# Patient Record
Sex: Male | Born: 1950 | Race: White | Hispanic: No | Marital: Single | Smoking: Former smoker
Health system: Southern US, Community
[De-identification: ages and names within clinical notes are randomized; demographics above are authoritative.]

## PROBLEM LIST (undated history)

## (undated) DIAGNOSIS — E119 Type 2 diabetes mellitus without complications: Secondary | ICD-10-CM

## (undated) DIAGNOSIS — F1010 Alcohol abuse, uncomplicated: Secondary | ICD-10-CM

## (undated) DIAGNOSIS — L409 Psoriasis, unspecified: Secondary | ICD-10-CM

## (undated) DIAGNOSIS — I10 Essential (primary) hypertension: Secondary | ICD-10-CM

## (undated) DIAGNOSIS — F1994 Other psychoactive substance use, unspecified with psychoactive substance-induced mood disorder: Secondary | ICD-10-CM

## (undated) DIAGNOSIS — M5430 Sciatica, unspecified side: Secondary | ICD-10-CM

## (undated) DIAGNOSIS — K047 Periapical abscess without sinus: Secondary | ICD-10-CM

## (undated) HISTORY — DX: Psoriasis, unspecified: L40.9

## (undated) HISTORY — DX: Other psychoactive substance use, unspecified with psychoactive substance-induced mood disorder: F19.94

## (undated) HISTORY — DX: Sciatica, unspecified side: M54.30

## (undated) HISTORY — DX: Type 2 diabetes mellitus without complications: E11.9

## (undated) HISTORY — DX: Alcohol abuse, uncomplicated: F10.10

## (undated) HISTORY — DX: Periapical abscess without sinus: K04.7

---
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
## 1978-04-28 HISTORY — PX: VASECTOMY: SHX75

## 2005-01-16 ENCOUNTER — Emergency Department: Payer: Self-pay | Admitting: Emergency Medicine

## 2005-01-16 ENCOUNTER — Other Ambulatory Visit: Payer: Self-pay

## 2006-12-04 ENCOUNTER — Emergency Department: Payer: Self-pay | Admitting: Emergency Medicine

## 2007-08-19 ENCOUNTER — Emergency Department: Payer: Self-pay | Admitting: Emergency Medicine

## 2007-08-24 ENCOUNTER — Emergency Department: Payer: Self-pay | Admitting: Emergency Medicine

## 2011-08-04 ENCOUNTER — Emergency Department: Payer: Self-pay

## 2011-08-04 LAB — CBC
HCT: 47.4 % (ref 40.0–52.0)
MCH: 29.8 pg (ref 26.0–34.0)
MCHC: 34 g/dL (ref 32.0–36.0)
Platelet: 249 10*3/uL (ref 150–440)
WBC: 9.9 10*3/uL (ref 3.8–10.6)

## 2011-08-04 LAB — URINALYSIS, COMPLETE
Bacteria: NONE SEEN
Bilirubin,UR: NEGATIVE
Blood: NEGATIVE
Glucose,UR: NEGATIVE mg/dL
Ketone: NEGATIVE
Leukocyte Esterase: NEGATIVE
Nitrite: NEGATIVE
Ph: 5
Protein: 100
RBC,UR: 1 /HPF
Specific Gravity: 1.021
Squamous Epithelial: NONE SEEN
WBC UR: 3 /HPF

## 2011-08-04 LAB — COMPREHENSIVE METABOLIC PANEL
Alkaline Phosphatase: 104 U/L (ref 50–136)
BUN: 15 mg/dL (ref 7–18)
Bilirubin,Total: 0.7 mg/dL (ref 0.2–1.0)
Calcium, Total: 9 mg/dL (ref 8.5–10.1)
Co2: 24 mmol/L (ref 21–32)
EGFR (African American): >60
EGFR (Non-African Amer.): >60
Osmolality: 274 (ref 275–301)
Potassium: 3.8 mmol/L (ref 3.5–5.1)
SGOT(AST): 61 U/L — ABNORMAL HIGH (ref 15–37)
SGPT (ALT): 91 U/L — ABNORMAL HIGH

## 2011-08-04 LAB — DRUG SCREEN, URINE
Amphetamines, Ur Screen: NEGATIVE (ref ?–1000)
Barbiturates, Ur Screen: NEGATIVE (ref ?–200)
Benzodiazepine, Ur Scrn: NEGATIVE (ref ?–200)
Cocaine Metabolite,Ur ~~LOC~~: NEGATIVE (ref ?–300)
MDMA (Ecstasy)Ur Screen: NEGATIVE (ref ?–500)
Methadone, Ur Screen: NEGATIVE (ref ?–300)
Opiate, Ur Screen: NEGATIVE (ref ?–300)
Phencyclidine (PCP) Ur S: NEGATIVE (ref ?–25)

## 2011-08-04 LAB — TSH: Thyroid Stimulating Horm: 3.16 u[IU]/mL

## 2011-08-04 LAB — ACETAMINOPHEN LEVEL: Acetaminophen: <2 ug/mL

## 2011-08-04 LAB — ETHANOL: Ethanol %: <0.003 % (ref 0.000–0.080)

## 2014-01-17 ENCOUNTER — Ambulatory Visit: Payer: Self-pay | Admitting: Urology

## 2014-01-26 ENCOUNTER — Ambulatory Visit: Payer: Self-pay | Admitting: Urology

## 2014-02-26 ENCOUNTER — Ambulatory Visit: Payer: Self-pay | Admitting: Urology

## 2014-05-04 DIAGNOSIS — L409 Psoriasis, unspecified: Secondary | ICD-10-CM | POA: Insufficient documentation

## 2014-06-08 LAB — HM DIABETES EYE EXAM

## 2014-06-27 LAB — LIPID PANEL
Cholesterol: 215 mg/dL — AB (ref 0–200)
HDL: 54 mg/dL (ref 35–70)
LDL Cholesterol: 142 mg/dL
Triglycerides: 97 mg/dL (ref 40–160)

## 2014-06-27 LAB — HEPATIC FUNCTION PANEL
ALK PHOS: 66 U/L (ref 25–125)
ALT: 13 U/L (ref 10–40)
AST: 16 U/L (ref 14–40)
Bilirubin, Direct: 0.21 mg/dL
Bilirubin, Total: 0.7 mg/dL

## 2014-06-27 LAB — HEMOGLOBIN A1C: Hemoglobin A1C: 6.2

## 2014-06-27 LAB — CBC AND DIFFERENTIAL
HEMATOCRIT: 43 % (ref 41–53)
HEMOGLOBIN: 14.6 g/dL (ref 13.5–17.5)
NEUTROS ABS: 5 /uL
Platelets: 275 10*3/uL (ref 150–399)
WBC: 9.2 10^3/mL

## 2014-06-27 LAB — TSH: TSH: 1.44 u[IU]/mL (ref <=5.90)

## 2014-08-29 ENCOUNTER — Ambulatory Visit: Payer: Self-pay

## 2014-08-29 DIAGNOSIS — K047 Periapical abscess without sinus: Secondary | ICD-10-CM | POA: Insufficient documentation

## 2014-08-29 HISTORY — DX: Periapical abscess without sinus: K04.7

## 2014-09-11 ENCOUNTER — Emergency Department
Admission: EM | Admit: 2014-09-11 | Discharge: 2014-09-11 | Disposition: A | Discharge status: Home / Self Care | Payer: No Typology Code available for payment source | Attending: Emergency Medicine | Admitting: Emergency Medicine

## 2014-09-11 ENCOUNTER — Encounter: Payer: Self-pay | Admitting: Emergency Medicine

## 2014-09-11 ENCOUNTER — Emergency Department: Payer: No Typology Code available for payment source

## 2014-09-11 DIAGNOSIS — S0990XA Unspecified injury of head, initial encounter: Secondary | ICD-10-CM | POA: Diagnosis present

## 2014-09-11 DIAGNOSIS — S8992XA Unspecified injury of left lower leg, initial encounter: Secondary | ICD-10-CM | POA: Diagnosis not present

## 2014-09-11 DIAGNOSIS — Z79899 Other long term (current) drug therapy: Secondary | ICD-10-CM | POA: Insufficient documentation

## 2014-09-11 DIAGNOSIS — Y92410 Unspecified street and highway as the place of occurrence of the external cause: Secondary | ICD-10-CM | POA: Diagnosis not present

## 2014-09-11 DIAGNOSIS — Y998 Other external cause status: Secondary | ICD-10-CM | POA: Diagnosis not present

## 2014-09-11 DIAGNOSIS — G8929 Other chronic pain: Secondary | ICD-10-CM | POA: Insufficient documentation

## 2014-09-11 DIAGNOSIS — Y9389 Activity, other specified: Secondary | ICD-10-CM | POA: Diagnosis not present

## 2014-09-11 DIAGNOSIS — S0003XA Contusion of scalp, initial encounter: Secondary | ICD-10-CM

## 2014-09-11 DIAGNOSIS — S3992XA Unspecified injury of lower back, initial encounter: Secondary | ICD-10-CM | POA: Insufficient documentation

## 2014-09-11 DIAGNOSIS — Z791 Long term (current) use of non-steroidal anti-inflammatories (NSAID): Secondary | ICD-10-CM | POA: Diagnosis not present

## 2014-09-11 DIAGNOSIS — Z87891 Personal history of nicotine dependence: Secondary | ICD-10-CM | POA: Diagnosis not present

## 2014-09-11 DIAGNOSIS — I10 Essential (primary) hypertension: Secondary | ICD-10-CM | POA: Diagnosis not present

## 2014-09-11 HISTORY — DX: Essential (primary) hypertension: I10

## 2014-09-11 MED ORDER — NAPROXEN 500 MG PO TABS: 500.0000 mg | ORAL_TABLET | Freq: Two times a day (BID) | ORAL | Status: AC

## 2014-09-11 MED ORDER — CYCLOBENZAPRINE HCL 10 MG PO TABS: 10.0000 mg | ORAL_TABLET | Freq: Three times a day (TID) | ORAL | Status: DC | PRN
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
## 2014-10-10 ENCOUNTER — Other Ambulatory Visit: Payer: Self-pay

## 2014-10-17 ENCOUNTER — Other Ambulatory Visit: Payer: Self-pay

## 2014-10-19 ENCOUNTER — Ambulatory Visit: Payer: Self-pay

## 2015-03-01 ENCOUNTER — Ambulatory Visit: Payer: Self-pay

## 2015-03-06 ENCOUNTER — Ambulatory Visit: Payer: Self-pay

## 2015-03-06 DIAGNOSIS — I129 Hypertensive chronic kidney disease with stage 1 through stage 4 chronic kidney disease, or unspecified chronic kidney disease: Secondary | ICD-10-CM | POA: Insufficient documentation

## 2015-04-03 ENCOUNTER — Other Ambulatory Visit: Payer: Self-pay

## 2015-04-03 LAB — BASIC METABOLIC PANEL
Creatinine: 1.4 mg/dL — AB (ref 0.6–1.3)
Glucose: 141 mg/dL
Potassium: 4.1 mmol/L (ref 3.4–5.3)
Sodium: 137 mmol/L (ref 137–147)

## 2015-04-11 ENCOUNTER — Ambulatory Visit: Payer: Self-pay | Admitting: Internal Medicine

## 2015-06-08 DIAGNOSIS — L409 Psoriasis, unspecified: Secondary | ICD-10-CM

## 2015-06-08 DIAGNOSIS — K047 Periapical abscess without sinus: Secondary | ICD-10-CM

## 2015-06-08 DIAGNOSIS — I10 Essential (primary) hypertension: Secondary | ICD-10-CM

## 2015-06-27 ENCOUNTER — Telehealth: Payer: Self-pay

## 2015-06-27 NOTE — Telephone Encounter (Signed)
Dr. Candelaria Stagers will not be here on 07/11/15 and we need to reschedule to another day.

## 2015-06-28 NOTE — Telephone Encounter (Signed)
Rescheduled 3/15 apt for 3/29. Reminded pt of 3/8 labs

## 2015-07-04 ENCOUNTER — Other Ambulatory Visit: Payer: Self-pay

## 2015-07-11 ENCOUNTER — Ambulatory Visit: Payer: Self-pay | Admitting: Internal Medicine

## 2015-07-25 ENCOUNTER — Ambulatory Visit: Payer: Self-pay | Admitting: Internal Medicine

## 2015-08-02 ENCOUNTER — Telehealth: Payer: Self-pay

## 2015-08-02 NOTE — Telephone Encounter (Signed)
Pt lm to resched missed lab appt and also wants to sched office visit  [phone]

## 2015-08-02 NOTE — Telephone Encounter (Signed)
Lm to schedule appointments [phone]

## 2015-08-03 NOTE — Telephone Encounter (Signed)
Lm to schedule appointments [phone] 

## 2015-11-21 ENCOUNTER — Ambulatory Visit: Payer: Self-pay | Admitting: Internal Medicine

## 2015-11-21 VITALS — BP 144/97 | HR 88 | Temp 97.0°F | Wt 216.0 lb

## 2015-11-21 DIAGNOSIS — E119 Type 2 diabetes mellitus without complications: Secondary | ICD-10-CM

## 2015-11-21 NOTE — Progress Notes (Signed)
   Subjective:    Patient ID: David Blackburn, male    DOB: 05-Jan-1951, 65 y.o.   MRN: [id number]  HPI  Patient Active Problem List   Diagnosis Date Noted  . HTN (hypertension) 03/06/2015  . Dental abscess 08/29/2014  . Psoriasis 05/04/2014   Patient states he is eating better and no longer using alcohol.  Patient is no longer taking his insulin.  He is taking gabapentin for leg cramps.  Review of Systems Patient's blood glucose levels are high.  Blood pressure is elevated. Rechecked with reading of 120/80 Patients feet were checked.  Good pulses.    Objective:   Physical Exam  Constitutional: He is oriented to person, place, and time.  Cardiovascular: Normal rate, regular rhythm and normal heart sounds.   Pulmonary/Chest: Effort normal and breath sounds normal.  Neurological: He is alert and oriented to person, place, and time.      Medication List       Accurate as of 11/21/15 11:03 AM. Always use your most recent med list.          aspirin 81 MG tablet Take 81 mg by mouth as needed for pain.   Cinnamon 500 MG capsule Take 500 mg by mouth daily.   cyclobenzaprine 10 MG tablet Commonly known as:  FLEXERIL Take 1 tablet (10 mg total) by mouth 3 (three) times daily as needed for muscle spasms.   FLAX SEEDS PO Take by mouth.   FOLIC ACID PO Take by mouth.   gabapentin 300 MG capsule Commonly known as:  NEURONTIN Take 300 mg by mouth 3 (three) times daily.   geriatric multivitamins-minerals Elix Take 15 mLs by mouth daily.   MULTIVITAMIN ADULT PO Take by mouth.   lisinopril 10 MG tablet Commonly known as:  PRINIVIL,ZESTRIL Take 10 mg by mouth daily.   NIACIN (ANTIHYPERLIPIDEMIC) PO Take by mouth.   VITAMIN D (CHOLECALCIFEROL) PO Take by mouth.      BP (!) 144/97   Pulse 88   Temp 97 F (36.1 C)   Wt 216 lb (98 kg)   BMI 31.90 kg/m        Assessment & Plan:  Patient needs to eat healthy to see if glucose levels are lower.  He is to return  in 3 weeks with labs prior to determine course medications for diabetes. MetC, CBC, A1C, Lipid, TSH, UA, Hep C

## 2015-11-21 NOTE — Patient Instructions (Signed)
MetC, CBC, A1C, Lipid, TSH, UA, Hep C

## 2015-12-12 ENCOUNTER — Other Ambulatory Visit: Payer: Self-pay

## 2015-12-12 DIAGNOSIS — E119 Type 2 diabetes mellitus without complications: Secondary | ICD-10-CM

## 2015-12-14 LAB — CBC WITH DIFFERENTIAL/PLATELET
BASOS ABS: 0.1 10*3/uL (ref 0.0–0.2)
Basos: 1 %
EOS (ABSOLUTE): 0.2 10*3/uL (ref 0.0–0.4)
Eos: 3 %
Hematocrit: 48.9 % (ref 37.5–51.0)
Hemoglobin: 16.6 g/dL (ref 12.6–17.7)
Immature Grans (Abs): 0 10*3/uL (ref 0.0–0.1)
Immature Granulocytes: 0 %
LYMPHS ABS: 2.4 10*3/uL (ref 0.7–3.1)
Lymphs: 32 %
MCH: 29.9 pg (ref 26.6–33.0)
MCHC: 33.9 g/dL (ref 31.5–35.7)
MCV: 88 fL (ref 79–97)
Monocytes Absolute: 0.5 10*3/uL (ref 0.1–0.9)
Monocytes: 7 %
Neutrophils Absolute: 4.3 10*3/uL (ref 1.4–7.0)
Neutrophils: 57 %
Platelets: 250 10*3/uL (ref 150–379)
RBC: 5.55 x10E6/uL (ref 4.14–5.80)
RDW: 14.1 % (ref 12.3–15.4)
WBC: 7.5 10*3/uL (ref 3.4–10.8)

## 2015-12-14 LAB — URINALYSIS
Bilirubin, UA: NEGATIVE
KETONES UA: NEGATIVE
LEUKOCYTES UA: NEGATIVE
Nitrite, UA: NEGATIVE
RBC UA: NEGATIVE
UUROB: 0.2 mg/dL (ref 0.2–1.0)
pH, UA: 5.5 (ref 5.0–7.5)

## 2015-12-14 LAB — COMPREHENSIVE METABOLIC PANEL
ALK PHOS: 140 IU/L — AB (ref 39–117)
ALT: 283 IU/L — ABNORMAL HIGH (ref 0–44)
AST: 242 IU/L — ABNORMAL HIGH (ref 0–40)
Albumin/Globulin Ratio: 1.7 (ref 1.2–2.2)
Albumin: 4.8 g/dL (ref 3.6–4.8)
BILIRUBIN TOTAL: 0.9 mg/dL (ref 0.0–1.2)
BUN / CREAT RATIO: 13 (ref 10–24)
BUN: 15 mg/dL (ref 8–27)
CHLORIDE: 94 mmol/L — AB (ref 96–106)
CO2: 21 mmol/L (ref 18–29)
Calcium: 9.8 mg/dL (ref 8.6–10.2)
Creatinine, Ser: 1.13 mg/dL (ref 0.76–1.27)
GFR calc Af Amer: 80 mL/min/{1.73_m2} (ref >59)
GFR calc non Af Amer: 69 mL/min/{1.73_m2} (ref >59)
GLOBULIN, TOTAL: 2.9 g/dL (ref 1.5–4.5)
GLUCOSE: 325 mg/dL — AB (ref 65–99)
Potassium: 4.6 mmol/L (ref 3.5–5.2)
SODIUM: 134 mmol/L (ref 134–144)
Total Protein: 7.7 g/dL (ref 6.0–8.5)

## 2015-12-14 LAB — HEMOGLOBIN A1C
Est. average glucose Bld gHb Est-mCnc: 349 mg/dL
Hgb A1c MFr Bld: 13.8 % — ABNORMAL HIGH (ref 4.8–5.6)

## 2015-12-14 LAB — LIPID PANEL
Chol/HDL Ratio: 10.2 ratio units — ABNORMAL HIGH (ref 0.0–5.0)
Cholesterol, Total: 266 mg/dL — ABNORMAL HIGH (ref 100–199)
HDL: 26 mg/dL — ABNORMAL LOW (ref >39)
LDL Calculated: 184 mg/dL — ABNORMAL HIGH (ref 0–99)
Triglycerides: 281 mg/dL — ABNORMAL HIGH (ref 0–149)
VLDL CHOLESTEROL CAL: 56 mg/dL — AB (ref 5–40)

## 2015-12-14 LAB — TSH: TSH: 1.81 u[IU]/mL (ref 0.450–4.500)

## 2015-12-14 LAB — HEPATITIS C ANTIBODY: Hep C Virus Ab: <0.1 s/co ratio (ref 0.0–0.9)

## 2015-12-19 ENCOUNTER — Encounter: Payer: Self-pay | Admitting: Internal Medicine

## 2015-12-19 ENCOUNTER — Ambulatory Visit: Payer: Self-pay | Admitting: Internal Medicine

## 2015-12-19 VITALS — BP 112/78 | HR 87 | Temp 97.9°F | Wt 212.0 lb

## 2015-12-19 DIAGNOSIS — E119 Type 2 diabetes mellitus without complications: Secondary | ICD-10-CM

## 2015-12-19 LAB — GLUCOSE, POCT (MANUAL RESULT ENTRY): POC Glucose: 361 mg/dl — AB (ref 70–99)

## 2015-12-19 MED ORDER — LISINOPRIL 10 MG PO TABS: 20.0000 mg | ORAL_TABLET | Freq: Every day | ORAL | 2 refills | Status: DC

## 2015-12-19 MED ORDER — INSULIN DETEMIR 100 UNIT/ML FLEXPEN: 8.0000 [IU] | PEN_INJECTOR | Freq: Every day | SUBCUTANEOUS | 2 refills | Status: DC

## 2015-12-19 MED ORDER — GABAPENTIN 300 MG PO CAPS: 300.0000 mg | ORAL_CAPSULE | Freq: Three times a day (TID) | ORAL | 6 refills | Status: DC

## 2015-12-19 NOTE — Patient Instructions (Signed)
F/u in 6 weeks w/ labs: A1C, POTC glucose, and Liver A panel

## 2015-12-19 NOTE — Progress Notes (Signed)
   Subjective:    Patient ID: David Blackburn, male    DOB: 06/17/1950, 65 y.o.   MRN: [id number]  HPI   Pt. Presents w/ f/u for diabetes. Glucose is elevated to 361. A1C elevated to 13.8. Reports has checked sugars before in evening. BP is stable. In past has responded to insulin therapy.    Pt. Reports frequent alcohol consumption. Liver functions are elevated and appears to be relatively new.  Hep C is negative.  Reports consistent coughing related to BP medication.   Patient Active Problem List   Diagnosis Date Noted  . HTN (hypertension) 03/06/2015  . Dental abscess 08/29/2014  . Psoriasis 05/04/2014     Medication List       Accurate as of 12/19/15 10:29 AM. Always use your most recent med list.          aspirin 81 MG tablet Take 81 mg by mouth as needed for pain.   Cinnamon 500 MG capsule Take 500 mg by mouth daily.   cyclobenzaprine 10 MG tablet Commonly known as:  FLEXERIL Take 1 tablet (10 mg total) by mouth 3 (three) times daily as needed for muscle spasms.   FLAX SEEDS PO Take by mouth.   FOLIC ACID PO Take by mouth.   gabapentin 300 MG capsule Commonly known as:  NEURONTIN Take 300 mg by mouth 3 (three) times daily.   geriatric multivitamins-minerals Elix Take 15 mLs by mouth daily.   MULTIVITAMIN ADULT PO Take by mouth.   lisinopril 10 MG tablet Commonly known as:  PRINIVIL,ZESTRIL Take 10 mg by mouth daily.   NIACIN (ANTIHYPERLIPIDEMIC) PO Take by mouth.   VITAMIN D (CHOLECALCIFEROL) PO Take by mouth.        Review of Systems     Objective:   Physical Exam  Constitutional: He is oriented to person, place, and time.  Cardiovascular: Normal rate, regular rhythm and normal heart sounds.   Pulmonary/Chest: Effort normal and breath sounds normal.  Neurological: He is alert and oriented to person, place, and time.    BP 112/78   Pulse 87   Temp 97.9 F (36.6 C)   Wt 212 lb (96.2 kg)   BMI 31.31 kg/m        Assessment & Plan:    Restart insulin at 8 units nightly.  F/u in 6 weeks w/ labs: A1C, random sugar, liver A panel  Pt. Advised to decrease alcohol consumption.

## 2016-01-30 ENCOUNTER — Ambulatory Visit: Payer: Self-pay | Admitting: Internal Medicine

## 2016-01-30 ENCOUNTER — Encounter: Payer: Self-pay | Admitting: Internal Medicine

## 2016-01-30 VITALS — BP 124/85 | HR 76 | Temp 97.4°F | Wt 214.0 lb

## 2016-01-30 DIAGNOSIS — E119 Type 2 diabetes mellitus without complications: Secondary | ICD-10-CM

## 2016-01-30 DIAGNOSIS — M5441 Lumbago with sciatica, right side: Secondary | ICD-10-CM

## 2016-01-30 DIAGNOSIS — Z794 Long term (current) use of insulin: Principal | ICD-10-CM

## 2016-01-30 DIAGNOSIS — M5442 Lumbago with sciatica, left side: Secondary | ICD-10-CM

## 2016-01-30 LAB — GLUCOSE, POCT (MANUAL RESULT ENTRY): POC GLUCOSE: 261 mg/dL — AB (ref 70–99)

## 2016-01-30 MED ORDER — GABAPENTIN 300 MG PO CAPS: 300.0000 mg | ORAL_CAPSULE | Freq: Three times a day (TID) | ORAL | 6 refills | Status: DC

## 2016-01-30 MED ORDER — LISINOPRIL 10 MG PO TABS: 20.0000 mg | ORAL_TABLET | Freq: Every day | ORAL | 2 refills | Status: DC

## 2016-01-30 MED ORDER — INSULIN DETEMIR 100 UNIT/ML FLEXPEN: 8.0000 [IU] | PEN_INJECTOR | Freq: Every day | SUBCUTANEOUS | 2 refills | Status: DC

## 2016-01-30 NOTE — Progress Notes (Signed)
   Subjective:    Patient ID: David Blackburn, male    DOB: 03/18/1951, 65 y.o.   MRN: [id number]  Diabetes   Leg Pain     Pt f/u for diabetes and leg pain. Reports checking blood glucose and reports it in the range of 200-300. Reports not taking insulin b/c did not get refill. Blood glucose elevated today at 261. Reports recent increase in consumption of alcohol which is apparent in lab results in last 3 mnths. 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 Pt reports previous diagnosis of bipolar disease.    Patient Active Problem List   Diagnosis Date Noted  . HTN (hypertension) 03/06/2015  . Dental abscess 08/29/2014  . Psoriasis 05/04/2014     Medication List       Accurate as of 01/30/16 11:04 AM. Always use your most recent med list.          aspirin 81 MG tablet Take 81 mg by mouth as needed for pain.   Cinnamon 500 MG capsule Take 500 mg by mouth daily.   cyclobenzaprine 10 MG tablet Commonly known as:  FLEXERIL Take 1 tablet (10 mg total) by mouth 3 (three) times daily as needed for muscle spasms.   FLAX SEEDS PO Take by mouth.   FOLIC ACID PO Take by mouth.   gabapentin 300 MG capsule Commonly known as:  NEURONTIN Take 1 capsule (300 mg total) by mouth 3 (three) times daily.   geriatric multivitamins-minerals Elix Take 15 mLs by mouth daily.   MULTIVITAMIN ADULT PO Take by mouth.   Insulin Detemir 100 UNIT/ML Pen Commonly known as:  LEVEMIR FLEXPEN Inject 8 Units into the skin daily at 10 pm.   lisinopril 10 MG tablet Commonly known as:  PRINIVIL,ZESTRIL Take 2 tablets (20 mg total) by mouth daily.   NIACIN (ANTIHYPERLIPIDEMIC) PO Take by mouth as needed.   VITAMIN D (CHOLECALCIFEROL) PO Take by mouth.        Review of Systems     Objective:   Physical Exam  Constitutional: He is oriented to person, place, and time.  Cardiovascular: Normal rate, regular rhythm and normal heart sounds.   Pulmonary/Chest: Effort normal and breath sounds  normal.  Neurological: He is alert and oriented to person, place, and time.    BP 124/85   Pulse 76   Temp 97.4 F (36.3 C) (Oral)   Wt 214 lb (97.1 kg)   BMI 31.60 kg/m        Assessment & Plan:   Pt advised on reducing alcohol consumption.  Referral to orthopedic for back pain. Referral for lumbar sacral x-ray for back pain and sciatica.  F/u in 3 mnths w/ labs: A1C, MetC, CBC, Lipid

## 2016-01-30 NOTE — Patient Instructions (Signed)
Referral to orthopedic for back pain. Referral for lumbar sacral x-ray for back pain and sciatica.  F/u in 3 mnths w/ labs: A1C, MetC, CBC, Lipid

## 2016-02-06 ENCOUNTER — Encounter: Payer: Self-pay | Admitting: Specialist

## 2016-02-07 ENCOUNTER — Other Ambulatory Visit: Payer: Self-pay | Admitting: Specialist

## 2016-02-07 DIAGNOSIS — M5416 Radiculopathy, lumbar region: Secondary | ICD-10-CM

## 2016-02-20 ENCOUNTER — Ambulatory Visit: Payer: No Typology Code available for payment source

## 2016-02-21 ENCOUNTER — Ambulatory Visit: Payer: Self-pay

## 2016-03-19 ENCOUNTER — Other Ambulatory Visit: Payer: Self-pay

## 2016-03-19 DIAGNOSIS — I10 Essential (primary) hypertension: Secondary | ICD-10-CM

## 2016-03-19 MED ORDER — LISINOPRIL 10 MG PO TABS: 20.0000 mg | ORAL_TABLET | Freq: Every day | ORAL | 2 refills | Status: DC

## 2016-03-19 MED ORDER — GABAPENTIN 300 MG PO CAPS: 300.0000 mg | ORAL_CAPSULE | Freq: Three times a day (TID) | ORAL | 6 refills | Status: DC

## 2016-04-30 ENCOUNTER — Other Ambulatory Visit: Payer: Self-pay

## 2016-04-30 DIAGNOSIS — E119 Type 2 diabetes mellitus without complications: Secondary | ICD-10-CM

## 2016-04-30 DIAGNOSIS — Z794 Long term (current) use of insulin: Secondary | ICD-10-CM

## 2016-05-01 LAB — CBC
Hematocrit: 44 % (ref 37.5–51.0)
Hemoglobin: 15.2 g/dL (ref 13.0–17.7)
MCH: 30.2 pg (ref 26.6–33.0)
MCHC: 34.5 g/dL (ref 31.5–35.7)
MCV: 87 fL (ref 79–97)
PLATELETS: 224 10*3/uL (ref 150–379)
RBC: 5.04 x10E6/uL (ref 4.14–5.80)
RDW: 14 % (ref 12.3–15.4)
WBC: 8.1 10*3/uL (ref 3.4–10.8)

## 2016-05-01 LAB — COMPREHENSIVE METABOLIC PANEL
ALBUMIN: 4.7 g/dL (ref 3.6–4.8)
ALT: 129 IU/L — ABNORMAL HIGH (ref 0–44)
AST: 105 IU/L — ABNORMAL HIGH (ref 0–40)
Albumin/Globulin Ratio: 2 (ref 1.2–2.2)
Alkaline Phosphatase: 91 IU/L (ref 39–117)
BUN/Creatinine Ratio: 16 (ref 10–24)
BUN: 14 mg/dL (ref 8–27)
Bilirubin Total: 1.2 mg/dL (ref 0.0–1.2)
CALCIUM: 9.7 mg/dL (ref 8.6–10.2)
CO2: 20 mmol/L (ref 18–29)
CREATININE: 0.87 mg/dL (ref 0.76–1.27)
Chloride: 100 mmol/L (ref 96–106)
GFR, EST AFRICAN AMERICAN: 106 mL/min/{1.73_m2} (ref >59)
GFR, EST NON AFRICAN AMERICAN: 92 mL/min/{1.73_m2} (ref >59)
GLOBULIN, TOTAL: 2.4 g/dL (ref 1.5–4.5)
GLUCOSE: 139 mg/dL — AB (ref 65–99)
Potassium: 4.1 mmol/L (ref 3.5–5.2)
SODIUM: 139 mmol/L (ref 134–144)
TOTAL PROTEIN: 7.1 g/dL (ref 6.0–8.5)

## 2016-05-01 LAB — LIPID PANEL
CHOLESTEROL TOTAL: 225 mg/dL — AB (ref 100–199)
Chol/HDL Ratio: 7.3 ratio units — ABNORMAL HIGH (ref 0.0–5.0)
HDL: 31 mg/dL — ABNORMAL LOW (ref >39)
LDL Calculated: 158 mg/dL — ABNORMAL HIGH (ref 0–99)
Triglycerides: 181 mg/dL — ABNORMAL HIGH (ref 0–149)
VLDL CHOLESTEROL CAL: 36 mg/dL (ref 5–40)

## 2016-05-01 LAB — HEMOGLOBIN A1C
ESTIMATED AVERAGE GLUCOSE: 189 mg/dL
HEMOGLOBIN A1C: 8.2 % — AB (ref 4.8–5.6)

## 2016-05-01 LAB — HEPATIC FUNCTION PANEL: Bilirubin, Direct: 0.26 mg/dL (ref 0.00–0.40)

## 2016-05-07 ENCOUNTER — Encounter: Payer: Self-pay | Admitting: Internal Medicine

## 2016-05-07 ENCOUNTER — Ambulatory Visit: Payer: Self-pay | Admitting: Internal Medicine

## 2016-05-07 VITALS — BP 145/87 | HR 71 | Temp 97.7°F | Wt 210.0 lb

## 2016-05-07 DIAGNOSIS — I10 Essential (primary) hypertension: Secondary | ICD-10-CM

## 2016-05-07 DIAGNOSIS — Z794 Long term (current) use of insulin: Principal | ICD-10-CM

## 2016-05-07 DIAGNOSIS — E119 Type 2 diabetes mellitus without complications: Secondary | ICD-10-CM

## 2016-05-07 MED ORDER — LISINOPRIL 10 MG PO TABS: 20.0000 mg | ORAL_TABLET | Freq: Every day | ORAL | 7 refills | Status: DC

## 2016-05-07 MED ORDER — GABAPENTIN 300 MG PO CAPS: 300.0000 mg | ORAL_CAPSULE | Freq: Three times a day (TID) | ORAL | 11 refills | Status: DC

## 2016-05-07 NOTE — Patient Instructions (Signed)
F/u in 6 months w/ labs 

## 2016-05-07 NOTE — Progress Notes (Signed)
  Subjective:    Patient ID: David Blackburn, male    DOB: 07/13/1950, 66 y.o.   MRN: [id number]  HPI   Pt f/u for lab work. A1C has decreased to 8.   Patient Active Problem List   Diagnosis Date Noted  . HTN (hypertension) 03/06/2015  . Dental abscess 08/29/2014  . Psoriasis 05/04/2014   Allergies as of 05/07/2016   No Known Allergies     Medication List       Accurate as of 05/07/16 10:26 AM. Always use your most recent med list.          aspirin 81 MG tablet Take 81 mg by mouth as needed for pain.   Cinnamon 500 MG capsule Take 500 mg by mouth daily.   cyclobenzaprine 10 MG tablet Commonly known as:  FLEXERIL Take 1 tablet (10 mg total) by mouth 3 (three) times daily as needed for muscle spasms.   FLAX SEEDS PO Take by mouth.   FOLIC ACID PO Take by mouth.   gabapentin 300 MG capsule Commonly known as:  NEURONTIN Take 1 capsule (300 mg total) by mouth 3 (three) times daily.   geriatric multivitamins-minerals Elix Take 15 mLs by mouth daily.   MULTIVITAMIN ADULT PO Take by mouth.   Insulin Detemir 100 UNIT/ML Pen Commonly known as:  LEVEMIR FLEXPEN Inject 8 Units into the skin daily at 10 pm.   lisinopril 10 MG tablet Commonly known as:  PRINIVIL,ZESTRIL Take 2 tablets (20 mg total) by mouth daily.   NIACIN (ANTIHYPERLIPIDEMIC) PO Take by mouth as needed.   VITAMIN D (CHOLECALCIFEROL) PO Take by mouth.         Review of Systems     Objective:   Physical Exam  Constitutional: He is oriented to person, place, and time.  Cardiovascular: Normal rate, regular rhythm and normal heart sounds.   Pulmonary/Chest: Effort normal and breath sounds normal.  Neurological: He is alert and oriented to person, place, and time.    BP (!) 145/87   Pulse 71   Temp 97.7 F (36.5 C) (Oral)   Wt 210 lb (95.3 kg)   BMI 31.01 kg/m        Assessment & Plan:   F/u in 6 months w/ labs: Met C, CBC, A1C, Lipid, Ua, MicroAlbumin, PSA, TSH

## 2016-11-05 ENCOUNTER — Ambulatory Visit: Payer: Self-pay | Admitting: Internal Medicine

## 2016-11-05 ENCOUNTER — Encounter: Payer: Self-pay | Admitting: Internal Medicine

## 2016-11-05 VITALS — BP 155/107 | HR 73 | Temp 97.6°F | Wt 210.8 lb

## 2016-11-05 DIAGNOSIS — I10 Essential (primary) hypertension: Secondary | ICD-10-CM

## 2016-11-05 MED ORDER — GABAPENTIN 300 MG PO CAPS: 300.0000 mg | ORAL_CAPSULE | Freq: Three times a day (TID) | ORAL | 11 refills | Status: DC

## 2016-11-05 MED ORDER — LOSARTAN POTASSIUM 50 MG PO TABS: 50.0000 mg | ORAL_TABLET | Freq: Every day | ORAL | 11 refills | Status: DC

## 2016-11-05 NOTE — Progress Notes (Signed)
   Subjective:    Patient ID: David Blackburn, male    DOB: 02/04/1951, 66 y.o.   MRN: [id number]  HPI   Pt reports he stopped taking Lisinopril due to persistent cough      Patient Active Problem List   Diagnosis Date Noted  . HTN (hypertension) 03/06/2015  . Dental abscess 08/29/2014  . Psoriasis 05/04/2014    Allergies as of 11/05/2016   No Known Allergies     Medication List       Accurate as of 11/05/16  9:27 AM. Always use your most recent med list.          aspirin 81 MG tablet Take 81 mg by mouth as needed for pain.   Cinnamon 500 MG capsule Take 500 mg by mouth daily.   cyclobenzaprine 10 MG tablet Commonly known as:  FLEXERIL Take 1 tablet (10 mg total) by mouth 3 (three) times daily as needed for muscle spasms.   FLAX SEEDS PO Take by mouth.   FLUVIRIN Susp Generic drug:  Influenza Vac Typ A&B Surf Ant ADM 4.1HR IM UTD   FOLIC ACID PO Take by mouth.   gabapentin 300 MG capsule Commonly known as:  NEURONTIN Take 1 capsule (300 mg total) by mouth 3 (three) times daily.   geriatric multivitamins-minerals Elix Take 15 mLs by mouth daily.   MULTIVITAMIN ADULT PO Take by mouth.   Insulin Detemir 100 UNIT/ML Pen Commonly known as:  LEVEMIR FLEXPEN Inject 8 Units into the skin daily at 10 pm.   lisinopril 10 MG tablet Commonly known as:  PRINIVIL,ZESTRIL Take 2 tablets (20 mg total) by mouth daily.   NIACIN (ANTIHYPERLIPIDEMIC) PO Take by mouth as needed.   VITAMIN D (CHOLECALCIFEROL) PO Take by mouth.         Review of Systems     Objective:   Physical Exam  Constitutional: He is oriented to person, place, and time.  Cardiovascular: Normal rate, regular rhythm and normal heart sounds.   Pulmonary/Chest: Effort normal and breath sounds normal.  Neurological: He is alert and oriented to person, place, and time.     BP (!) 155/107 (BP Location: Left Arm, Patient Position: Sitting, Cuff Size: Normal)   Pulse 73   Temp 97.6 F  (36.4 C)   Wt 210 lb 12.8 oz (95.6 kg)   BMI 31.13 kg/m        Assessment & Plan:   Start taking Losartin '50mg'$  once a day  Continue taking Gabapentin  Labs Today: A1C, Met C, CBC, UA, Lipid, TSH, PSA    Follow up in 3-4 wks

## 2016-11-06 LAB — COMPREHENSIVE METABOLIC PANEL
ALBUMIN: 4.5 g/dL (ref 3.6–4.8)
ALT: 43 IU/L (ref 0–44)
AST: 35 IU/L (ref 0–40)
Albumin/Globulin Ratio: 1.7 (ref 1.2–2.2)
Alkaline Phosphatase: 114 IU/L (ref 39–117)
BUN/Creatinine Ratio: 10 (ref 10–24)
BUN: 11 mg/dL (ref 8–27)
Bilirubin Total: 0.6 mg/dL (ref 0.0–1.2)
CALCIUM: 9.9 mg/dL (ref 8.6–10.2)
CO2: 23 mmol/L (ref 20–29)
CREATININE: 1.12 mg/dL (ref 0.76–1.27)
Chloride: 100 mmol/L (ref 96–106)
GFR calc Af Amer: 80 mL/min/{1.73_m2} (ref >59)
GFR calc non Af Amer: 69 mL/min/{1.73_m2} (ref >59)
GLOBULIN, TOTAL: 2.7 g/dL (ref 1.5–4.5)
Glucose: 140 mg/dL — ABNORMAL HIGH (ref 65–99)
Potassium: 4.6 mmol/L (ref 3.5–5.2)
SODIUM: 141 mmol/L (ref 134–144)
Total Protein: 7.2 g/dL (ref 6.0–8.5)

## 2016-11-06 LAB — CBC
HEMATOCRIT: 46.8 % (ref 37.5–51.0)
HEMOGLOBIN: 16 g/dL (ref 13.0–17.7)
MCH: 29.2 pg (ref 26.6–33.0)
MCHC: 34.2 g/dL (ref 31.5–35.7)
MCV: 85 fL (ref 79–97)
PLATELETS: 258 10*3/uL (ref 150–379)
RBC: 5.48 x10E6/uL (ref 4.14–5.80)
RDW: 12.9 % (ref 12.3–15.4)
WBC: 7.5 10*3/uL (ref 3.4–10.8)

## 2016-11-06 LAB — URINALYSIS
Bilirubin, UA: NEGATIVE
Glucose, UA: NEGATIVE
Ketones, UA: NEGATIVE
LEUKOCYTES UA: NEGATIVE
NITRITE UA: NEGATIVE
PH UA: 5 (ref 5.0–7.5)
RBC, UA: NEGATIVE
Specific Gravity, UA: 1.02 (ref 1.005–1.030)
Urobilinogen, Ur: 0.2 mg/dL (ref 0.2–1.0)

## 2016-11-06 LAB — TSH: TSH: 2.5 u[IU]/mL (ref 0.450–4.500)

## 2016-11-06 LAB — LIPID PANEL
CHOL/HDL RATIO: 5.4 ratio — AB (ref 0.0–5.0)
Cholesterol, Total: 193 mg/dL (ref 100–199)
HDL: 36 mg/dL — AB (ref >39)
LDL CALC: 125 mg/dL — AB (ref 0–99)
TRIGLYCERIDES: 158 mg/dL — AB (ref 0–149)
VLDL CHOLESTEROL CAL: 32 mg/dL (ref 5–40)

## 2016-11-06 LAB — HEMOGLOBIN A1C
Est. average glucose Bld gHb Est-mCnc: 163 mg/dL
Hgb A1c MFr Bld: 7.3 % — ABNORMAL HIGH (ref 4.8–5.6)

## 2016-11-06 LAB — PSA: Prostate Specific Ag, Serum: 1.1 ng/mL (ref 0.0–4.0)

## 2016-12-24 ENCOUNTER — Encounter: Payer: Self-pay | Admitting: Internal Medicine

## 2016-12-24 ENCOUNTER — Ambulatory Visit: Payer: Self-pay | Admitting: Internal Medicine

## 2016-12-24 ENCOUNTER — Ambulatory Visit: Payer: Self-pay | Admitting: Specialist

## 2016-12-24 VITALS — BP 158/99 | HR 71 | Temp 98.1°F | Wt 209.2 lb

## 2016-12-24 DIAGNOSIS — E119 Type 2 diabetes mellitus without complications: Secondary | ICD-10-CM

## 2016-12-24 DIAGNOSIS — M25559 Pain in unspecified hip: Secondary | ICD-10-CM

## 2016-12-24 LAB — POCT GLUCOSE (DEVICE FOR HOME USE): POC Glucose: 116 mg/dl — AB (ref 70–99)

## 2016-12-24 NOTE — Progress Notes (Signed)
M  Subjective:    Patient ID: Lorie Phenixshley Dewberry, male    DOB: 10/30/1950, 66 y.o.   MRN: [id number]  HPI I saw Mr. Corine ShelterWatkins 02/06/16. In reviewing my note, I was more interested in his low back. He was not using a cane at that time, since then he has seen Dr. Jonnie FinnerNaylor who xrayed lumbar spine and L hip. 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 LS spine shows multi level DJD.  He now uses a single point cane in alternating hands.   Review of Systems     Objective:   Physical Exam   Without cane, he has a marked antalgic gait, he almost hops. With cane in Rt hand, it is somewhat better. He is able to stand on toes, barely on his heels.   In supine position, He lacks 10 degrees of full extension. Flexion: to 40 degrees. In prone position, on R 20 degrees ER. 20 degrees IR; on L, 30 degrees ER contracture, with no further rotation possible.       Assessment & Plan:  IMP: severe DJD L hip. [facility]

## 2016-12-24 NOTE — Patient Instructions (Signed)
Follow up in 6 months with labs a week prior

## 2016-12-24 NOTE — Progress Notes (Signed)
   Subjective:    Patient ID: David Blackburn, male    DOB: 08-Apr-1951, 66 y.o.   MRN: [id number]  HPI   Pt reports being under a significant amount of stress. He has been checking his BP regularly with a sleeve. Pt reports that systolic pressure readings are low, but he thinks that the monitor may be incorrect.   Pt is interested in receiving a prescription for anxiety.   Patient Active Problem List   Diagnosis Date Noted  . HTN (hypertension) 03/06/2015  . Dental abscess 08/29/2014  . Psoriasis 05/04/2014   Allergies as of 12/24/2016   No Known Allergies     Medication List       Accurate as of 12/24/16  9:47 AM. Always use your most recent med list.          aspirin 81 MG tablet Take 81 mg by mouth as needed for pain.   Cinnamon 500 MG capsule Take 500 mg by mouth daily.   cyclobenzaprine 10 MG tablet Commonly known as:  FLEXERIL Take 1 tablet (10 mg total) by mouth 3 (three) times daily as needed for muscle spasms.   FLAX SEEDS PO Take by mouth.   FLUVIRIN Susp Generic drug:  Influenza Vac Typ A&B Surf Ant ADM 0.5ML IM UTD   FOLIC ACID PO Take by mouth.   gabapentin 300 MG capsule Commonly known as:  NEURONTIN Take 1 capsule (300 mg total) by mouth 3 (three) times daily.   geriatric multivitamins-minerals Elix Take 15 mLs by mouth daily.   MULTIVITAMIN ADULT PO Take by mouth.   Insulin Detemir 100 UNIT/ML Pen Commonly known as:  LEVEMIR FLEXPEN Inject 8 Units into the skin daily at 10 pm.   [name] tablet Commonly known as:  COZAAR Take 1 tablet (50 mg total) by mouth daily.   NIACIN (ANTIHYPERLIPIDEMIC) PO Take by mouth as needed.   VITAMIN D (CHOLECALCIFEROL) PO Take by mouth.        Review of Systems     Objective:   Physical Exam  BP (!) 158/99 (BP Location: Left Arm)   Pulse 71   Temp 98.1 F (36.7 C)   Wt 209 lb 3.2 oz (94.9 kg)   BMI 30.89 kg/m   Manual BP recorded; 138/84     Assessment & Plan:    F/U in 6  months with labs a week prior  Provide referral for mental health screening/services for anxiety

## 2016-12-25 ENCOUNTER — Ambulatory Visit: Payer: Self-pay | Admitting: Licensed Clinical Social Worker

## 2017-01-30 ENCOUNTER — Emergency Department
Admission: EM | Admit: 2017-01-30 | Discharge: 2017-01-30 | Disposition: A | Discharge status: Home / Self Care | Payer: Medicaid Other | Attending: Emergency Medicine | Admitting: Emergency Medicine

## 2017-01-30 ENCOUNTER — Encounter: Payer: Self-pay | Admitting: Emergency Medicine

## 2017-01-30 DIAGNOSIS — F1994 Other psychoactive substance use, unspecified with psychoactive substance-induced mood disorder: Secondary | ICD-10-CM

## 2017-01-30 DIAGNOSIS — I10 Essential (primary) hypertension: Secondary | ICD-10-CM | POA: Diagnosis not present

## 2017-01-30 DIAGNOSIS — F3181 Bipolar II disorder: Secondary | ICD-10-CM

## 2017-01-30 DIAGNOSIS — Y906 Blood alcohol level of 120-199 mg/100 ml: Secondary | ICD-10-CM | POA: Insufficient documentation

## 2017-01-30 DIAGNOSIS — Z7982 Long term (current) use of aspirin: Secondary | ICD-10-CM | POA: Diagnosis not present

## 2017-01-30 DIAGNOSIS — Z046 Encounter for general psychiatric examination, requested by authority: Secondary | ICD-10-CM | POA: Diagnosis present

## 2017-01-30 DIAGNOSIS — R45851 Suicidal ideations: Secondary | ICD-10-CM | POA: Insufficient documentation

## 2017-01-30 DIAGNOSIS — Z79899 Other long term (current) drug therapy: Secondary | ICD-10-CM | POA: Insufficient documentation

## 2017-01-30 DIAGNOSIS — E119 Type 2 diabetes mellitus without complications: Secondary | ICD-10-CM | POA: Insufficient documentation

## 2017-01-30 DIAGNOSIS — F1010 Alcohol abuse, uncomplicated: Secondary | ICD-10-CM | POA: Insufficient documentation

## 2017-01-30 DIAGNOSIS — Z87891 Personal history of nicotine dependence: Secondary | ICD-10-CM | POA: Insufficient documentation

## 2017-01-30 HISTORY — DX: Other psychoactive substance use, unspecified with psychoactive substance-induced mood disorder: F19.94

## 2017-01-30 HISTORY — DX: Alcohol abuse, uncomplicated: F10.10

## 2017-01-30 LAB — CBC
HEMATOCRIT: 48.1 % (ref 40.0–52.0)
HEMOGLOBIN: 16.6 g/dL (ref 13.0–18.0)
MCH: 29.7 pg (ref 26.0–34.0)
MCHC: 34.4 g/dL (ref 32.0–36.0)
MCV: 86.3 fL (ref 80.0–100.0)
Platelets: 267 10*3/uL (ref 150–440)
RBC: 5.57 MIL/uL (ref 4.40–5.90)
RDW: 15.1 % — ABNORMAL HIGH (ref 11.5–14.5)
WBC: 10.4 10*3/uL (ref 3.8–10.6)

## 2017-01-30 LAB — COMPREHENSIVE METABOLIC PANEL
ALK PHOS: 100 U/L (ref 38–126)
ALT: 54 U/L (ref 17–63)
AST: 50 U/L — ABNORMAL HIGH (ref 15–41)
Albumin: 4.9 g/dL (ref 3.5–5.0)
Anion gap: 15 (ref 5–15)
BUN: 17 mg/dL (ref 6–20)
CALCIUM: 10.1 mg/dL (ref 8.9–10.3)
CO2: 23 mmol/L (ref 22–32)
CREATININE: 1.06 mg/dL (ref 0.61–1.24)
Chloride: 103 mmol/L (ref 101–111)
GFR calc non Af Amer: >60 mL/min (ref >60)
GLUCOSE: 149 mg/dL — AB (ref 65–99)
Potassium: 4.1 mmol/L (ref 3.5–5.1)
SODIUM: 141 mmol/L (ref 135–145)
Total Bilirubin: 1.2 mg/dL (ref 0.3–1.2)
Total Protein: 8.8 g/dL — ABNORMAL HIGH (ref 6.5–8.1)

## 2017-01-30 LAB — URINE DRUG SCREEN, QUALITATIVE (ARMC ONLY)
Amphetamines, Ur Screen: NOT DETECTED
Barbiturates, Ur Screen: NOT DETECTED
Benzodiazepine, Ur Scrn: NOT DETECTED
CANNABINOID 50 NG, UR ~~LOC~~: NOT DETECTED
COCAINE METABOLITE, UR ~~LOC~~: NOT DETECTED
MDMA (Ecstasy)Ur Screen: NOT DETECTED
Methadone Scn, Ur: NOT DETECTED
OPIATE, UR SCREEN: NOT DETECTED
Phencyclidine (PCP) Ur S: NOT DETECTED
Tricyclic, Ur Screen: NOT DETECTED

## 2017-01-30 LAB — ACETAMINOPHEN LEVEL: Acetaminophen (Tylenol), Serum: <10 ug/mL — ABNORMAL LOW (ref 10–30)

## 2017-01-30 LAB — SALICYLATE LEVEL

## 2017-01-30 LAB — ETHANOL: Alcohol, Ethyl (B): 191 mg/dL — ABNORMAL HIGH (ref <10)

## 2017-01-30 MED ORDER — LORAZEPAM 2 MG/ML IJ SOLN: 0.0000 mg | Freq: Four times a day (QID) | INTRAMUSCULAR | Status: DC

## 2017-01-30 MED ORDER — LORAZEPAM 2 MG PO TABS: 0.0000 mg | ORAL_TABLET | Freq: Four times a day (QID) | ORAL | Status: DC

## 2017-01-30 MED ORDER — VITAMIN B-1 100 MG PO TABS
100.0000 mg | ORAL_TABLET | Freq: Every day | ORAL | Status: DC
  Administered 2017-01-30: 100 mg via ORAL
  Filled 2017-01-30: qty 1

## 2017-01-30 MED ORDER — THIAMINE HCL 100 MG/ML IJ SOLN: 100.0000 mg | Freq: Every day | INTRAMUSCULAR | Status: DC

## 2017-01-30 MED ORDER — LORAZEPAM 2 MG PO TABS: 0.0000 mg | ORAL_TABLET | Freq: Two times a day (BID) | ORAL | Status: DC

## 2017-01-30 MED ORDER — LORAZEPAM 2 MG/ML IJ SOLN: 0.0000 mg | Freq: Two times a day (BID) | INTRAMUSCULAR | Status: DC
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
## 2017-01-30 NOTE — ED Provider Notes (Signed)
[facility] Emergency Department Provider Note  ____________________________________________   First MD Initiated Contact with Patient 01/30/17 0505     (approximate)  I have reviewed the triage vital signs and the nursing notes.   HISTORY  Chief Complaint Suicidal    HPI David Blackburn is a 66 y.o. male with medical history as listed below who presents for evaluation of suicidal ideation.  He was brought in by Patent examiner with IVC papers in place.  He admits to drinking a large amount of alcohol daily, and states that he Had about half a bottle of rum over the course of this past day (half of a 750 mL bottle).  He is very EKG with his responses and will not answer questions directly.  He was very clear about his intention to kill himself to law enforcement stating that he would cut himself with a razor blade or hang himself cord, but to me he states that that was all a joke and that he would not kill himself.  Although he does appear slightly intoxicated he is coherent and able to deflect questioning away from answering the questions he is asked.  He denies fever/chills, chest pain, shortness of breath, nausea, vomiting, and abdominal pain.  He states he does not regularly do drugs but will "do anything I can get my hands on".  He denies smoking.   Past Medical History:  Diagnosis Date  . Diabetes mellitus without complication (HCC)   . Hypertension   . Psoriasis   . Sciatica     Patient Active Problem List   Diagnosis Date Noted  . HTN (hypertension) 03/06/2015  . Dental abscess 08/29/2014  . Psoriasis 05/04/2014    Past Surgical History:  Procedure Laterality Date  . TONSILLECTOMY  1961  . VASECTOMY  1980    Prior to Admission medications   Medication Sig Start Date End Date Taking? Authorizing Provider  aspirin 81 MG tablet Take 81 mg by mouth as needed for pain.    [provider]  Cinnamon 500 MG capsule Take 500 mg by mouth  daily.    [provider]  cyclobenzaprine (FLEXERIL) 10 MG tablet Take 1 tablet (10 mg total) by mouth 3 (three) times daily as needed for muscle spasms. Patient not taking: Reported on 05/07/2016 09/11/14   Triplett, Rulon Eisenmenger B, FNP  Flaxseed, Linseed, (FLAX SEEDS PO) Take by mouth.    [provider]  Jodie Echevaria ADM 0.5ML IM UTD 02/08/16   [provider]  FOLIC ACID PO Take by mouth.    [provider]  gabapentin (NEURONTIN) 300 MG capsule Take 1 capsule (300 mg total) by mouth 3 (three) times daily. 11/05/16   Virl Axe, MD  geriatric multivitamins-minerals (ELDERTONIC/GEVRABON) ELIX Take 15 mLs by mouth daily.    [provider]  Insulin Detemir (LEVEMIR FLEXPEN) 100 UNIT/ML Pen Inject 8 Units into the skin daily at 10 pm. Patient not taking: Reported on 05/07/2016 01/30/16   Virl Axe, MD  losartan (COZAAR) 50 MG tablet Take 1 tablet (50 mg total) by mouth daily. 11/05/16   Virl Axe, MD  Multiple Vitamins-Minerals (MULTIVITAMIN ADULT PO) Take by mouth.    [provider]  NIACIN, ANTIHYPERLIPIDEMIC, PO Take by mouth as needed.     [provider]  VITAMIN D, CHOLECALCIFEROL, PO Take by mouth.    [provider]    Allergies Patient has no known allergies.  Family History  Problem Relation Age  of Onset  . Lung cancer Mother   . Hypertension Mother     Social History Social History  Substance Use Topics  . Smoking status: Former Smoker    Packs/day: 0.01    Types: Cigars    Quit date: 06/08/2007  . Smokeless tobacco: Former Neurosurgeon     Comment: occassionally a cigar  . Alcohol use Yes     Comment: "sometimes a bottle of wine in a few hours"     Review of Systems Constitutional: No fever/chills Eyes: No visual changes. ENT: No sore throat. Cardiovascular: Denies chest pain. Respiratory: Denies shortness of breath. Gastrointestinal: No abdominal pain.  No nausea, no vomiting.  No diarrhea.  No  constipation. Genitourinary: Negative for dysuria. Musculoskeletal: Negative for neck pain.  Negative for back pain. Integumentary: Negative for rash. Neurological: Negative for headaches, focal weakness or numbness. Psychiatric:Reportedly endorsing suicidality  ____________________________________________   PHYSICAL EXAM:  VITAL SIGNS: ED Triage Vitals  Enc Vitals Group     BP 01/30/17 0424 (!) 180/83     Pulse Rate 01/30/17 0424 77     Resp 01/30/17 0424 17     Temp --      Temp src --      SpO2 01/30/17 0424 99 %     Weight 01/30/17 0425 94.8 kg (209 lb)     Height --      Head Circumference --      Peak Flow --      Pain Score --      Pain Loc --      Pain Edu? --      Excl. in GC? --     Constitutional: Alert and oriented. Does appear slightly intoxicated Eyes: Conjunctivae are normal.  Head: Atraumatic. Cardiovascular: Normal rate, regular rhythm. Good peripheral circulation. Grossly normal heart sounds. Respiratory: Normal respiratory effort.  No retractions. Lungs CTAB. Gastrointestinal: Soft and nontender. No distention.  Musculoskeletal: No lower extremity tenderness nor edema. No gross deformities of extremities. Neurologic:  Normal speech and language although he appears slightly intoxicated. No gross focal neurologic deficits are appreciated.  Skin:  Skin is warm, dry and intact. No rash noted. Psychiatric: Mood and affect are normal. Deflects questions to avoid answering directly.  Endorsed suicidality to law enforcement but denies it to me.  ____________________________________________   LABS (all labs ordered are listed, but only abnormal results are displayed)  Labs Reviewed  COMPREHENSIVE METABOLIC PANEL - Abnormal; Notable for the following:       Result Value   Glucose, Bld 149 (*)    Total Protein 8.8 (*)    AST 50 (*)    All other components within normal limits  ETHANOL - Abnormal; Notable for the following:    Alcohol, Ethyl (B) 191 (*)     All other components within normal limits  ACETAMINOPHEN LEVEL - Abnormal; Notable for the following:    Acetaminophen (Tylenol), Serum <10 (*)    All other components within normal limits  CBC - Abnormal; Notable for the following:    RDW 15.1 (*)    All other components within normal limits  SALICYLATE LEVEL  URINE DRUG SCREEN, QUALITATIVE ([facility] ONLY)   ____________________________________________  EKG  None - EKG not ordered by ED physician ____________________________________________  RADIOLOGY   No results found.  ____________________________________________   PROCEDURES  Critical Care performed: No   Procedure(s) performed:   Procedures   ____________________________________________   INITIAL IMPRESSION / ASSESSMENT AND PLAN / ED COURSE  As  part of my medical decision making, I reviewed the following data within the electronic MEDICAL RECORD NUMBER Labs reviewed [facility] Course for details)  and Notes from prior ED visits    the patient is in no acute distress.  Most likely he has limited judgment due to alcohol intoxication but I will maintain his involuntary commitment at this time until he can sober up and be evaluated by psychiatry.  I put him on CIWA protocol.  Clinical Course as of Jan 31 728  Caleen Essex Jan 30, 2017  4098 lab work is notable for an ethanol level of 191, otherwise his lab work is unremarkable.  We are still awaiting a urine specimen for urine drug screen.  [CF]    Clinical Course User Index [CF] Loleta Rose, MD    ____________________________________________  FINAL CLINICAL IMPRESSION(S) / ED DIAGNOSES  Final diagnoses:  Alcohol abuse  Suicidal ideation     MEDICATIONS GIVEN DURING THIS VISIT:  Medications  LORazepam (ATIVAN) injection 0-4 mg (not administered)    Or  LORazepam (ATIVAN) tablet 0-4 mg (not administered)  LORazepam (ATIVAN) injection 0-4 mg (not administered)    Or  LORazepam (ATIVAN) tablet 0-4 mg (not  administered)  thiamine (VITAMIN B-1) tablet 100 mg (not administered)    Or  thiamine (B-1) injection 100 mg (not administered)     NEW OUTPATIENT MEDICATIONS STARTED DURING THIS VISIT:  New Prescriptions   No medications on file    Modified Medications   No medications on file    Discontinued Medications   No medications on file     Note:  This document was prepared using Dragon voice recognition software and may include unintentional dictation errors.    Loleta Rose, MD 01/30/17 [phone]

## 2017-01-30 NOTE — ED Notes (Signed)
Pt was discharged to lobby where he was to await courtesy ride to bus. Pt signed for discharge and said he was eager to leave. He denied SI, HI, and AVH. He indicated he had received all belongings, but his memory of arrival was poor. Pt did not have a shirt or shoes. Scrubs and booties given. Pt was given a bus pass because he could not find someone by phone to pick him up. He was in no acute distress.

## 2017-01-30 NOTE — ED Notes (Signed)
ED Provider at bedside. 

## 2017-01-30 NOTE — ED Notes (Signed)
Pt asked if MD would wake him when he made rounds. Pt is eager to speak with MD, as he hopes to go home. Pt denies SI, HI, and AVH. Will continue to monitor for needs/safety.

## 2017-01-30 NOTE — ED Provider Notes (Signed)
Patient cleared for discharge by Dr. Luz Lex, MD 01/30/17 [phone]

## 2017-01-30 NOTE — Consult Note (Signed)
[facility] Consult   Reason for Consult:  Consult for 66 year old man with a history of alcohol abuse and mood symptoms who was brought in by police last night Referring Physician:  Corky Downs Patient Identification: David Blackburn MRN:  [id number] Principal Diagnosis: Substance induced mood disorder ([place]) Diagnosis:   Patient Active Problem List   Diagnosis Date Noted  . Substance induced mood disorder ([place]) [F19.94] 01/30/2017  . Alcohol abuse [F10.10] 01/30/2017  . Bipolar 2 disorder ([place]) [F31.81] 01/30/2017  . HTN (hypertension) [I10] 03/06/2015  . Dental abscess [K04.7] 08/29/2014  . Psoriasis [L40.9] 05/04/2014    Total Time spent with patient: 1 hour  Subjective:   David Blackburn is a 66 y.o. male patient admitted with "I had too much to drink".  HPI:  66 year old man brought in under IVC. Police reported that they had received a call stating that the patient was making harassing phone calls to one of his neighbors. When they came to check on him he made statements about how he was going to hurt himself. Patient was intoxicated when he was brought into the emergency room. He is now sober on interview. He admits to making statements about hurting himself. He says he knows that was in poor judgment and it was just because he was drunk. He says he had no actual thought or intention of hurting himself. He also admits that he had made phone calls to his neighbor. By his report this is a woman with whom he has had a long-standing relationship which is labile in nature. He says that he hadn't talked to her in a while and gave her a call when he was drunk. He denies having any thoughts at all about hurting her or doing anything violent. He says overall his mood recently has been a little bit more down. He admits that he's been drinking more frequently probably getting to the point of intoxication at least once a week. He is not getting any outpatient mental health or psychiatric  treatment. Sleep pattern is stable. Denies any hallucinations or psychotic symptoms.  Social history: Patient lives by himself. He has an adult son living in [place] with whom he has only occasional contact. No other family locally.  Medical history: High blood pressure psoriasis and history of some noncompliance with treatment.  Substance abuse history: Long-standing alcohol abuse. No history of seizures or delirium tremens. He says he has had extended periods of sobriety in the past and has been involved in outpatient substance abuse treatment but is not currently going. Denies any other drug use  Past Psychiatric History: Patient has been given a diagnosis of bipolar disorder and reports having been admitted to hospitals about 30 years ago. Had another hospitalization after that. Had been on lithium about 30 years ago but never liked taking it. Hasn't been on any psychiatric medicine in years. He denies any history of suicide attempts or violence.  Risk to Self: Suicidal Ideation: Yes-Currently Present Suicidal Intent: No Is patient at risk for suicide?: No Suicidal Plan?: No Access to Means: No What has been your use of drugs/alcohol within the last 12 months?: 750  ml rum Other Self Harm Risks: none identified Triggers for Past Attempts: None known Intentional Self Injurious Behavior: None Risk to Others: Homicidal Ideation: No Thoughts of Harm to Others: No Current Homicidal Intent: No Current Homicidal Plan: No Access to Homicidal Means: No Identified Victim: none identified History of harm to others?: No Assessment of Violence: None Noted Violent Behavior Description:  none identified Does patient have access to weapons?: No Criminal Charges Pending?: No Does patient have a court date: No Prior Inpatient Therapy: Prior Inpatient Therapy: Yes Prior Therapy Dates: unknown Prior Therapy Facilty/Provider(s): [facility] Reason for Treatment: depression Prior Outpatient Therapy:  Prior Outpatient Therapy: No Prior Therapy Dates: n/a Prior Therapy Facilty/Provider(s): n/a Reason for Treatment: n/a Does patient have an ACCT team?: Unknown Does patient have Intensive In-House Services?  : No Does patient have [facility] services? : Unknown Does patient have P4CC services?: Unknown  Past Medical History:  Past Medical History:  Diagnosis Date  . Diabetes mellitus without complication ([place])   . Hypertension   . Psoriasis   . Sciatica     Past Surgical History:  Procedure Laterality Date  . TONSILLECTOMY  1961  . VASECTOMY  1980   Family History:  Family History  Problem Relation Age of Onset  . Lung cancer Mother   . Hypertension Mother    Family Psychiatric  History: Not aware of any family history Social History:  History  Alcohol Use  . Yes    Comment: "sometimes a bottle of wine in a few hours"      History  Drug use: Unknown    Social History   Social History  . Marital status: Single    Spouse name: N/A  . Number of children: N/A  . Years of education: N/A   Social History Main Topics  . Smoking status: Former Smoker    Packs/day: 0.01    Types: Cigars    Quit date: 06/08/2007  . Smokeless tobacco: Former Systems developer     Comment: occassionally a cigar  . Alcohol use Yes     Comment: "sometimes a bottle of wine in a few hours"   . Drug use: Unknown  . Sexual activity: Not Asked   Other Topics Concern  . None   Social History Narrative  . None   Additional Social History:    Allergies:  No Known Allergies  Labs:  Results for orders placed or performed during the hospital encounter of 01/30/17 (from the past 48 hour(s))  Comprehensive metabolic panel     Status: Abnormal   Collection Time: 01/30/17  4:26 AM  Result Value Ref Range   Sodium 141 135 - 145 mmol/L   Potassium 4.1 3.5 - 5.1 mmol/L   Chloride 103 101 - 111 mmol/L   CO2 23 22 - 32 mmol/L   Glucose, Bld 149 (H) 65 - 99 mg/dL   BUN 17 6 - 20 mg/dL   Creatinine, Ser  1.06 0.61 - 1.24 mg/dL   Calcium 10.1 8.9 - 10.3 mg/dL   Total Protein 8.8 (H) 6.5 - 8.1 g/dL   Albumin 4.9 3.5 - 5.0 g/dL   AST 50 (H) 15 - 41 U/L   ALT 54 17 - 63 U/L   Alkaline Phosphatase 100 38 - 126 U/L   Total Bilirubin 1.2 0.3 - 1.2 mg/dL   GFR calc non Af Amer >60 >60 mL/min   GFR calc Af Amer >60 >60 mL/min    Comment: (NOTE) The eGFR has been calculated using the CKD EPI equation. This calculation has not been validated in all clinical situations. eGFR's persistently <60 mL/min signify possible Chronic Kidney Disease.    Anion gap 15 5 - 15  Ethanol     Status: Abnormal   Collection Time: 01/30/17  4:26 AM  Result Value Ref Range   Alcohol, Ethyl (B) 191 (H) <10 mg/dL  Comment:        LOWEST DETECTABLE LIMIT FOR SERUM ALCOHOL IS 10 mg/dL FOR MEDICAL PURPOSES ONLY Please note change in reference range.   Salicylate level     Status: None   Collection Time: 01/30/17  4:26 AM  Result Value Ref Range   Salicylate Lvl <1.6 2.8 - 30.0 mg/dL  Acetaminophen level     Status: Abnormal   Collection Time: 01/30/17  4:26 AM  Result Value Ref Range   Acetaminophen (Tylenol), Serum <10 (L) 10 - 30 ug/mL    Comment:        THERAPEUTIC CONCENTRATIONS VARY SIGNIFICANTLY. A RANGE OF 10-30 ug/mL MAY BE AN EFFECTIVE CONCENTRATION FOR MANY PATIENTS. HOWEVER, SOME ARE BEST TREATED AT CONCENTRATIONS OUTSIDE THIS RANGE. ACETAMINOPHEN CONCENTRATIONS >150 ug/mL AT 4 HOURS AFTER INGESTION AND >50 ug/mL AT 12 HOURS AFTER INGESTION ARE OFTEN ASSOCIATED WITH TOXIC REACTIONS.   cbc     Status: Abnormal   Collection Time: 01/30/17  4:26 AM  Result Value Ref Range   WBC 10.4 3.8 - 10.6 K/uL   RBC 5.57 4.40 - 5.90 MIL/uL   Hemoglobin 16.6 13.0 - 18.0 g/dL   HCT 48.1 40.0 - 52.0 %   MCV 86.3 80.0 - 100.0 fL   MCH 29.7 26.0 - 34.0 pg   MCHC 34.4 32.0 - 36.0 g/dL   RDW 15.1 (H) 11.5 - 14.5 %   Platelets 267 150 - 440 K/uL  Urine Drug Screen, Qualitative     Status: None    Collection Time: 01/30/17 11:39 AM  Result Value Ref Range   Tricyclic, Ur Screen NONE DETECTED NONE DETECTED   Amphetamines, Ur Screen NONE DETECTED NONE DETECTED   MDMA (Ecstasy)Ur Screen NONE DETECTED NONE DETECTED   Cocaine Metabolite,Ur [place] NONE DETECTED NONE DETECTED   Opiate, Ur Screen NONE DETECTED NONE DETECTED   Phencyclidine (PCP) Ur S NONE DETECTED NONE DETECTED   Cannabinoid 50 Ng, Ur [place] NONE DETECTED NONE DETECTED   Barbiturates, Ur Screen NONE DETECTED NONE DETECTED   Benzodiazepine, Ur Scrn NONE DETECTED NONE DETECTED   Methadone Scn, Ur NONE DETECTED NONE DETECTED    Comment: (NOTE) 109  Tricyclics, urine               Cutoff 1000 ng/mL 200  Amphetamines, urine             Cutoff 1000 ng/mL 300  MDMA (Ecstasy), urine           Cutoff 500 ng/mL 400  Cocaine Metabolite, urine       Cutoff 300 ng/mL 500  Opiate, urine                   Cutoff 300 ng/mL 600  Phencyclidine (PCP), urine      Cutoff 25 ng/mL 700  Cannabinoid, urine              Cutoff 50 ng/mL 800  Barbiturates, urine             Cutoff 200 ng/mL 900  Benzodiazepine, urine           Cutoff 200 ng/mL 1000 Methadone, urine                Cutoff 300 ng/mL 1100 1200 The urine drug screen provides only a preliminary, unconfirmed 1300 analytical test result and should not be used for non-medical 1400 purposes. Clinical consideration and professional judgment should 1500 be applied to any positive drug screen result due to possible 1600  interfering substances. A more specific alternate chemical method 1700 must be used in order to obtain a confirmed analytical result.  1800 Gas chromato graphy / mass spectrometry (GC/MS) is the preferred 1900 confirmatory method.     Current Facility-Administered Medications  Medication Dose Route Frequency Provider Last Rate Last Dose  . LORazepam (ATIVAN) injection 0-4 mg  0-4 mg Intravenous Q6H Hinda Kehr, MD       Or  . LORazepam (ATIVAN) tablet 0-4 mg  0-4 mg Oral  Q6H Hinda Kehr, MD      . Derrill Memo ON 02/01/2017] LORazepam (ATIVAN) injection 0-4 mg  0-4 mg Intravenous Q12H Hinda Kehr, MD       Or  . Derrill Memo ON 02/01/2017] LORazepam (ATIVAN) tablet 0-4 mg  0-4 mg Oral Q12H Hinda Kehr, MD      . thiamine (VITAMIN B-1) tablet 100 mg  100 mg Oral Daily Hinda Kehr, MD   100 mg at 01/30/17 1610   Or  . thiamine (B-1) injection 100 mg  100 mg Intravenous Daily Hinda Kehr, MD       Current Outpatient Prescriptions  Medication Sig Dispense Refill  . aspirin 81 MG tablet Take 81 mg by mouth as needed for pain.    . Cinnamon 500 MG capsule Take 500 mg by mouth daily.    . cyclobenzaprine (FLEXERIL) 10 MG tablet Take 1 tablet (10 mg total) by mouth 3 (three) times daily as needed for muscle spasms. (Patient not taking: Reported on 05/07/2016) 30 tablet 0  . Flaxseed, Linseed, (FLAX SEEDS PO) Take by mouth.    Marland Kitchen FLUVIRIN SUSP ADM 0.5ML IM UTD  0  . FOLIC ACID PO Take by mouth.    . gabapentin (NEURONTIN) 300 MG capsule Take 1 capsule (300 mg total) by mouth 3 (three) times daily. 90 capsule 11  . geriatric multivitamins-minerals (ELDERTONIC/GEVRABON) ELIX Take 15 mLs by mouth daily.    . Insulin Detemir (LEVEMIR FLEXPEN) 100 UNIT/ML Pen Inject 8 Units into the skin daily at 10 pm. (Patient not taking: Reported on 05/07/2016) 2.4 mL 2  . losartan (COZAAR) 50 MG tablet Take 1 tablet (50 mg total) by mouth daily. 30 tablet 11  . Multiple Vitamins-Minerals (MULTIVITAMIN ADULT PO) Take by mouth.    Marland Kitchen NIACIN, ANTIHYPERLIPIDEMIC, PO Take by mouth as needed.     Marland Kitchen VITAMIN D, CHOLECALCIFEROL, PO Take by mouth.      Musculoskeletal: Strength & Muscle Tone: within normal limits Gait & Station: normal Patient leans: N/A  Psychiatric Specialty Exam: Physical Exam  Nursing note and vitals reviewed. Constitutional: He appears well-developed and well-nourished.  HENT:  Head: Normocephalic and atraumatic.  Eyes: Pupils are equal, round, and reactive to light.  Conjunctivae are normal.  Neck: Normal range of motion.  Cardiovascular: Regular rhythm and normal heart sounds.   Respiratory: Effort normal. No respiratory distress.  GI: Soft.  Musculoskeletal: Normal range of motion.  Neurological: He is alert.  Skin: Skin is warm and dry.  Psychiatric: He has a normal mood and affect. His speech is normal and behavior is normal. Judgment and thought content normal. Cognition and memory are normal.    Review of Systems  Constitutional: Negative.   HENT: Negative.   Eyes: Negative.   Respiratory: Negative.   Cardiovascular: Negative.   Gastrointestinal: Negative.   Musculoskeletal: Negative.   Skin: Negative.   Neurological: Negative.   Psychiatric/Behavioral: Positive for substance abuse. Negative for depression, hallucinations, memory loss and suicidal ideas. The patient is not nervous/anxious  and does not have insomnia.     Blood pressure 139/84, pulse 70, temperature 97.9 F (36.6 C), temperature source Oral, resp. rate 17, weight 94.8 kg (209 lb), SpO2 96 %.Body mass index is 30.86 kg/m.  General Appearance: Casual  Eye Contact:  Good  Speech:  Clear and Coherent  Volume:  Increased  Mood:  Euthymic  Affect:  Appropriate  Thought Process:  Disorganized  Orientation:  Full (Time, Place, and Person)  Thought Content:  Logical, Rumination and Tangential  Suicidal Thoughts:  No  Homicidal Thoughts:  No  Memory:  Immediate;   Good Recent;   Fair Remote;   Fair  Judgement:  Fair  Insight:  Fair  Psychomotor Activity:  Normal  Concentration:  Concentration: Fair  Recall:  [place] of Knowledge:  Poor  Language:  Fair  Akathisia:  No  Handed:  Right  AIMS (if indicated):     Assets:  Desire for Improvement Housing Resilience  ADL's:  Intact  Cognition:  WNL  Sleep:        Treatment Plan Summary: Plan 66 year old man with alcohol abuse and a past history of bipolar disorder. Currently he is sober and presents as pleasant  and charming in a way that may indicate some degree of hypomania. Probably has bipolar too. Patient doesn't show any sign of dangerousness and does not meet commitment criteria. Discontinue IVC. Strongly encourage patient to discontinue alcohol use and consider follow-up with local mental health. He will be given information about [facility] at discharge. Case reviewed with the ER physician and TTS.  Disposition: Patient does not meet criteria for psychiatric inpatient admission. Supportive therapy provided about ongoing stressors.  Alethia Berthold, MD 01/30/2017 3:19 PM

## 2017-01-30 NOTE — BH Assessment (Signed)
Assessment Note  Shivansh Kutch is an 66 y.o. male presenting to the ED, via [place] [employer], under IVC due to threatening to kill his neighbor and himself.  Patient also presents intoxicated.  BAC is33 129Western SaWebster County Memorial [id number](2Scientist, phyHaxtun HoDarin EngeAdventist Health Sonora RegionUpper Connecticut Valley Hospitala130Va [place] 77H19eaWestern SaWhittier Hospital Medical [id number] Vall(2Scientist, phyRocky Mountain Laser AndDarin EngeNort13086KelSpecialty Surgical Center Of Thousand Oaks LPli7e684CWestern SaNapa State Hospit130403-7313130869629Cornerstone Hosp(2Scientist, phyThe Maryland Filutowski Eye Institute Pa Dba Lake Mary Surgical CenteMontgome35r63y Western SaRiddle Surgical Center L13081781313086For9629Memorial Hermann Surgery Cente3S1East Morgan County [facility] District3086Corners54t20oWWestern SaTruman Medical Center - Lakewo130636-331313086K9629Moye Medical Endoscopy Center LLC Dba East [place] Endos6Scientist, phyTomah VaDCharleston Ent Associates LLC Dba Surgery Center Of CharlestonarinAdvancedSurgc84e2ntWestern SaOur Lady Of The Angels Hospit130803 [phone](3Scientist, Holy Name HospitalphyMKentuck107W22inWestern SaGibson Community [phone] Hospital7Scientist, phyUniversAmerican Fork Hospitality Post Acute SpecialtHemet15 53HeWestern SaPhiladeLPhia Va Medical Cent130431 31313086W9629St Vincent [facility] Hospit(62Scientist, phyTexas Health Presbyt13Ridgeview Sibley Medical Center086KMercy HospitaSp80a30ldWestern SaWenatchee Valley [phone] (5Scientist, phyPcs Darin ERegional General Hospital Willistonnge1BaptOhio Valley Medical CentersDrue SecoShanda Kenney Housem56458yo343Sepulv10e79daWestern SaUc Regents Ucla Dept Of Medicine Professional Gro130507 [id number] Spohn Hos(60Scientist, phyTerre1Greenville Community Hospital West3086PheLPs County 3C52enWestern SaCarris Health Redwood Area [id number]'S Hospital & Med(25Scientist, phyMerced AmbulLake Huron Medical70 80CeWestern SaApogee Outpatient Surgery Cent130([phone]) [phone] Sulphu9629Odessa Regional Med(2Scientist, pSoutheast Louisiana Veterans Health Care Syste60A74urWestern SaLake Endoscopy Cent130724-751313086East Va9629Hoag Orthopedi(4Scientist, phyEly BloomensGundersen St Josephs Hlth SvcsoD13Premier Speci65a71CiWestern SaOur Lady Of Lourdes Regional Medical Cent130713-221319629[place] Psychia4Scientist, phyBaylor ScoManning Regional Healthcaret130Vision Surg54e41PrWestern SaSummerville Endoscopy [id number] Center2Scientist, phyWest Bend Su13086KeGrand Rapids Surgical Suites PLLCAdvanced Pain Ins73t81itWestern SaGastrointestinal Specialists Of Cl[place]ville 130321-613130869629Hshs Good Shepard H8Scientist, phyArkansas Vall1308Community Hospitals And Wellness Centers Mont32p50elWestern SaPremier Health Associates [phone] Sur5Scientist, phyMadison ComDar1308Akron Surgical Associates LLC6KelWise HTlc Asc LLC Dba Tlc Outpatie95n38t Western SaChi Lisbon Heal130603 [phone] Regional Med(3Scientist, pMarshall County Healthcare CenterhyS1Healthsouth Rehabiliation HosWyc15k65ofWestern SaDesert Sun Surgery Center L130587-181313086Moun9629[place] Memori3Scientist, phyUt Health East DSouthwest Hospital And Medical Centerari1BlacCapital Healt28h73 MWestern SaHosp Del Maest130517-411313086Mid9629Center For En(2Scientist, phyBaton Rouge General 13086St Joseph Mercy HospitalEncompass Rehab69i58liWestern SaBryan Medical Cent13072431319629[place] EndoscopScientist, phyGastroenterology DiMayo Clinic Health System S FagnoBaptist EmergeNorthern65 44ViWestern SaBigfork Valley Hospit130661 [phone]. V. (Sonny) Montgomery Va Medical Cente4ScJohns Hopkins Bayview Medical CenterientTK91i36ndWestern SaUpmc Mer13032441313089629Transsouth Health Care Pc Dba Ddc Sur(8Scientist, phyProSt Davids Surgical Hospital A Campus Of North Austin Medical 51S100smWestern SaCimarron Memorial [phone] Sur9Scientist, phyRiv130Aiken Regional Medical CenteMou31n60taWestern SaTouro Infirma130([phone] Behavioral2Scientist, phyRidD13086KelMainegeneral Medical Center-Setonlie8Logan Sonora Behavioral61 27HeWestern SaTwin Cities Ambulatory Surgery Center [phone] Hi4Scientist, phyOak13086Kellie7213Trevose Specialty Care Surgical Center LLCC BuQuad City AmbulaN51i15x Western SaCrossridge Community Hospit13074211313086Lay9629Valley Digestive He6Scientist, phyCtgi EndosDarin EngeLouHarlingen Medical Centerisv1K67S36t Western SaLane Frost Health And Rehabilitation Cent130([phone] Health Hugul(90Scientist, phyUniversity Of Colorado Hospital Anschutz In1Palms West Hospital3086Alvarado B50r30onWestern SaVariety Childrens Hospit130828-261313086B9629Millard Family Hospital, LLC Dba Millard Fami2Scientist, phyColorado RiverDarBarbourville Arh Hospitalin EMissoula Bone AThe Surgery Center A59t64 SWestern SaHarlingen Surgical Center [phone] Of Hope Helford Clinical Resear(5Scientist, phyChilton MeDarin EngeFWest Hills Surgical Center LtdloriNSurgery Center Of Cherry Hill D B A Wills Su76r24geWestern SaBournewood Hospit130([phone] Airy Ambulatory Endoscopy SuScientist, phyAllegiance SpecBakersfield Behavorial Healthcare Hospital, LLCi130North Garland Surgery Center LLP Dba Baylor Sc42o39ttWestern SaHamilton General [phone] 9629South [place] Rehabilitati(Orthopaedic Surgery Center Of Ra60leWestern SaAnderson Endoscopy Cente551308Denver Mid Town Surge13086SouthSan27f47orWestern SaThe Bariatric Center Of [place], L130630-71313089629Lehigh Valley Hospital Transp7Scientist, phySuncoast Specialty SurOutpatient Womens And Childrens Surgery Center LtdgDa1C64o68liWestern SaMidwest Surgery Cent130959-18131309629Endoscopy Center Of Gunbarrel Digestive Heal4Scientist, phIntracoastal Surgery 46CeWestern SaCarlinville Area Hospita581308MThe Surgery Center Of Alta Bates Summit Medical3SCameron Surge87r64y Western SaCape Fear Valley - Bladen County Hospit130336 [phone] Endoscop3Scientist, phyReid Hospital & HealSwedish Medical Center - Ballard Campust130Hopi Health Care 70C55enWestern SaPark Ridge Surgery Center L130938 [phone] 9629Whitman Hospital And Med9Scientist, phyRoHosp Pediatrico Universitario Dr Antonio OrtizosevAltrSurgical Specialist73s55d Western SaJewish Hospital & St. Mary'S Healthca13097761319629Great Plains Regional Med4Scientist, phy13086KelEdward Plainfieldlie7Southwestern [place] Rush Surgicenter At The Professional Building Ltd Partnership Dba Rush38 37SuWestern SaValley Hospit130847-7613139629Eye And Laser Surgery Centers Of New4Scientist, phyValley ChilD1Orthocolorado Hospital At St Anthony Med Campus3086KaiseB1l69ooWestern SaRockledge Regional Medical Cent130705 [phone](4Scientist, phyProgressive LaseChi St Alexius Health Turtle Laker S1VirNorther71n23 PWestern SaUpmc Northwest - Sene130785 [phone] Medical Center5Scientist, phySyracuse VChadron Community Hospital And Health Ser55v34icWestern SaLake Charles Memorial Hospit130351-33131309629Nea Baptist Memo(2Scientist, phyTennova HeaRockledge Regional Medical CenterlthcNovamed Surgery CenCjw Medi38c6alWestern SaQuincy Valley Medical Cent1302941313086B9629West Valley Med4Scientist, phyKu Medwest AmbulatoryHighline South Ambulatory Surgery CeW55e106stWestern SaCamden Clark Medical Cent130623 [phone] Healthcare 2Scientist, phyChi St Lukes Health MemoriaDaAscension Ne Wisconsin Mercy Campusri13Encompass Health Rehabil43i66taWestern SaArundel Ambulatory Surgery [phone] Med(6Scientist, phyCoral GHillside Diagnostic And Treatment Center LLCa130Kindred HoNorthern52 68LiWestern SaCohen Children’S Medical Cent130(717) [phone] 9Scientist, phyWhite River Jct VaDarGeneva Woods Surgical Center Incin ESurgiVa 40M56edWestern SaSouth Mississippi County Regional Medical Cent130(518)[phone] ParisScientist, phySt. Surgical Center Of Dupage Medical GroupEliDAsce59n51BaWestern SaHorton Community [phone] Ambulatory Surgery Center Of N2Scientist, phySaint Andrews Hospital And HeDar13086Healthpark Medical66 91CeWestern SaEast Columbus Surgery Center L130725-74139629Indiana University Health ArnScientist, phyHeart Of Texas MeDarinPrisma Health Laurens County Hospital1308Cache VaSaint Mar74y25'SWestern SaLone Peak [id number] N Jones Regional MeScientist, phyNorthshore Ambulatory SurDarin EngeEye Premium Surgery Center2 14LLWestern SaHshs Good Shepard Hospital [id number] Valley Med(4Scientist, phyWhitesbuDarin EngeSpalding Endoscopy Center LLCIKearne63y9 PWestern SaGrande Ronde Hospit130([phone]'S Hospital Mc - C(4Scientist, phySaint Joseph HospitalDarin EngeThe1Vail Valley Surgery Center LLC Dba Vail Valley Surgery Center Vail3086Mercy Walworth HoPrince1t5onWestern SaCherokee Mental Health Institu13094411313089629Beaufort Memori(6Scientist, phyCedaAlliancehealth Ponca Cityr SpEyecare Consul39t34HiWestern SaLafayette General Surgical Hospit130([phone] Regional Hospital And Med(7Scientist, phyPam Speciality Hospital ODarin EngeVa HudPenn Highlands Clearfieldso13Astra SunnyThe En19d10osWestern SaMedical Center At Elizabeth Pla130(248) [phone] James Cancer Hospital & Solove Researc5Scientist, phyChenango MeDarin E13Vanderbilt Wilson [facility]086KSt Louis SpTexas Health Sp5e36ciWestern SaSurgcenter Northeast L130(209) [phone] County Gener7Scientist, phySpokMarion General Hospitalane S50u70rgWestern SaCharleston Surgical Hospit130([phone] Calcasieu Surg(65Scientist, phyMercy CathSpringhill Surgery Center LLColicShannon MedicalLit83t67leWestern SaPremier Ambulatory Surgery Cent130([phone] Surgery6Scientist, phySuAurora Chicago Lakeshore Hospital, LLC - Dba Aurora Chicago Lakeshore43 59HPWestern SaMercy Rehabilitation Hospital St. Lou130617-37131309629Mayo Clinic HeScientist, phyThomas Cheyenne County HospitalJohnWellspan Good 30N42ewWestern SaHarbin Clinic L130([phone] Surgery And L2Scientist, phyKirby Noland Hospital Montgomery, LLCForeCarilion 33R72oaWestern SaMetropolitan New Jersey LLC Dba Metropolitan Surgery Cent130([phone] Muir Medical Center-Walnut C6Scientist, phyIndian Creek Ambul13086KMarshfield Medical Ctr NeillsvilleelliCitrus Valley MedThe 55E42ndWestern SaPsa Ambulatory Surgical Center Of Aust130541-7813139629Va Hudson Valley Healthcare System - C8Scientist, phyBlSo96 S. K1308EspMappsv19iSaxon Surgical CenterlVa Medic59a61l Western SaSurgcenter Northeast L130504 [id number]-Sierra 8Scientist, phyNoland Hospital D130Hardin Memorial Hospital86KeSharp Mcdonald Cent<MEASUREMENA66r130229130962Surgery Center Of30 130Du [place]13620[place] 22M56edWestern SaPediatric Surgery Centers L130743-31319629Surgecenter O(6Scientist, phySurgical Center At CDarin 13Umass Memorial Medical Center - University Campus086KPrCherokee Nat1i64onWestern SaWellstar Sylvan Grove Hospit130630 [id number] Permanente Downey Med3Scientist, phyVa Caribbean HeDarTwin Valley Behavioral HealthcareinEncompass Health 40E67asWestern SaSt Rita'S Medical Cent130(212) [phone] MeScientist, phyCar13086KePenn Highlands ElkllieDavis AmbRoane Medical Centerlatory Surgical Center295ChiltonH83artLuciann13042EsperPrairie Gilmore L1arocVictorin578 self by either cutting himself with a razor blade or hanging himself with a cord.  Pt denies SI while in the ED and states that "it was all a joke. I don't want to hurt myself".    Pt denies HI and auditory/visual hallucinations.  He reports being hospitalized at [facility] years ago but could not remember the exact dates.  Pt denies receiving any current mental health treatment.  Diagnosis: Alcohol Use disorder  Past Medical History:  Past Medical History:  Diagnosis Date  . Diabetes mellitus without complication (HCC)   . Hypertension   . Psoriasis   . Sciatica     Past Surgical History:  Procedure Laterality Date  . TONSILLECTOMY  1961  . VASECTOMY  1980    Family History:  Family History  Problem Relation Age of Onset  . Lung cancer Mother   . Hypertension Mother     Social History:  reports that he quit smoking about 9 years ago. His smoking use included Cigars. He smoked 0.01 packs per day. He has quit using smokeless tobacco. He reports that he drinks alcohol. His drug history is not on file.  Additional Social History:  Alcohol / Drug Use Pain Medications: See PTA Prescriptions: See PTA Over the Counter: See PTA History of alcohol / drug use?: Yes Longest period of sobriety (when/how long): ETOH Negative Consequences of Use: Personal relationships  CIWA: CIWA-Ar BP: (!) 180/83 Pulse Rate: 77 COWS:    Allergies: No Known Allergies  Home Medications:  (Not in a hospital admission)  OB/GYN Status:  No LMP for male patient.  General Assessment Data Location of Assessment: [facility] ED TTS Assessment: In system Is this a Tele or  Face-to-Face Assessment?: Face-to-Face Is this an Initial Assessment or a Re-assessment for this encounter?: Initial Assessment Marital status: Single Maiden name: n/a Is patient pregnant?: Other (Comment) Pregnancy Status: Other (Comment) Living Arrangements: Alone Can pt return to current living arrangement?: Yes Admission Status: Involuntary Is patient capable of signing voluntary admission?: No Referral Source: Self/Family/Friend Insurance type: Med PAY     Crisis Care Plan Living Arrangements: Alone Legal Guardian: Other: (self) Name of Psychiatrist: none reported Name of Therapist: none reported  Education Status Is patient currently in school?: No Current Grade: n/a Highest grade of school patient has completed: unknown Name of school: unknown Contact person: n/a  Risk to self with the past 6 months Suicidal Ideation: Yes-Currently Present Has patient been a risk to self within the past 6 months prior to admission? : No Suicidal Intent: No Has patient had any suicidal intent within the past 6 months prior to admission? : No Is patient at risk for suicide?: No Suicidal Plan?: No Has patient had any suicidal plan within the past 6 months prior to admission? : No Access to Means: No What has been your use of drugs/alcohol within the last 12 months?: 750  ml rum Previous Attempts/Gestures: No Other Self Harm Risks: none identified Triggers for Past Attempts: None known Intentional Self Injurious Behavior: None Family Suicide History: Unknown Recent stressful life event(s): Conflict (Comment) (conflict with neighbor) Persecutory voices/beliefs?: No Depression: No Substance abuse  history and/or treatment for substance abuse?: Yes Suicide prevention information given to non-admitted patients: Not applicable  Risk to Others within the past 6 months Homicidal Ideation: No Does patient have any lifetime risk of violence toward others beyond the six months prior to  admission? : No Thoughts of Harm to Others: No Current Homicidal Intent: No Current Homicidal Plan: No Access to Homicidal Means: No Identified Victim: none identified History of harm to others?: No Assessment of Violence: None Noted Violent Behavior Description: none identified Does patient have access to weapons?: No Criminal Charges Pending?: No Does patient have a court date: No Is patient on probation?: Unknown  Psychosis Hallucinations: None noted Delusions: None noted  Mental Status Report Appearance/Hygiene: In scrubs, Bizarre Eye Contact: Good Motor Activity: Gestures Speech: Logical/coherent Level of Consciousness: Alert Mood: Other (Comment) (intoxicated) Affect: Appropriate to circumstance Anxiety Level: None Thought Processes: Thought Blocking Judgement: Partial Orientation: Person, Place, Time, Situation Obsessive Compulsive Thoughts/Behaviors: None  Cognitive Functioning Concentration: Good Memory: Recent Intact, Remote Intact IQ: Average Insight: Poor Impulse Control: Poor Appetite: Good Sleep: No Change Vegetative Symptoms: Unable to Assess  ADLScreening [facility] Assessment Services) Patient's cognitive ability adequate to safely complete daily activities?: Yes Patient able to express need for assistance with ADLs?: Yes Independently performs ADLs?: Yes (appropriate for developmental age)  Prior Inpatient Therapy Prior Inpatient Therapy: Yes Prior Therapy Dates: unknown Prior Therapy Facilty/Provider(s): [facility] Reason for Treatment: depression  Prior Outpatient Therapy Prior Outpatient Therapy: No Prior Therapy Dates: n/a Prior Therapy Facilty/Provider(s): n/a Reason for Treatment: n/a Does patient have an ACCT team?: Unknown Does patient have Intensive In-House Services?  : No Does patient have [facility] services? : Unknown Does patient have P4CC services?: Unknown  ADL Screening (condition at time of admission) Patient's cognitive ability  adequate to safely complete daily activities?: Yes Patient able to express need for assistance with ADLs?: Yes Independently performs ADLs?: Yes (appropriate for developmental age)       Abuse/Neglect Assessment (Assessment to be complete while patient is alone) Physical Abuse: Denies Verbal Abuse: Denies Sexual Abuse: Denies Exploitation of patient/patient's resources: Denies Self-Neglect: Denies Values / Beliefs Cultural Requests During Hospitalization: None Spiritual Requests During Hospitalization: None Consults Spiritual Care Consult Needed: No Social Work Consult Needed: No Merchant navy officer (For Healthcare) Does Patient Have a Medical Advance Directive?: No Would patient like information on creating a medical advance directive?: No - Patient declined    Additional Information 1:1 In Past 12 Months?: No CIRT Risk: No Elopement Risk: No Does patient have medical clearance?: Yes     Disposition:  Disposition Initial Assessment Completed for this Encounter: Yes Disposition of Patient: Other dispositions Other disposition(s): Other (Comment) (Pending Psych MD consult)  On Site Evaluation by:   Reviewed with Physician:    Manus Rudd Kathern Lobosco 01/30/2017 5:51 AM

## 2017-06-24 ENCOUNTER — Other Ambulatory Visit: Payer: Self-pay

## 2017-06-24 DIAGNOSIS — E119 Type 2 diabetes mellitus without complications: Secondary | ICD-10-CM

## 2017-06-25 LAB — COMPREHENSIVE METABOLIC PANEL
ALBUMIN: 4.8 g/dL (ref 3.6–4.8)
ALK PHOS: 96 IU/L (ref 39–117)
ALT: 53 IU/L — ABNORMAL HIGH (ref 0–44)
AST: 41 IU/L — ABNORMAL HIGH (ref 0–40)
Albumin/Globulin Ratio: 1.7 (ref 1.2–2.2)
BILIRUBIN TOTAL: 0.8 mg/dL (ref 0.0–1.2)
BUN / CREAT RATIO: 11 (ref 10–24)
BUN: 13 mg/dL (ref 8–27)
CO2: 21 mmol/L (ref 20–29)
CREATININE: 1.15 mg/dL (ref 0.76–1.27)
Calcium: 10 mg/dL (ref 8.6–10.2)
Chloride: 100 mmol/L (ref 96–106)
GFR calc Af Amer: 77 mL/min/{1.73_m2} (ref >59)
GFR calc non Af Amer: 67 mL/min/{1.73_m2} (ref >59)
Globulin, Total: 2.8 g/dL (ref 1.5–4.5)
Glucose: 161 mg/dL — ABNORMAL HIGH (ref 65–99)
Potassium: 4.5 mmol/L (ref 3.5–5.2)
SODIUM: 139 mmol/L (ref 134–144)
Total Protein: 7.6 g/dL (ref 6.0–8.5)

## 2017-06-25 LAB — URINALYSIS
Bilirubin, UA: NEGATIVE
GLUCOSE, UA: NEGATIVE
Ketones, UA: NEGATIVE
LEUKOCYTES UA: NEGATIVE
Nitrite, UA: NEGATIVE
RBC, UA: NEGATIVE
Specific Gravity, UA: 1.017 (ref 1.005–1.030)
Urobilinogen, Ur: 0.2 mg/dL (ref 0.2–1.0)
pH, UA: 5 (ref 5.0–7.5)

## 2017-06-25 LAB — LIPID PANEL
CHOL/HDL RATIO: 6 ratio — AB (ref 0.0–5.0)
CHOLESTEROL TOTAL: 223 mg/dL — AB (ref 100–199)
HDL: 37 mg/dL — ABNORMAL LOW (ref >39)
LDL CALC: 135 mg/dL — AB (ref 0–99)
Triglycerides: 256 mg/dL — ABNORMAL HIGH (ref 0–149)
VLDL Cholesterol Cal: 51 mg/dL — ABNORMAL HIGH (ref 5–40)

## 2017-06-25 LAB — CBC
Hematocrit: 46.7 % (ref 37.5–51.0)
Hemoglobin: 16.2 g/dL (ref 13.0–17.7)
MCH: 30.7 pg (ref 26.6–33.0)
MCHC: 34.7 g/dL (ref 31.5–35.7)
MCV: 89 fL (ref 79–97)
PLATELETS: 273 10*3/uL (ref 150–379)
RBC: 5.27 x10E6/uL (ref 4.14–5.80)
RDW: 13.5 % (ref 12.3–15.4)
WBC: 8.5 10*3/uL (ref 3.4–10.8)

## 2017-06-25 LAB — TSH: TSH: 2.24 u[IU]/mL (ref 0.450–4.500)

## 2017-06-25 LAB — MICROALBUMIN, URINE: Microalbumin, Urine: 544 ug/mL

## 2017-06-25 LAB — PSA: PROSTATE SPECIFIC AG, SERUM: 2.1 ng/mL (ref 0.0–4.0)

## 2017-07-01 ENCOUNTER — Encounter: Payer: Self-pay | Admitting: Internal Medicine

## 2017-07-01 ENCOUNTER — Ambulatory Visit: Payer: Self-pay | Admitting: Internal Medicine

## 2017-07-01 VITALS — BP 132/88 | HR 76 | Temp 97.8°F | Wt 216.9 lb

## 2017-07-01 DIAGNOSIS — E119 Type 2 diabetes mellitus without complications: Secondary | ICD-10-CM

## 2017-07-01 NOTE — Progress Notes (Signed)
Soreness in R back tongue, affecting speech.

## 2017-07-01 NOTE — Addendum Note (Signed)
Addended by: Senaida OresKYLE, Leanny Moeckel S on: 07/01/2017 10:56 AM   Modules accepted: Orders

## 2017-07-01 NOTE — Progress Notes (Signed)
   Subjective:    Patient ID: David Blackburn, male    DOB: 11/12/1950, 67 y.o.   MRN: [id number]  HPI   Pt is here for a f/u on lab results.    Allergies as of 07/01/2017   No Known Allergies     Medication List        Accurate as of 07/01/17 10:19 AM. Always use your most recent med list.          aspirin 81 MG tablet Take 81 mg by mouth as needed for pain.   Cinnamon 500 MG capsule Take 500 mg by mouth daily.   cyclobenzaprine 10 MG tablet Commonly known as:  FLEXERIL Take 1 tablet (10 mg total) by mouth 3 (three) times daily as needed for muscle spasms.   FLAX SEEDS PO Take by mouth.   FLUVIRIN Susp Generic drug:  Influenza Vac Typ A&B Surf Ant ADM 0.5ML IM UTD   FOLIC ACID PO Take by mouth.   gabapentin 300 MG capsule Commonly known as:  NEURONTIN Take 1 capsule (300 mg total) by mouth 3 (three) times daily.   geriatric multivitamins-minerals Elix Take 15 mLs by mouth daily.   MULTIVITAMIN ADULT PO Take by mouth.   Insulin Detemir 100 UNIT/ML Pen Commonly known as:  LEVEMIR FLEXPEN Inject 8 Units into the skin daily at 10 pm.   [name] tablet Commonly known as:  COZAAR Take 1 tablet (50 mg total) by mouth daily.   NIACIN (ANTIHYPERLIPIDEMIC) PO Take by mouth as needed.   VITAMIN D (CHOLECALCIFEROL) PO Take by mouth.      Patient Active Problem List   Diagnosis Date Noted  . Substance induced mood disorder (HCC) 01/30/2017  . Alcohol abuse 01/30/2017  . Bipolar 2 disorder (HCC) 01/30/2017  . HTN (hypertension) 03/06/2015  . Dental abscess 08/29/2014  . Psoriasis 05/04/2014      Review of Systems     Objective:   Physical Exam  Constitutional: He is oriented to person, place, and time.  Cardiovascular: Normal rate, regular rhythm and normal heart sounds.  Pulmonary/Chest: Effort normal and breath sounds normal.  Neurological: He is alert and oriented to person, place, and time.    BP 132/88 (BP Location: Left Arm, Patient  Position: Sitting)   Pulse 76   Temp 97.8 F (36.6 C) (Oral)   Wt 216 lb 14.4 oz (98.4 kg)   BMI 32.03 kg/m        Assessment & Plan:   Order labs today.  Call pt in 2 weeks with lab results. [facility] be getting Medicaid [facility]   No f/u appointment needed.

## 2017-07-03 LAB — BASIC METABOLIC PANEL
BUN/Creatinine Ratio: 14 (ref 10–24)
BUN: 14 mg/dL (ref 8–27)
CALCIUM: 9.6 mg/dL (ref 8.6–10.2)
CHLORIDE: 98 mmol/L (ref 96–106)
CO2: 24 mmol/L (ref 20–29)
Creatinine, Ser: 0.97 mg/dL (ref 0.76–1.27)
GFR calc non Af Amer: 82 mL/min/{1.73_m2} (ref >59)
GFR, EST AFRICAN AMERICAN: 95 mL/min/{1.73_m2} (ref >59)
GLUCOSE: 216 mg/dL — AB (ref 65–99)
POTASSIUM: 4.3 mmol/L (ref 3.5–5.2)
Sodium: 138 mmol/L (ref 134–144)

## 2017-07-03 LAB — HEMOGLOBIN A1C
Est. average glucose Bld gHb Est-mCnc: 151 mg/dL
Hgb A1c MFr Bld: 6.9 % — ABNORMAL HIGH (ref 4.8–5.6)

## 2017-07-03 LAB — PROTEIN ELECTROPHORESIS, SERUM
A/G RATIO SPE: 1.2 (ref 0.7–1.7)
ALBUMIN ELP: 3.9 g/dL (ref 2.9–4.4)
Alpha 1: 0.2 g/dL (ref 0.0–0.4)
Alpha 2: 0.9 g/dL (ref 0.4–1.0)
BETA: 1.1 g/dL (ref 0.7–1.3)
GAMMA GLOBULIN: 1 g/dL (ref 0.4–1.8)
Globulin, Total: 3.2 g/dL (ref 2.2–3.9)
TOTAL PROTEIN: 7.1 g/dL (ref 6.0–8.5)

## 2017-07-03 LAB — SEDIMENTATION RATE: Sed Rate: 28 mm/hr (ref 0–30)

## 2017-07-03 LAB — GAMMA GT: GGT: 74 IU/L — ABNORMAL HIGH (ref 0–65)

## 2017-07-03 LAB — LACTATE DEHYDROGENASE: LDH: 180 IU/L (ref 121–224)

## 2017-08-31 ENCOUNTER — Ambulatory Visit (INDEPENDENT_AMBULATORY_CARE_PROVIDER_SITE_OTHER): Payer: Medicare Other | Admitting: Family Medicine

## 2017-08-31 ENCOUNTER — Encounter: Payer: Self-pay | Admitting: Family Medicine

## 2017-08-31 VITALS — BP 132/82 | HR 73 | Temp 97.5°F | Ht 67.7 in | Wt 217.1 lb

## 2017-08-31 DIAGNOSIS — S81802A Unspecified open wound, left lower leg, initial encounter: Secondary | ICD-10-CM

## 2017-08-31 DIAGNOSIS — M1610 Unilateral primary osteoarthritis, unspecified hip: Secondary | ICD-10-CM | POA: Insufficient documentation

## 2017-08-31 DIAGNOSIS — Z13220 Encounter for screening for lipoid disorders: Secondary | ICD-10-CM

## 2017-08-31 DIAGNOSIS — I129 Hypertensive chronic kidney disease with stage 1 through stage 4 chronic kidney disease, or unspecified chronic kidney disease: Secondary | ICD-10-CM | POA: Diagnosis not present

## 2017-08-31 DIAGNOSIS — F3181 Bipolar II disorder: Secondary | ICD-10-CM

## 2017-08-31 DIAGNOSIS — Z23 Encounter for immunization: Secondary | ICD-10-CM

## 2017-08-31 DIAGNOSIS — E669 Obesity, unspecified: Secondary | ICD-10-CM | POA: Diagnosis not present

## 2017-08-31 DIAGNOSIS — L409 Psoriasis, unspecified: Secondary | ICD-10-CM

## 2017-08-31 DIAGNOSIS — I10 Essential (primary) hypertension: Secondary | ICD-10-CM | POA: Diagnosis not present

## 2017-08-31 DIAGNOSIS — R3911 Hesitancy of micturition: Secondary | ICD-10-CM

## 2017-08-31 DIAGNOSIS — E1129 Type 2 diabetes mellitus with other diabetic kidney complication: Secondary | ICD-10-CM | POA: Insufficient documentation

## 2017-08-31 DIAGNOSIS — E119 Type 2 diabetes mellitus without complications: Secondary | ICD-10-CM

## 2017-08-31 DIAGNOSIS — E66811 Obesity, class 1: Secondary | ICD-10-CM

## 2017-08-31 LAB — UA/M W/RFLX CULTURE, ROUTINE
BILIRUBIN UA: NEGATIVE
Glucose, UA: NEGATIVE
Ketones, UA: NEGATIVE
LEUKOCYTES UA: NEGATIVE
NITRITE UA: NEGATIVE
RBC UA: NEGATIVE
SPEC GRAV UA: 1.015 (ref 1.005–1.030)
Urobilinogen, Ur: 0.2 mg/dL (ref 0.2–1.0)
pH, UA: 5 (ref 5.0–7.5)

## 2017-08-31 LAB — MICROSCOPIC EXAMINATION
Bacteria, UA: NONE SEEN
RBC MICROSCOPIC, UA: NONE SEEN /HPF (ref 0–2)

## 2017-08-31 LAB — MICROALBUMIN, URINE WAIVED
Creatinine, Urine Waived: 100 mg/dL (ref 10–300)
Microalb, Ur Waived: 150 mg/L — ABNORMAL HIGH (ref 0–19)
Microalb/Creat Ratio: >300 mg/g — ABNORMAL HIGH (ref <30)

## 2017-08-31 LAB — BAYER DCA HB A1C WAIVED: HB A1C (BAYER DCA - WAIVED): 6.8 % (ref <7.0)

## 2017-08-31 MED ORDER — GABAPENTIN 400 MG PO CAPS: 400.0000 mg | ORAL_CAPSULE | Freq: Three times a day (TID) | ORAL | 3 refills | Status: DC

## 2017-08-31 MED ORDER — LOSARTAN POTASSIUM 50 MG PO TABS: 50.0000 mg | ORAL_TABLET | Freq: Every day | ORAL | 1 refills | Status: DC

## 2017-08-31 NOTE — Patient Instructions (Signed)
Td Vaccine (Tetanus and Diphtheria): What You Need to Know 1. Why get vaccinated? Tetanus  and diphtheria are very serious diseases. They are rare in the [place] today, but people who do become infected often have severe complications. Td vaccine is used to protect adolescents and adults from both of these diseases. Both tetanus and diphtheria are infections caused by bacteria. Diphtheria spreads from person to person through coughing or sneezing. Tetanus-causing bacteria enter the body through cuts, scratches, or wounds. TETANUS (lockjaw) causes painful muscle tightening and stiffness, usually all over the body.  It can lead to tightening of muscles in the head and neck so you can't open your mouth, swallow, or sometimes even breathe. Tetanus kills about 1 out of every 10 people who are infected even after receiving the best medical care.  DIPHTHERIA can cause a thick coating to form in the back of the throat.  It can lead to breathing problems, paralysis, heart failure, and death.  Before vaccines, as many as 200,000 cases of diphtheria and hundreds of cases of tetanus were reported in the [place] each year. Since vaccination began, reports of cases for both diseases have dropped by about 99%. 2. Td vaccine Td vaccine can protect adolescents and adults from tetanus and diphtheria. Td is usually given as a booster dose every 10 years but it can also be given earlier after a severe and dirty wound or burn. Another vaccine, called Tdap, which protects against pertussis in addition to tetanus and diphtheria, is sometimes recommended instead of Td vaccine. Your doctor or the person giving you the vaccine can give you more information. Td may safely be given at the same time as other vaccines. 3. Some people should not get this vaccine  A person who has ever had a life-threatening allergic reaction after a previous dose of any tetanus or diphtheria containing vaccine, OR has a severe  allergy to any part of this vaccine, should not get Td vaccine. Tell the person giving the vaccine about any severe allergies.  Talk to your doctor if you: ? had severe pain or swelling after any vaccine containing diphtheria or tetanus, ? ever had a condition called Guillain Barre Syndrome (GBS), ? aren't feeling well on the day the shot is scheduled. 4. What are the risks from Td vaccine? With any medicine, including vaccines, there is a chance of side effects. These are usually mild and go away on their own. Serious reactions are also possible but are rare. Most people who get Td vaccine do not have any problems with it. Mild problems following Td vaccine: (Did not interfere with activities)  Pain where the shot was given (about 8 people in 10)  Redness or swelling where the shot was given (about 1 person in 4)  Mild fever (rare)  Headache (about 1 person in 4)  Tiredness (about 1 person in 4)  Moderate problems following Td vaccine: (Interfered with activities, but did not require medical attention)  Fever over 102F (rare)  Severe problems following Td vaccine: (Unable to perform usual activities; required medical attention)  Swelling, severe pain, bleeding and/or redness in the arm where the shot was given (rare).  Problems that could happen after any vaccine:  People sometimes faint after a medical procedure, including vaccination. Sitting or lying down for about 15 minutes can help prevent fainting, and injuries caused by a fall. Tell your doctor if you feel dizzy, or have vision changes or ringing in the ears.  Some people get   severe pain in the shoulder and have difficulty moving the arm where a shot was given. This happens very rarely.  Any medication can cause a severe allergic reaction. Such reactions from a vaccine are very rare, estimated at fewer than 1 in a million doses, and would happen within a few minutes to a few hours after the vaccination. As with any  medicine, there is a very remote chance of a vaccine causing a serious injury or death. The safety of vaccines is always being monitored. For more information, visit: http://www.aguilar.org/ 5. What if there is a serious reaction? What should I look for? Look for anything that concerns you, such as signs of a severe allergic reaction, very high fever, or unusual behavior. Signs of a severe allergic reaction can include hives, swelling of the face and throat, difficulty breathing, a fast heartbeat, dizziness, and weakness. These would usually start a few minutes to a few hours after the vaccination. What should I do?  If you think it is a severe allergic reaction or other emergency that can't wait, call 9-1-1 or get the person to the nearest hospital. Otherwise, call your doctor.  Afterward, the reaction should be reported to the Vaccine Adverse Event Reporting System (VAERS). Your doctor might file this report, or you can do it yourself through the VAERS web site at www.vaers.SamedayNews.es, or by calling 229 788 0417. ? VAERS does not give medical advice. 6. The National Vaccine Injury Compensation Program The [employer] Vaccine Injury Compensation Program (VICP) is a federal program that was created to compensate people who may have been injured by certain vaccines. Persons who believe they may have been injured by a vaccine can learn about the program and about filing a claim by calling 450 417 4321 or visiting the [place] website at GoldCloset.com.ee. There is a time limit to file a claim for compensation. 7. How can I learn more?  Ask your doctor. He or she can give you the vaccine package insert or suggest other sources of information.  Call your local or state health department.  Contact the Centers for Disease Control and Prevention (CDC): ? Call 8058385471 (1-800-CDC-INFO) ? Visit CDC's website at http://hunter.com/ CDC Td Vaccine VIS (08/07/15) This information is  not intended to replace advice given to you by your health care provider. Make sure you discuss any questions you have with your health care provider. Document Released: 02/09/2006 Document Revised: 01/03/2016 Document Reviewed: 01/03/2016 Elsevier Interactive Patient Education  2017 [employer] Pneumococcal Conjugate Vaccine (PCV13) What You Need to Know 1. Why get vaccinated? Vaccination can protect both children and adults from pneumococcal disease. Pneumococcal disease is caused by bacteria that can spread from person to person through close contact. It can cause ear infections, and it can also lead to more serious infections of the:  Lungs (pneumonia),  Blood (bacteremia), and  Covering of the brain and spinal cord (meningitis).  Pneumococcal pneumonia is most common among adults. Pneumococcal meningitis can cause deafness and brain damage, and it kills about 1 child in 10 who get it. Anyone can get pneumococcal disease, but children under 60 years of age and adults 74 years and older, people with certain medical conditions, and cigarette smokers are at the highest risk. Before there was a vaccine, the [place] saw:  more than 700 cases of meningitis,  about 13,000 blood infections,  about 5 million ear infections, and  about 200 deaths  in children under 5 each year from pneumococcal disease. Since vaccine became available, severe pneumococcal disease  in these children has fallen by 88%. About 18,000 older adults die of pneumococcal disease each year in the [place]. Treatment of pneumococcal infections with penicillin and other drugs is not as effective as it used to be, because some strains of the disease have become resistant to these drugs. This makes prevention of the disease, through vaccination, even more important. 2. PCV13 vaccine Pneumococcal conjugate vaccine (called PCV13) protects against 13 types of pneumococcal bacteria. PCV13 is routinely given to  children at 2, 4, 6, and 64-73 months of age. It is also recommended for children and adults 60 to 34 years of age with certain health conditions, and for all adults 45 years of age and older. Your doctor can give you details. 3. Some people should not get this vaccine Anyone who has ever had a life-threatening allergic reaction to a dose of this vaccine, to an earlier pneumococcal vaccine called PCV7, or to any vaccine containing diphtheria toxoid (for example, DTaP), should not get PCV13. Anyone with a severe allergy to any component of PCV13 should not get the vaccine. Tell your doctor if the person being vaccinated has any severe allergies. If the person scheduled for vaccination is not feeling well, your healthcare provider might decide to reschedule the shot on another day. 4. Risks of a vaccine reaction With any medicine, including vaccines, there is a chance of reactions. These are usually mild and go away on their own, but serious reactions are also possible. Problems reported following PCV13 varied by age and dose in the series. The most common problems reported among children were:  About half became drowsy after the shot, had a temporary loss of appetite, or had redness or tenderness where the shot was given.  About 1 out of 3 had swelling where the shot was given.  About 1 out of 3 had a mild fever, and about 1 in 20 had a fever over 102.35F.  Up to about 8 out of 10 became fussy or irritable.  Adults have reported pain, redness, and swelling where the shot was given; also mild fever, fatigue, headache, chills, or muscle pain. Young children who get PCV13 along with inactivated flu vaccine at the same time may be at increased risk for seizures caused by fever. Ask your doctor for more information. Problems that could happen after any vaccine:  People sometimes faint after a medical procedure, including vaccination. Sitting or lying down for about 15 minutes can help prevent fainting,  and injuries caused by a fall. Tell your doctor if you feel dizzy, or have vision changes or ringing in the ears.  Some older children and adults get severe pain in the shoulder and have difficulty moving the arm where a shot was given. This happens very rarely.  Any medication can cause a severe allergic reaction. Such reactions from a vaccine are very rare, estimated at about 1 in a million doses, and would happen within a few minutes to a few hours after the vaccination. As with any medicine, there is a very small chance of a vaccine causing a serious injury or death. The safety of vaccines is always being monitored. For more information, visit: http://www.aguilar.org/ 5. What if there is a serious reaction? What should I look for? Look for anything that concerns you, such as signs of a severe allergic reaction, very high fever, or unusual behavior. Signs of a severe allergic reaction can include hives, swelling of the face and throat, difficulty breathing, a fast heartbeat, dizziness, and weakness-usually  within a few minutes to a few hours after the vaccination. What should I do?  If you think it is a severe allergic reaction or other emergency that can't wait, call 9-1-1 or get the person to the nearest hospital. Otherwise, call your doctor.  Reactions should be reported to the Vaccine Adverse Event Reporting System (VAERS). Your doctor should file this report, or you can do it yourself through the VAERS web site at www.vaers.LAgents.no, or by calling 1-(418)514-4136. ? VAERS does not give medical advice. 6. The National Vaccine Injury Compensation Program The [employer] Vaccine Injury Compensation Program (VICP) is a federal program that was created to compensate people who may have been injured by certain vaccines. Persons who believe they may have been injured by a vaccine can learn about the program and about filing a claim by calling 1-740-718-2563 or visiting the VICP website at  SpiritualWord.at. There is a time limit to file a claim for compensation. 7. How can I learn more?  Ask your healthcare provider. He or she can give you the vaccine package insert or suggest other sources of information.  Call your local or state health department.  Contact the Centers for Disease Control and Prevention (CDC): ? Call (915)211-0516 (1-800-CDC-INFO) or ? Visit CDC's website at PicCapture.uy Vaccine Information Statement, PCV13 Vaccine (03/02/2014) This information is not intended to replace advice given to you by your health care provider. Make sure you discuss any questions you have with your health care provider. Document Released: 02/09/2006 Document Revised: 01/03/2016 Document Reviewed: 01/03/2016 Elsevier Interactive Patient Education  2017 [employer].

## 2017-08-31 NOTE — Assessment & Plan Note (Signed)
Needs to have hip replacement. Needs to get psoriasis treated first. Call with any concerns.

## 2017-08-31 NOTE — Assessment & Plan Note (Signed)
Under good control with A1c of 6.8. Call with any concerns. Continue diet and exercise. Recheck levels 3 months.

## 2017-08-31 NOTE — Assessment & Plan Note (Signed)
Stable off medicine. Continue to monitor. Call with any concerns. Continue to monitor.  

## 2017-08-31 NOTE — Assessment & Plan Note (Signed)
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 Will get him into see dermatology for evaluation. Referral generated today.

## 2017-08-31 NOTE — Progress Notes (Signed)
BP 132/82 (BP Location: Left Arm, Cuff Size: Normal)   Pulse 73   Temp (!) 97.5 F (36.4 C)   Ht 5' 7.7" (1.72 m)   Wt 217 lb 2 oz (98.5 kg)   SpO2 95%   BMI 33.31 kg/m    Subjective:    Patient ID: David Blackburn, male    DOB: 06-22-1950, 67 y.o.   MRN: [id number]  HPI: David Blackburn is a 67 y.o. male who presents today to establish care. [facility] but just [facility], so is establishing here.  Chief Complaint  Patient presents with  . Hypertension  . Diabetes  . Establish Care   Was seeing orthopedics for a hip replacement last year, but they didn't do it because of his psoriasis, his teeth  HYPERTENSION Hypertension status: stable  Satisfied with current treatment? yes Duration of hypertension: chronic BP monitoring frequency:  a few times a week BP medication side effects:  no Medication compliance: fair compliance Previous BP meds: losartan Aspirin: yes Recurrent headaches: no Visual changes: no Palpitations: no Dyspnea: no Chest pain: no Lower extremity edema: no Dizzy/lightheaded: no  BIPOLAR Mood status: stable Satisfied with current treatment?: yes Symptom severity: mild  Duration of current treatment : chronic Side effects: no Medication compliance: Not on anything Psychotherapy/counseling: no  Depressed mood: no Anxious mood: no Anhedonia: no Significant weight loss or gain: no Insomnia: no  Fatigue: no Feelings of worthlessness or guilt: no Impaired concentration/indecisiveness: no Suicidal ideations: no Hopelessness: no Crying spells: no Depression screen PHQ 2/9 08/31/2017  Decreased Interest 0  Down, Depressed, Hopeless 0  PHQ - 2 Score 0   DIABETES Hypoglycemic episodes:no Polydipsia/polyuria: no Visual disturbance: no Chest pain: no Paresthesias: no Glucose Monitoring: no Taking Insulin?: no Blood Pressure Monitoring: not checking Retinal Examination: Not up to Date Foot Exam: Up to  Date Diabetic Education: Not Completed Pneumovax: Not up to Date Influenza: Not up to Date Aspirin: yes    Active Ambulatory Problems    Diagnosis Date Noted  . 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 Psoriasis 05/04/2014  . Bipolar 2 disorder (HCC) 01/30/2017  . Diabetes mellitus without complication (HCC) 08/31/2017  . Arthritis of hip 08/31/2017   Resolved Ambulatory Problems    Diagnosis Date Noted  . Dental abscess 08/29/2014  . Substance induced mood disorder (HCC) 01/30/2017  . Alcohol abuse 01/30/2017   Past Medical History:  Diagnosis Date  . Alcohol abuse 01/30/2017  . Dental abscess 08/29/2014  . Diabetes mellitus without complication (HCC)   . Hypertension   . Psoriasis   . Sciatica   . Substance induced mood disorder (HCC) 01/30/2017   Past Surgical History:  Procedure Laterality Date  . TONSILLECTOMY  1961  . VASECTOMY  1980   Outpatient Encounter Medications as of 08/31/2017  Medication Sig Note  . gabapentin (NEURONTIN) 400 MG capsule Take 1 capsule (400 mg total) by mouth 3 (three) times daily.   Marland Kitchen losartan (COZAAR) 50 MG tablet Take 1 tablet (50 mg total) by mouth daily.   . [DISCONTINUED] gabapentin (NEURONTIN) 300 MG capsule Take 1 capsule (300 mg total) by mouth 3 (three) times daily.   . [DISCONTINUED] losartan (COZAAR) 50 MG tablet Take 1 tablet (50 mg total) by mouth daily.   . cyclobenzaprine (FLEXERIL) 10 MG tablet Take 1 tablet (10 mg total) by mouth 3 (three) times daily as needed for muscle spasms. (Patient not taking: Reported on 08/31/2017)   . [DISCONTINUED]  aspirin 81 MG tablet Take 81 mg by mouth as needed for pain.   . [DISCONTINUED] Cinnamon 500 MG capsule Take 500 mg by mouth daily.   . [DISCONTINUED] Flaxseed, Linseed, (FLAX SEEDS PO) Take by mouth.   . [DISCONTINUED] FLUVIRIN SUSP ADM 0.5ML IM UTD 05/07/2016: Received from: External Pharmacy  . [DISCONTINUED] FOLIC ACID PO Take by mouth.   . [DISCONTINUED] geriatric  multivitamins-minerals (ELDERTONIC/GEVRABON) ELIX Take 15 mLs by mouth daily.   . [DISCONTINUED] Insulin Detemir (LEVEMIR FLEXPEN) 100 UNIT/ML Pen Inject 8 Units into the skin daily at 10 pm. (Patient not taking: Reported on 05/07/2016)   . [DISCONTINUED] Multiple Vitamins-Minerals (MULTIVITAMIN ADULT PO) Take by mouth.   . [DISCONTINUED] NIACIN, ANTIHYPERLIPIDEMIC, PO Take by mouth as needed.  05/07/2016: The patient is taking once in awhile.   . [DISCONTINUED] VITAMIN D, CHOLECALCIFEROL, PO Take by mouth.    No facility-administered encounter medications on file as of 08/31/2017.    No Known Allergies Social History   Socioeconomic History  . Marital status: Single    Spouse name: Not on file  . Number of children: Not on file  . Years of education: Not on file  . Highest education level: Not on file  Occupational History  . Not on file  Social Needs  . Financial resource strain: Not on file  . Food insecurity:    Worry: Not on file    Inability: Not on file  . Transportation needs:    Medical: Not on file    Non-medical: Not on file  Tobacco Use  . Smoking status: Former Smoker    Packs/day: 0.01    Types: Cigars    Last attempt to quit: 06/08/2007    Years since quitting: 10.2  . Smokeless tobacco: Former Neurosurgeon  . Tobacco comment: occassionally a cigar  Substance and Sexual Activity  . Alcohol use: Yes    Alcohol/week: 12.6 oz    Types: 21 Glasses of wine per week  . Drug use: Not Currently  . Sexual activity: Not on file  Lifestyle  . Physical activity:    Days per week: Not on file    Minutes per session: Not on file  . Stress: Not on file  Relationships  . Social connections:    Talks on phone: Not on file    Gets together: Not on file    Attends religious service: Not on file    Active member of club or organization: Not on file    Attends meetings of clubs or organizations: Not on file    Relationship status: Not on file  . Intimate partner violence:    Fear  of current or ex partner: Not on file    Emotionally abused: Not on file    Physically abused: Not on file    Forced sexual activity: Not on file  Other Topics Concern  . Not on file  Social History Narrative  . Not on file   Family History  Problem Relation Age of Onset  . Lung cancer Mother   . Hypertension Mother   . Cancer Brother        Brain  . Stroke Maternal Grandmother   . Heart disease Maternal Grandfather   . Cancer Paternal Grandfather        Stomach    Review of Systems  Constitutional: Negative.   Respiratory: Negative.   Cardiovascular: Negative.   Musculoskeletal: Positive for arthralgias, back pain and myalgias. Negative for gait problem, joint swelling, neck pain  and neck stiffness.  Skin: Positive for rash. Negative for color change, pallor and wound.  Neurological: Negative.   Psychiatric/Behavioral: Negative.     Per HPI unless specifically indicated above     Objective:    BP 132/82 (BP Location: Left Arm, Cuff Size: Normal)   Pulse 73   Temp (!) 97.5 F (36.4 C)   Ht 5' 7.7" (1.72 m)   Wt 217 lb 2 oz (98.5 kg)   SpO2 95%   BMI 33.31 kg/m   Wt Readings from Last 3 Encounters:  08/31/17 217 lb 2 oz (98.5 kg)  07/01/17 216 lb 14.4 oz (98.4 kg)  01/30/17 209 lb (94.8 kg)    Physical Exam  Constitutional: He is oriented to person, place, and time. He appears well-developed and well-nourished. No distress.  HENT:  Head: Normocephalic and atraumatic.  Right Ear: Hearing normal.  Left Ear: Hearing normal.  Nose: Nose normal.  Eyes: Conjunctivae and lids are normal. Right eye exhibits no discharge. Left eye exhibits no discharge. No scleral icterus.  Cardiovascular: Normal rate, regular rhythm, normal heart sounds and intact distal pulses. Exam reveals no gallop and no friction rub.  No murmur heard. Pulmonary/Chest: Effort normal and breath sounds normal. No stridor. No respiratory distress. He has no wheezes. He has no rales. He exhibits no  tenderness.  Musculoskeletal: He exhibits tenderness.  Neurological: He is alert and oriented to person, place, and time.  Skin: Skin is warm, dry and intact. Capillary refill takes less than 2 seconds. Rash (psoriatic plaques on his legs and buttocks) noted. He is not diaphoretic. No erythema. No pallor.  Several open wounds on legs- healing well  Psychiatric: He has a normal mood and affect. His speech is normal and behavior is normal. Judgment and thought content normal. Cognition and memory are normal.  Nursing note and vitals reviewed.   Results for orders placed or performed in visit on 07/01/17  Sedimentation rate  Result Value Ref Range   Sed Rate 28 0 - 30 mm/hr  Gamma GT  Result Value Ref Range   GGT 74 (H) 0 - 65 IU/L  Lactate Dehydrogenase (LDH)  Result Value Ref Range   LDH 180 121 - 224 IU/L  Basic Metabolic Panel (BMET)  Result Value Ref Range   Glucose 216 (H) 65 - 99 mg/dL   BUN 14 8 - 27 mg/dL   Creatinine, Ser 1.61 0.76 - 1.27 mg/dL   GFR calc non Af Amer 82 >59 mL/min/1.73   GFR calc Af Amer 95 >59 mL/min/1.73   BUN/Creatinine Ratio 14 10 - 24   Sodium 138 134 - 144 mmol/L   Potassium 4.3 3.5 - 5.2 mmol/L   Chloride 98 96 - 106 mmol/L   CO2 24 20 - 29 mmol/L   Calcium 9.6 8.6 - 10.2 mg/dL  HgB W9U  Result Value Ref Range   Hgb A1c MFr Bld 6.9 (H) 4.8 - 5.6 %   Est. average glucose Bld gHb Est-mCnc 151 mg/dL  Protein Electrophoresis, (serum)  Result Value Ref Range   Total Protein 7.1 6.0 - 8.5 g/dL   Albumin ELP 3.9 2.9 - 4.4 g/dL   Alpha 1 0.2 0.0 - 0.4 g/dL   Alpha 2 0.9 0.4 - 1.0 g/dL   Beta 1.1 0.7 - 1.3 g/dL   Gamma Globulin 1.0 0.4 - 1.8 g/dL   M-Spike, % Not Observed Not Observed g/dL   GLOBULIN, TOTAL 3.2 2.2 - 3.9 g/dL   A/G Ratio 1.2  0.7 - 1.7   Please Note: Comment    Interpretation: Comment       Assessment & Plan:   Problem List Items Addressed This Visit      Endocrine   Diabetes mellitus without complication (HCC)    Under  good control with A1c of 6.8. Call with any concerns. Continue diet and exercise. Recheck levels 3 months.       Relevant Medications   losartan (COZAAR) 50 MG tablet   Other Relevant Orders   Ambulatory referral to Ophthalmology     Musculoskeletal and Integument   Psoriasis    Stopped his hip replacement. Will get him into see dermatology for evaluation. Referral generated today.      Relevant Orders   Ambulatory referral to Dermatology   Arthritis of hip    Needs to have hip replacement. Needs to get psoriasis treated first. Call with any concerns.         Genitourinary   Benign hypertensive renal disease - Primary   Relevant Medications   gabapentin (NEURONTIN) 400 MG capsule   losartan (COZAAR) 50 MG tablet     Other   Bipolar 2 disorder (HCC)    Stable off medicine. Continue to monitor. Call with any concerns. Continue to monitor.       Relevant Orders   CBC with Differential/Platelet   Bayer DCA Hb A1c Waived   Comprehensive metabolic panel   TSH   UA/M w/rflx Culture, Routine    Other Visit Diagnoses    Obesity (BMI 30.0-34.9)       Relevant Orders   Bayer DCA Hb A1c Waived   Comprehensive metabolic panel   TSH   UA/M w/rflx Culture, Routine   Screening for cholesterol level       Labs drawn today. Await results.    Relevant Orders   Lipid Panel w/o Chol/HDL Ratio   Hesitancy       Labs drawn today. Await results.    Relevant Orders   PSA   UA/M w/rflx Culture, Routine   Open wound of left lower extremity, initial encounter       Healing well. Td due- given today.   Relevant Orders   Td : Tetanus/diphtheria >7yo Preservative  free (Completed)   Immunization due       Prevnar given today.   Relevant Orders   Pneumococcal conjugate vaccine 13-valent (Completed)       Follow up plan: Return in about 3 months (around 12/01/2017) for DM visit and welcome to Medicare visit.

## 2017-09-01 ENCOUNTER — Telehealth: Payer: Self-pay | Admitting: Family Medicine

## 2017-09-01 DIAGNOSIS — E1169 Type 2 diabetes mellitus with other specified complication: Secondary | ICD-10-CM | POA: Insufficient documentation

## 2017-09-01 DIAGNOSIS — E785 Hyperlipidemia, unspecified: Principal | ICD-10-CM

## 2017-09-01 LAB — CBC WITH DIFFERENTIAL/PLATELET
BASOS: 1 %
Basophils Absolute: 0.1 10*3/uL (ref 0.0–0.2)
EOS (ABSOLUTE): 0.3 10*3/uL (ref 0.0–0.4)
Eos: 3 %
HEMOGLOBIN: 15.4 g/dL (ref 13.0–17.7)
Hematocrit: 45.3 % (ref 37.5–51.0)
IMMATURE GRANS (ABS): 0 10*3/uL (ref 0.0–0.1)
IMMATURE GRANULOCYTES: 0 %
LYMPHS: 31 %
Lymphocytes Absolute: 2.8 10*3/uL (ref 0.7–3.1)
MCH: 30 pg (ref 26.6–33.0)
MCHC: 34 g/dL (ref 31.5–35.7)
MCV: 88 fL (ref 79–97)
MONOCYTES: 7 %
Monocytes Absolute: 0.7 10*3/uL (ref 0.1–0.9)
NEUTROS ABS: 5.2 10*3/uL (ref 1.4–7.0)
NEUTROS PCT: 58 %
Platelets: 279 10*3/uL (ref 150–379)
RBC: 5.13 x10E6/uL (ref 4.14–5.80)
RDW: 14.6 % (ref 12.3–15.4)
WBC: 9 10*3/uL (ref 3.4–10.8)

## 2017-09-01 LAB — COMPREHENSIVE METABOLIC PANEL
ALBUMIN: 4.8 g/dL (ref 3.6–4.8)
ALT: 63 IU/L — AB (ref 0–44)
AST: 52 IU/L — ABNORMAL HIGH (ref 0–40)
Albumin/Globulin Ratio: 2.2 (ref 1.2–2.2)
Alkaline Phosphatase: 106 IU/L (ref 39–117)
BUN/Creatinine Ratio: 17 (ref 10–24)
BUN: 18 mg/dL (ref 8–27)
Bilirubin Total: 0.7 mg/dL (ref 0.0–1.2)
CALCIUM: 9.6 mg/dL (ref 8.6–10.2)
CO2: 19 mmol/L — ABNORMAL LOW (ref 20–29)
CREATININE: 1.07 mg/dL (ref 0.76–1.27)
Chloride: 98 mmol/L (ref 96–106)
GFR calc Af Amer: 84 mL/min/{1.73_m2} (ref >59)
GFR, EST NON AFRICAN AMERICAN: 72 mL/min/{1.73_m2} (ref >59)
GLUCOSE: 148 mg/dL — AB (ref 65–99)
Globulin, Total: 2.2 g/dL (ref 1.5–4.5)
Potassium: 4.5 mmol/L (ref 3.5–5.2)
SODIUM: 137 mmol/L (ref 134–144)
TOTAL PROTEIN: 7 g/dL (ref 6.0–8.5)

## 2017-09-01 LAB — LIPID PANEL W/O CHOL/HDL RATIO
CHOLESTEROL TOTAL: 246 mg/dL — AB (ref 100–199)
HDL: 35 mg/dL — AB (ref >39)
TRIGLYCERIDES: 494 mg/dL — AB (ref 0–149)

## 2017-09-01 LAB — PSA: PROSTATE SPECIFIC AG, SERUM: 1.3 ng/mL (ref 0.0–4.0)

## 2017-09-01 LAB — TSH: TSH: 2.71 u[IU]/mL (ref 0.450–4.500)

## 2017-09-01 MED ORDER — CYCLOBENZAPRINE HCL 10 MG PO TABS: 10.0000 mg | ORAL_TABLET | Freq: Every day | ORAL | 1 refills | Status: DC

## 2017-09-01 MED ORDER — ATORVASTATIN CALCIUM 40 MG PO TABS: 40.0000 mg | ORAL_TABLET | Freq: Every day | ORAL | 1 refills | Status: DC

## 2017-09-01 NOTE — Telephone Encounter (Signed)
Rxs sent to her pharmacy 

## 2017-09-01 NOTE — Telephone Encounter (Signed)
Please let him know that his labs came back normal except his cholesterol, which came back quite high. I'd like to put him on some cholesterol medicine if he's OK with that- 1 pill 1x a day. It would be atorvastatin. If he's OK with that, let me know and I'll call it in for him. Thanks!

## 2017-09-01 NOTE — Telephone Encounter (Signed)
Patient states that it is fine for him to start on some cholesterol medication, please send the prescription to [facility]  Patient would also like to have a prescription for flexeril sent in.

## 2017-09-28 ENCOUNTER — Ambulatory Visit: Payer: Self-pay | Admitting: *Deleted

## 2017-09-28 NOTE — Telephone Encounter (Signed)
Pt calling stating that he experienced a nose bleed this morning around 8 am. Pt states he noted when he wiped his nose it was bleeding and he he had a moderate amount of bleeding. Pt states he went to lie on the bed and tilted his head back and his bleeding stopped around 9 am and he has not had another episode since that time. Pt states that he has been taking acetaminophen and ibuprofen at night for a while now to help with aches at night. Pt states he just takes only takes one tab of ibuprofen. Pt denies any dizziness, feeling light headed or any other symptoms at this time. Pt given home care advice and advised to return call if symptoms return. Pt verbalized understanding.  Reason for Disposition . [1] Mild-moderate nosebleed AND [2] bleeding stopped now  Answer Assessment - Initial Assessment Questions 1. AMOUNT OF BLEEDING: "How bad is the bleeding?" "How much blood was lost?" "Has the bleeding stopped?"   - MILD: needed a couple tissues   - MODERATE: needed many tissues   - SEVERE: large blood clots, soaked many tissues, lasted more than 30 minutes     Moderate 2. ONSET: "When did the nosebleed start?"      This morning at 8 am 3. FREQUENCY: "How many nosebleeds have you had in the last 24 hours?"      Just once stopped this morning after lying down 4. RECURRENT SYMPTOMS: "Have there been other recent nosebleeds?" If so, ask: "How long did it take you to stop the bleeding?" "What worked best?"      No, just lasted about a hour just tilted head back and was lying  5. CAUSE: "What do you think caused this nosebleed?"     Unsure 6. LOCAL FACTORS: "Do you have any cold symptoms?", "Have you been rubbing or picking at your nose?"     No 7. SYSTEMIC FACTORS: "Do you have high blood pressure or any bleeding problems?"     HTN, no bleeding problems 8. BLOOD THINNERS: "Do you take any blood thinners?" (e.g., coumadin, heparin, aspirin, Plavix)     No but has taken ibuprofen at night 9. OTHER  SYMPTOMS: "Do you have any other symptoms?" (e.g., lightheadedness)     no 10. PREGNANCY: "Is there any chance you are pregnant?" "When was your last menstrual period?"       n/a  Protocols used: NOSEBLEED-A-AH

## 2017-09-30 ENCOUNTER — Encounter: Payer: Self-pay | Admitting: Family Medicine

## 2017-11-04 ENCOUNTER — Ambulatory Visit: Payer: Self-pay

## 2017-11-04 NOTE — Telephone Encounter (Signed)
Returned call to pt. to discuss his symptoms.  Reported intermittent pain in right upper abdomen; estimated the location to be 8" to right of, and 2" above umbilicus.  Reported it has been going on for "a few months."  Denied swelling or protrusion with palpation, or visually examining the area.  Reported he can feel something "firm" that "is very minimal in size."  Reported the pain "gets his attention" when he bends over.  Denied that the pain occurs every time he bends over.  Reported he spoke with another nurse that told him it could be a hernia.   Stated "I don't think it is severe or anything major."  Denied nausea or vomiting, fever/ chills, or change in bowel pattern.  Denied any pain at this time.  Intermittently would change the subject of the call, and nurse would have to ask him to refocus on the abdominal pain symptoms.  Pt. Would intermittently say "are we done with the questions?"  Offered to schedule an appt. For evaluation of symptoms.  Stated "I don't think this is anything serious; I'll keep my appt. In August."  Encouraged to call back with any worsening symptoms.  Pt. Agreed.       Reason for Disposition . Abdominal pain is a chronic symptom (recurrent or ongoing AND present > 4 weeks)  Answer Assessment - Initial Assessment Questions 1. ONSET:  "When did this first appear?"     A few months ago, noticed a pain in right upper abdomen  2. APPEARANCE: "What does it look like?"     Does not see a bulge or protrusion; pain noted with bending  3. SIZE: "How big is it?" (inches, cm or compare to coins, fruit)     "Very minimal in size" 4. LOCATION: "Where exactly is the hernia located?"     Upper right abdomen; approx. 8" to right, and 2" above the umbilicus   5. PATTERN: "Does the swelling come and go, or has it been constant since it started?"     Denied swelling  6. PAIN: "Is there any pain?" If so, ask: "How bad is it?"  (Scale 1-10; or mild, moderate, severe)     Pain with  bending 7. DIAGNOSIS: "Have you been seen by a doctor for this?" "Did the doctor diagnose you as having a hernia?"     Denied  8. OTHER SYMPTOMS: "Do you have any other symptoms?" (e.g., fever, abdominal pain, vomiting)     Denied nausea, vomiting, or change in bowel pattern  Answer Assessment - Initial Assessment Questions 1. LOCATION: "Where does it hurt?"      Right upper abdomen; about 8" to right of, and 2" above the umbilicus 2. RADIATION: "Does the pain shoot anywhere else?" (e.g., chest, back)     No  3. ONSET: "When did the pain begin?" (e.g., minutes, hours or days ago)      A few months ago 4. SUDDEN: "Gradual or sudden onset?"     gradual 5. PATTERN "Does the pain come and go, or is it constant?"    - If constant: "Is it getting better, staying the same, or worsening?"      (Note: Constant means the pain never goes away completely; most serious pain is constant and it progresses)     - If intermittent: "How long does it last?" "Do you have pain now?"     (Note: Intermittent means the pain goes away completely between bouts)     Only noticeable with  bending over, or in sitting position and bending over 6. SEVERITY: "How bad is the pain?"  (e.g., Scale 1-10; mild, moderate, or severe)    - MILD (1-3): doesn't interfere with normal activities, abdomen soft and not tender to touch     - MODERATE (4-7): interferes with normal activities or awakens from sleep, tender to touch     - SEVERE (8-10): excruciating pain, doubled over, unable to do any normal activities      " It gets my attention" 7. RECURRENT SYMPTOM: "Have you ever had this type of abdominal pain before?" If so, ask: "When was the last time?" and "What happened that time?"      no 8. AGGRAVATING FACTORS: "Does anything seem to cause this pain?" (e.g., foods, stress, alcohol)     Position of bending over 9. CARDIAC SYMPTOMS: "Do you have any of the following symptoms: chest pain, difficulty breathing, sweating,  nausea?"     Not asked 10. OTHER SYMPTOMS: "Do you have any other symptoms?" (e.g., fever, vomiting, diarrhea)      Denied nausea/vomiting, fever/ chills, change in bowel pattern  Protocols used: ABDOMINAL PAIN - UPPER-A-AH, HERNIA-A-AH  Message from Maia PettiesKristie S Ortiz sent at 11/04/2017 11:30 AM EDT   Summary: advice, possible hernia   Pt believes he may have a hernia and has some questions for a nurse about if he needs to be seen in August or possibly sooner. Pt not having severe pain or major complications. He was reluctant to discuss concerns with me.

## 2017-11-05 ENCOUNTER — Other Ambulatory Visit: Payer: Self-pay | Admitting: Family Medicine

## 2017-12-02 ENCOUNTER — Ambulatory Visit: Payer: Self-pay | Admitting: Family Medicine

## 2017-12-07 ENCOUNTER — Other Ambulatory Visit: Payer: Self-pay | Admitting: Family Medicine

## 2017-12-07 DIAGNOSIS — I10 Essential (primary) hypertension: Secondary | ICD-10-CM

## 2017-12-09 ENCOUNTER — Ambulatory Visit: Payer: Medicare Other | Admitting: Family Medicine

## 2017-12-16 DIAGNOSIS — H2513 Age-related nuclear cataract, bilateral: Secondary | ICD-10-CM | POA: Diagnosis not present

## 2017-12-16 LAB — HM DIABETES EYE EXAM

## 2018-02-03 ENCOUNTER — Other Ambulatory Visit: Payer: Self-pay | Admitting: Family Medicine

## 2018-02-03 ENCOUNTER — Telehealth: Payer: Self-pay | Admitting: *Deleted

## 2018-02-03 ENCOUNTER — Ambulatory Visit (INDEPENDENT_AMBULATORY_CARE_PROVIDER_SITE_OTHER): Payer: Medicare Other | Admitting: Family Medicine

## 2018-02-03 ENCOUNTER — Encounter: Payer: Self-pay | Admitting: Family Medicine

## 2018-02-03 VITALS — BP 143/89 | HR 76 | Temp 97.5°F | Ht 68.0 in | Wt 209.0 lb

## 2018-02-03 DIAGNOSIS — E785 Hyperlipidemia, unspecified: Secondary | ICD-10-CM | POA: Diagnosis not present

## 2018-02-03 DIAGNOSIS — Z23 Encounter for immunization: Secondary | ICD-10-CM | POA: Diagnosis not present

## 2018-02-03 DIAGNOSIS — Z87891 Personal history of nicotine dependence: Secondary | ICD-10-CM

## 2018-02-03 DIAGNOSIS — E1165 Type 2 diabetes mellitus with hyperglycemia: Secondary | ICD-10-CM

## 2018-02-03 DIAGNOSIS — E1169 Type 2 diabetes mellitus with other specified complication: Secondary | ICD-10-CM

## 2018-02-03 DIAGNOSIS — E119 Type 2 diabetes mellitus without complications: Secondary | ICD-10-CM

## 2018-02-03 DIAGNOSIS — Z114 Encounter for screening for human immunodeficiency virus [HIV]: Secondary | ICD-10-CM

## 2018-02-03 DIAGNOSIS — E1129 Type 2 diabetes mellitus with other diabetic kidney complication: Secondary | ICD-10-CM

## 2018-02-03 DIAGNOSIS — I10 Essential (primary) hypertension: Secondary | ICD-10-CM

## 2018-02-03 DIAGNOSIS — H9193 Unspecified hearing loss, bilateral: Secondary | ICD-10-CM | POA: Insufficient documentation

## 2018-02-03 DIAGNOSIS — Z0000 Encounter for general adult medical examination without abnormal findings: Secondary | ICD-10-CM

## 2018-02-03 DIAGNOSIS — Z1211 Encounter for screening for malignant neoplasm of colon: Secondary | ICD-10-CM

## 2018-02-03 DIAGNOSIS — I129 Hypertensive chronic kidney disease with stage 1 through stage 4 chronic kidney disease, or unspecified chronic kidney disease: Secondary | ICD-10-CM

## 2018-02-03 DIAGNOSIS — Z136 Encounter for screening for cardiovascular disorders: Secondary | ICD-10-CM | POA: Diagnosis not present

## 2018-02-03 DIAGNOSIS — Z7189 Other specified counseling: Secondary | ICD-10-CM | POA: Diagnosis not present

## 2018-02-03 LAB — BAYER DCA HB A1C WAIVED: HB A1C (BAYER DCA - WAIVED): 8 % — ABNORMAL HIGH (ref <7.0)

## 2018-02-03 MED ORDER — LOSARTAN POTASSIUM 50 MG PO TABS: 50.0000 mg | ORAL_TABLET | Freq: Every day | ORAL | 1 refills | Status: DC

## 2018-02-03 MED ORDER — METFORMIN HCL ER 500 MG PO TB24: 500.0000 mg | ORAL_TABLET | Freq: Every day | ORAL | 3 refills | Status: DC

## 2018-02-03 MED ORDER — ATORVASTATIN CALCIUM 40 MG PO TABS: 40.0000 mg | ORAL_TABLET | Freq: Every day | ORAL | 1 refills | Status: DC

## 2018-02-03 NOTE — Telephone Encounter (Signed)
Requested medication (s) are due for refill today: Yes  Requested medication (s) are on the active medication list: Yes  Last refill:  12/07/17  Future visit scheduled: Yes  Notes to clinic:  See request    Requested Prescriptions  Pending Prescriptions Disp Refills   cyclobenzaprine (FLEXERIL) 10 MG tablet [Pharmacy Med Name: CYCLOBENZAPRINE HCL 10 MG TAB] 30 tablet 1    Sig: TAKE 1 TABLET BY MOUTH AT BEDTIME     Not Delegated - Analgesics:  Muscle Relaxants Failed - 02/03/2018 12:00 PM      Failed - This refill cannot be delegated      Passed - Valid encounter within last 6 months    Recent Outpatient Visits          Today Welcome to [facility] preventive visit   [facility] Ackerman, Megan P, DO   5 months ago Essential hypertension   [facility] [place], Oralia Rud, DO      Future Appointments            In 1 month Johnson, Oralia Rud, DO [employer], [facility]

## 2018-02-03 NOTE — Assessment & Plan Note (Signed)
Under better control on recheck. Continue current regimen. Continue to monitor. Call with any concerns.  

## 2018-02-03 NOTE — Assessment & Plan Note (Signed)
Not under good control. A1c too high at 8.0- will start metformin. Follow up for tolerance 1 month.

## 2018-02-03 NOTE — Patient Instructions (Addendum)
Preventative Services:  AAA Screening: Ordered today Health Risk Assessment and Personalized Prevention Plan: Done today Bone Mass Measurements: N/A CVD Screening: Done today Colon Cancer Screening: Cologuard ordered Depression Screening: Done today Diabetes Screening: Done today Glaucoma Screening: See your Eye doctor Hepatitis B vaccine: N/A Hepatitis C screening: Up to date HIV Screening: Done today Flu Vaccine: Done today Lung cancer Screening: Ordered today Obesity Screening: Done today Pneumonia Vaccines (2): Up to date STI Screening: N/A PSA screening: Up to Date   Health Maintenance, Male A healthy lifestyle and preventive care is important for your health and wellness. Ask your health care provider about what schedule of regular examinations is right for you. What should I know about weight and diet? Eat a Healthy Diet  Eat plenty of vegetables, fruits, whole grains, low-fat dairy products, and lean protein.  Do not eat a lot of foods high in solid fats, added sugars, or salt.  Maintain a Healthy Weight Regular exercise can help you achieve or maintain a healthy weight. You should:  Do at least 150 minutes of exercise each week. The exercise should increase your heart rate and make you sweat (moderate-intensity exercise).  Do strength-training exercises at least twice a week.  Watch Your Levels of Cholesterol and Blood Lipids  Have your blood tested for lipids and cholesterol every 5 years starting at 65 years of age. If you are at high risk for heart disease, you should start having your blood tested when you are 65 years old. You may need to have your cholesterol levels checked more often if: ? Your lipid or cholesterol levels are high. ? You are older than 65 years of age. ? You are at high risk for heart disease.  What should I know about cancer screening? Many types of cancers can be detected early and may often be prevented. Lung Cancer  You should be  screened every year for lung cancer if: ? You are a current smoker who has smoked for at least 30 years. ? You are a former smoker who has quit within the past 15 years.  Talk to your health care provider about your screening options, when you should start screening, and how often you should be screened.  Colorectal Cancer  Routine colorectal cancer screening usually begins at 65 years of age and should be repeated every 5-10 years until you are 65 years old. You may need to be screened more often if early forms of precancerous polyps or small growths are found. Your health care provider may recommend screening at an earlier age if you have risk factors for colon cancer.  Your health care provider may recommend using home test kits to check for hidden blood in the stool.  A small camera at the end of a tube can be used to examine your colon (sigmoidoscopy or colonoscopy). This checks for the earliest forms of colorectal cancer.  Prostate and Testicular Cancer  Depending on your age and overall health, your health care provider may do certain tests to screen for prostate and testicular cancer.  Talk to your health care provider about any symptoms or concerns you have about testicular or prostate cancer.  Skin Cancer  Check your skin from head to toe regularly.  Tell your health care provider about any new moles or changes in moles, especially if: ? There is a change in a mole's size, shape, or color. ? You have a mole that is larger than a pencil eraser.  Always use sunscreen. Apply  sunscreen liberally and repeat throughout the day.  Protect yourself by wearing long sleeves, pants, a wide-brimmed hat, and sunglasses when outside.  What should I know about heart disease, diabetes, and high blood pressure?  If you are 3-32 years of age, have your blood pressure checked every 3-5 years. If you are 32 years of age or older, have your blood pressure checked every year. You should have  your blood pressure measured twice-once when you are at a hospital or clinic, and once when you are not at a hospital or clinic. Record the average of the two measurements. To check your blood pressure when you are not at a hospital or clinic, you can use: ? An automated blood pressure machine at a pharmacy. ? A home blood pressure monitor.  Talk to your health care provider about your target blood pressure.  If you are between 21-62 years old, ask your health care provider if you should take aspirin to prevent heart disease.  Have regular diabetes screenings by checking your fasting blood sugar level. ? If you are at a normal weight and have a low risk for diabetes, have this test once every three years after the age of 87. ? If you are overweight and have a high risk for diabetes, consider being tested at a younger age or more often.  A one-time screening for abdominal aortic aneurysm (AAA) by ultrasound is recommended for men aged 76-75 years who are current or former smokers. What should I know about preventing infection? Hepatitis B If you have a higher risk for hepatitis B, you should be screened for this virus. Talk with your health care provider to find out if you are at risk for hepatitis B infection. Hepatitis C Blood testing is recommended for:  Everyone born from [birth year] through 1963.  Anyone with known risk factors for hepatitis C.  Sexually Transmitted Diseases (STDs)  You should be screened each year for STDs including gonorrhea and chlamydia if: ? You are sexually active and are younger than 65 years of age. ? You are older than 65 years of age and your health care provider tells you that you are at risk for this type of infection. ? Your sexual activity has changed since you were last screened and you are at an increased risk for chlamydia or gonorrhea. Ask your health care provider if you are at risk.  Talk with your health care provider about whether you are at high risk  of being infected with HIV. Your health care provider may recommend a prescription medicine to help prevent HIV infection.  What else can I do?  Schedule regular health, dental, and eye exams.  Stay current with your vaccines (immunizations).  Do not use any tobacco products, such as cigarettes, chewing tobacco, and e-cigarettes. If you need help quitting, ask your health care provider.  Limit alcohol intake to no more than 2 drinks per day. One drink equals 12 ounces of beer, 5 ounces of wine, or 1 ounces of hard liquor.  Do not use street drugs.  Do not share needles.  Ask your health care provider for help if you need support or information about quitting drugs.  Tell your health care provider if you often feel depressed.  Tell your health care provider if you have ever been abused or do not feel safe at home. This information is not intended to replace advice given to you by your health care provider. Make sure you discuss any questions you have with  your health care provider. Document Released: 10/11/2007 Document Revised: 12/12/2015 Document Reviewed: 01/16/2015 Elsevier Interactive Patient Education  2018 [employer].  Type 2 Diabetes Mellitus, Self Care, Adult When you have type 2 diabetes (type 2 diabetes mellitus), you must keep your blood sugar (glucose) under control. You can do this with:  Nutrition.  Exercise.  Lifestyle changes.  Medicines or insulin, if needed.  Support from your doctors and others.  How do I manage my blood sugar?  Check your blood sugar level every day, as often as told.  Call your doctor if your blood sugar is above your goal numbers for 2 tests in a row.  Have your A1c (hemoglobin A1c) level checked at least two times a year. Have it checked more often if your doctor tells you to. Your doctor will set treatment goals for you. Generally, you should have these blood sugar levels:  Before meals (preprandial): 80-130 mg/dL (4.4-7.2  mmol/L).  After meals (postprandial): lower than 180 mg/dL (10 mmol/L).  A1c level: less than 7%.  What do I need to know about high blood sugar? High blood sugar is called hyperglycemia. Know the signs of high blood sugar. Signs may include:  Feeling: ? Thirsty. ? Hungry. ? Very tired.  Needing to pee (urinate) more than usual.  Blurry vision.  What do I need to know about low blood sugar? Low blood sugar is called hypoglycemia. This is when blood sugar is at or below 70 mg/dL (3.9 mmol/L). Symptoms may include:  Feeling: ? Hungry. ? Worried or nervous (anxious). ? Sweaty and clammy. ? Confused. ? Dizzy. ? Sleepy. ? Sick to your stomach (nauseous).  Having: ? A fast heartbeat (palpitations). ? A headache. ? A change in your vision. ? Jerky movements that you cannot control (seizure). ? Nightmares. ? Tingling or no feeling (numbness) around the mouth, lips, or tongue.  Having trouble with: ? Talking. ? Paying attention (concentrating). ? Moving (coordination). ? Sleeping.  Shaking.  Passing out (fainting).  Getting upset easily (irritability).  Treating low blood sugar  To treat low blood sugar, eat or drink something sugary right away. If you can think clearly and swallow safely, follow the 15:15 rule:  Take 15 grams of a fast-acting carb (carbohydrate). Some fast-acting carbs are: ? 1 tube of glucose gel. ? 3 sugar tablets (glucose pills). ? 6-8 pieces of hard candy. ? 4 oz (120 mL) of fruit juice. ? 4 oz (120 mL) regular (not diet) soda.  Check your blood sugar 15 minutes after you take the carb.  If your blood sugar is still at or below 70 mg/dL (3.9 mmol/L), take 15 grams of a carb again.  If your blood sugar does not go above 70 mg/dL (3.9 mmol/L) after 3 tries, get help right away.  After your blood sugar goes back to normal, eat a meal or a snack within 1 hour.  Treating very low blood sugar If your blood sugar is at or below 54 mg/dL (3  mmol/L), you have very low blood sugar (severe hypoglycemia). This is an emergency. Do not wait to see if the symptoms will go away. Get medical help right away. Call your local emergency services (911 in the [place] Do not drive yourself to the hospital. If you have very low blood sugar and you cannot eat or drink, you may need a glucagon shot (injection). A family member or friend should learn how to check your blood sugar and how to give you a glucagon shot.  Ask your doctor if you need to have a glucagon shot kit at home. What else is important to manage my diabetes? Medicine Follow these instructions about insulin and diabetes medicines:  Take them as told by your doctor.  Adjust them as told by your doctor.  Do not run out of them.  Having diabetes can raise your risk for other long-term conditions. These include heart or kidney disease. Your doctor may prescribe medicines to help prevent problems from diabetes. Food   Make healthy food choices. These include: ? Chicken, fish, egg whites, and beans. ? Oats, whole wheat, bulgur, brown rice, quinoa, and millet. ? Fresh fruits and vegetables. ? Low-fat dairy products. ? Nuts, avocado, olive oil, and canola oil.  Make a food plan with a specialist (dietitian).  Follow instructions from your doctor about what you cannot eat or drink.  Drink enough fluid to keep your pee (urine) clear or pale yellow.  Eat healthy snacks between healthy meals.  Keep track of carbs that you eat. Read food labels. Learn food serving sizes.  Follow your sick day plan when you cannot eat or drink normally. Make this plan with your doctor so it is ready to use. Activity  Exercise at least 3 times a week.  Do not go more than 2 days without exercising.  Talk with your doctor before you start a new exercise. Your doctor may need to adjust your insulin, medicines, or food. Lifestyle   Do not use any tobacco products. These include cigarettes, chewing  tobacco, and e-cigarettes.If you need help quitting, ask your doctor.  Ask your doctor how much alcohol is safe for you.  Learn to deal with stress. If you need help with this, ask your doctor. Body care  Stay up to date with your shots (immunizations).  Have your eyes and feet checked by a doctor as often as told.  Check your skin and feet every day. Check for cuts, bruises, redness, blisters, or sores.  Brush your teeth and gums two times a day.  Floss at least one time a day.  Go to the dentist least one time every 6 months.  Stay at a healthy weight. General instructions   Take over-the-counter and prescription medicines only as told by your doctor.  Share your diabetes care plan with: ? Your work or school. ? People you live with.  Check your pee (urine) for ketones: ? When you are sick. ? As told by your doctor.  Carry a card or wear jewelry that says that you have diabetes.  Ask your doctor: ? Do I need to meet with a diabetes educator? ? Where can I find a support group for people with diabetes?  Keep all follow-up visits as told by your doctor. This is important. Where to find more information: To learn more about diabetes, visit:  American Diabetes Association: www.diabetes.org  American Association of Diabetes Educators: www.diabeteseducator.org/patient-resources  This information is not intended to replace advice given to you by your health care provider. Make sure you discuss any questions you have with your health care provider. Document Released: 08/06/2015 Document Revised: 09/20/2015 Document Reviewed: 05/18/2015 Elsevier Interactive Patient Education  2018 [employer].  Diabetes Mellitus and Nutrition When you have diabetes (diabetes mellitus), it is very important to have healthy eating habits because your blood sugar (glucose) levels are greatly affected by what you eat and drink. Eating healthy foods in the appropriate amounts, at about the  same times every day, can help you:  Control your blood glucose.  Lower your risk of heart disease.  Improve your blood pressure.  Reach or maintain a healthy weight.  Every person with diabetes is different, and each person has different needs for a meal plan. Your health care provider may recommend that you work with a diet and nutrition specialist (dietitian) to make a meal plan that is best for you. Your meal plan may vary depending on factors such as:  The calories you need.  The medicines you take.  Your weight.  Your blood glucose, blood pressure, and cholesterol levels.  Your activity level.  Other health conditions you have, such as heart or kidney disease.  How do carbohydrates affect me? Carbohydrates affect your blood glucose level more than any other type of food. Eating carbohydrates naturally increases the amount of glucose in your blood. Carbohydrate counting is a method for keeping track of how many carbohydrates you eat. Counting carbohydrates is important to keep your blood glucose at a healthy level, especially if you use insulin or take certain oral diabetes medicines. It is important to know how many carbohydrates you can safely have in each meal. This is different for every person. Your dietitian can help you calculate how many carbohydrates you should have at each meal and for snack. Foods that contain carbohydrates include:  Bread, cereal, rice, pasta, and crackers.  Potatoes and corn.  Peas, beans, and lentils.  Milk and yogurt.  Fruit and juice.  Desserts, such as cakes, cookies, ice cream, and candy.  How does alcohol affect me? Alcohol can cause a sudden decrease in blood glucose (hypoglycemia), especially if you use insulin or take certain oral diabetes medicines. Hypoglycemia can be a life-threatening condition. Symptoms of hypoglycemia (sleepiness, dizziness, and confusion) are similar to symptoms of having too much alcohol. If your health  care provider says that alcohol is safe for you, follow these guidelines:  Limit alcohol intake to no more than 1 drink per day for nonpregnant women and 2 drinks per day for men. One drink equals 12 oz of beer, 5 oz of wine, or 1 oz of hard liquor.  Do not drink on an empty stomach.  Keep yourself hydrated with water, diet soda, or unsweetened iced tea.  Keep in mind that regular soda, juice, and other mixers may contain a lot of sugar and must be counted as carbohydrates.  What are tips for following this plan? Reading food labels  Start by checking the serving size on the label. The amount of calories, carbohydrates, fats, and other nutrients listed on the label are based on one serving of the food. Many foods contain more than one serving per package.  Check the total grams (g) of carbohydrates in one serving. You can calculate the number of servings of carbohydrates in one serving by dividing the total carbohydrates by 15. For example, if a food has 30 g of total carbohydrates, it would be equal to 2 servings of carbohydrates.  Check the number of grams (g) of saturated and trans fats in one serving. Choose foods that have low or no amount of these fats.  Check the number of milligrams (mg) of sodium in one serving. Most people should limit total sodium intake to less than 2,300 mg per day.  Always check the nutrition information of foods labeled as "low-fat" or "nonfat". These foods may be higher in added sugar or refined carbohydrates and should be avoided.  Talk to your dietitian to identify your daily goals for nutrients  listed on the label. Shopping  Avoid buying canned, premade, or processed foods. These foods tend to be high in fat, sodium, and added sugar.  Shop around the outside edge of the grocery store. This includes fresh fruits and vegetables, bulk grains, fresh meats, and fresh dairy. Cooking  Use low-heat cooking methods, such as baking, instead of high-heat  cooking methods like deep frying.  Cook using healthy oils, such as olive, canola, or sunflower oil.  Avoid cooking with butter, cream, or high-fat meats. Meal planning  Eat meals and snacks regularly, preferably at the same times every day. Avoid going long periods of time without eating.  Eat foods high in fiber, such as fresh fruits, vegetables, beans, and whole grains. Talk to your dietitian about how many servings of carbohydrates you can eat at each meal.  Eat 4-6 ounces of lean protein each day, such as lean meat, chicken, fish, eggs, or tofu. 1 ounce is equal to 1 ounce of meat, chicken, or fish, 1 egg, or 1/4 cup of tofu.  Eat some foods each day that contain healthy fats, such as avocado, nuts, seeds, and fish. Lifestyle   Check your blood glucose regularly.  Exercise at least 30 minutes 5 or more days each week, or as told by your health care provider.  Take medicines as told by your health care provider.  Do not use any products that contain nicotine or tobacco, such as cigarettes and e-cigarettes. If you need help quitting, ask your health care provider.  Work with a Social worker or diabetes educator to identify strategies to manage stress and any emotional and social challenges. What are some questions to ask my health care provider?  Do I need to meet with a diabetes educator?  Do I need to meet with a dietitian?  What number can I call if I have questions?  When are the best times to check my blood glucose? Where to find more information:  American Diabetes Association: diabetes.org/food-and-fitness/food  Academy of Nutrition and Dietetics: PokerClues.dk  [employer] of Diabetes and Digestive and Kidney Diseases (NIH): ContactWire.be Summary  A healthy meal plan will help you control your blood glucose and maintain a healthy  lifestyle.  Working with a diet and nutrition specialist (dietitian) can help you make a meal plan that is best for you.  Keep in mind that carbohydrates and alcohol have immediate effects on your blood glucose levels. It is important to count carbohydrates and to use alcohol carefully. This information is not intended to replace advice given to you by your health care provider. Make sure you discuss any questions you have with your health care provider. Document Released: 01/09/2005 Document Revised: 05/19/2016 Document Reviewed: 05/19/2016 Elsevier Interactive Patient Education  2018 [employer]. Metformin extended-release tablets What is this medicine? METFORMIN (met FOR min) is used to treat type 2 diabetes. It helps to control blood sugar. Treatment is combined with diet and exercise. This medicine can be used alone or with other medicines for diabetes. This medicine may be used for other purposes; ask your health care provider or pharmacist if you have questions. COMMON BRAND NAME(S): Fortamet, Glucophage XR, Glumetza What should I tell my health care provider before I take this medicine? They need to know if you have any of these conditions: -anemia -dehydration -heart disease -frequently drink alcohol-containing beverages -kidney disease -liver disease -polycystic ovary syndrome -serious infection or injury -vomiting -an unusual or allergic reaction to metformin, other medicines, foods, dyes, or preservatives -pregnant or trying  to get pregnant -breast-feeding How should I use this medicine? Take this medicine by mouth with a glass of water. Follow the directions on the prescription label. Take this medicine with food. Take your medicine at regular intervals. Do not take your medicine more often than directed. Do not stop taking except on your doctor's advice. Talk to your pediatrician regarding the use of this medicine in children. Special care may be needed. Overdosage: If  you think you have taken too much of this medicine contact a poison control center or emergency room at once. NOTE: This medicine is only for you. Do not share this medicine with others. What if I miss a dose? If you miss a dose, take it as soon as you can. If it is almost time for your next dose, take only that dose. Do not take double or extra doses. What may interact with this medicine? Do not take this medicine with any of the following medications: -dofetilide -certain contrast medicines given before X-rays, CT scans, MRI, or other procedures This medicine may also interact with the following medications: -acetazolamide -certain antiviral medicines for HIV or AIDS or for hepatitis, like adefovir, dolutegravir, emtricitabine, entecavir, lamivudine, paritaprevir, or tenofovir -cimetidine -cobicistat -crizotinib -dichlorphenamide -digoxin -diuretics -male hormones, like estrogens or progestins and birth control pills -glycopyrrolate -isoniazid -lamotrigine -medicines for blood pressure, heart disease, irregular heart beat -memantine -midodrine -methazolamide -morphine -niacin -phenothiazines like chlorpromazine, mesoridazine, prochlorperazine, thioridazine -phenytoin -procainamide -propantheline -quinidine -quinine -ranitidine -ranolazine -steroid medicines like prednisone or cortisone -stimulant medicines for attention disorders, weight loss, or to stay awake -thyroid medicines -topiramate -trimethoprim -trospium -vancomycin -vandetanib -zonisamide This list may not describe all possible interactions. Give your health care provider a list of all the medicines, herbs, non-prescription drugs, or dietary supplements you use. Also tell them if you smoke, drink alcohol, or use illegal drugs. Some items may interact with your medicine. What should I watch for while using this medicine? Visit your doctor or health care professional for regular checks on your progress. A  test called the HbA1C (A1C) will be monitored. This is a simple blood test. It measures your blood sugar control over the last 2 to 3 months. You will receive this test every 3 to 6 months. Learn how to check your blood sugar. Learn the symptoms of low and high blood sugar and how to manage them. Always carry a quick-source of sugar with you in case you have symptoms of low blood sugar. Examples include hard sugar candy or glucose tablets. Make sure others know that you can choke if you eat or drink when you develop serious symptoms of low blood sugar, such as seizures or unconsciousness. They must get medical help at once. Tell your doctor or health care professional if you have high blood sugar. You might need to change the dose of your medicine. If you are sick or exercising more than usual, you might need to change the dose of your medicine. Do not skip meals. Ask your doctor or health care professional if you should avoid alcohol. Many nonprescription cough and cold products contain sugar or alcohol. These can affect blood sugar. This medicine may cause ovulation in premenopausal women who do not have regular monthly periods. This may increase your chances of becoming pregnant. You should not take this medicine if you become pregnant or think you may be pregnant. Talk with your doctor or health care professional about your birth control options while taking this medicine. Contact your doctor or health care  professional right away if think you are pregnant. The tablet shell for some brands of this medicine does not dissolve. This is normal. The tablet shell may appear whole in the stool. This is not a cause for concern. If you are going to need surgery, a MRI, CT scan, or other procedure, tell your doctor that you are taking this medicine. You may need to stop taking this medicine before the procedure. Wear a medical ID bracelet or chain, and carry a card that describes your disease and details of your  medicine and dosage times. What side effects may I notice from receiving this medicine? Side effects that you should report to your doctor or health care professional as soon as possible: -allergic reactions like skin rash, itching or hives, swelling of the face, lips, or tongue -breathing problems -feeling faint or lightheaded, falls -muscle aches or pains -signs and symptoms of low blood sugar such as feeling anxious, confusion, dizziness, increased hunger, unusually weak or tired, sweating, shakiness, cold, irritable, headache, blurred vision, fast heartbeat, loss of consciousness -slow or irregular heartbeat -unusual stomach pain or discomfort -unusually tired or weak Side effects that usually do not require medical attention (report to your doctor or health care professional if they continue or are bothersome): -diarrhea -headache -heartburn -metallic taste in mouth -nausea -stomach gas, upset This list may not describe all possible side effects. Call your doctor for medical advice about side effects. You may report side effects to FDA at 1-800-FDA-1088. Where should I keep my medicine? Keep out of the reach of children. Store at room temperature between 15 and 30 degrees C (59 and 86 degrees F). Protect from light. Throw away any unused medicine after the expiration date. NOTE: This sheet is a summary. It may not cover all possible information. If you have questions about this medicine, talk to your doctor, pharmacist, or health care provider.  2018 Elsevier/Gold Standard (2015-10-24 15:47:35) Atorvastatin tablets What is this medicine? ATORVASTATIN (a TORE va sta tin) is known as a HMG-CoA reductase inhibitor or 'statin'. It lowers the level of cholesterol and triglycerides in the blood. This drug may also reduce the risk of heart attack, stroke, or other health problems in patients with risk factors for heart disease. Diet and lifestyle changes are often used with this drug. This  medicine may be used for other purposes; ask your health care provider or pharmacist if you have questions. COMMON BRAND NAME(S): Lipitor What should I tell my health care provider before I take this medicine? They need to know if you have any of these conditions: -frequently drink alcoholic beverages -history of stroke, TIA -kidney disease -liver disease -muscle aches or weakness -other medical condition -an unusual or allergic reaction to atorvastatin, other medicines, foods, dyes, or preservatives -pregnant or trying to get pregnant -breast-feeding How should I use this medicine? Take this medicine by mouth with a glass of water. Follow the directions on the prescription label. You can take this medicine with or without food. Take your doses at regular intervals. Do not take your medicine more often than directed. Talk to your pediatrician regarding the use of this medicine in children. While this drug may be prescribed for children as young as 75 years old for selected conditions, precautions do apply. Overdosage: If you think you have taken too much of this medicine contact a poison control center or emergency room at once. NOTE: This medicine is only for you. Do not share this medicine with others. What if I  miss a dose? If you miss a dose, take it as soon as you can. If it is almost time for your next dose, take only that dose. Do not take double or extra doses. What may interact with this medicine? Do not take this medicine with any of the following medications: -red yeast rice -telaprevir -telithromycin -voriconazole This medicine may also interact with the following medications: -alcohol -antiviral medicines for HIV or AIDS -boceprevir -certain antibiotics like clarithromycin, erythromycin, troleandomycin -certain medicines for cholesterol like fenofibrate or gemfibrozil -cimetidine -clarithromycin -colchicine -cyclosporine -digoxin -male hormones, like estrogens or  progestins and birth control pills -grapefruit juice -medicines for fungal infections like fluconazole, itraconazole, ketoconazole -niacin -rifampin -spironolactone This list may not describe all possible interactions. Give your health care provider a list of all the medicines, herbs, non-prescription drugs, or dietary supplements you use. Also tell them if you smoke, drink alcohol, or use illegal drugs. Some items may interact with your medicine. What should I watch for while using this medicine? Visit your doctor or health care professional for regular check-ups. You may need regular tests to make sure your liver is working properly. Tell your doctor or health care professional right away if you get any unexplained muscle pain, tenderness, or weakness, especially if you also have a fever and tiredness. Your doctor or health care professional may tell you to stop taking this medicine if you develop muscle problems. If your muscle problems do not go away after stopping this medicine, contact your health care professional. This drug is only part of a total heart-health program. Your doctor or a dietician can suggest a low-cholesterol and low-fat diet to help. Avoid alcohol and smoking, and keep a proper exercise schedule. Do not use this drug if you are pregnant or breast-feeding. Serious side effects to an unborn child or to an infant are possible. Talk to your doctor or pharmacist for more information. This medicine may affect blood sugar levels. If you have diabetes, check with your doctor or health care professional before you change your diet or the dose of your diabetic medicine. If you are going to have surgery tell your health care professional that you are taking this drug. What side effects may I notice from receiving this medicine? Side effects that you should report to your doctor or health care professional as soon as possible: -allergic reactions like skin rash, itching or hives,  swelling of the face, lips, or tongue -dark urine -fever -joint pain -muscle cramps, pain -redness, blistering, peeling or loosening of the skin, including inside the mouth -trouble passing urine or change in the amount of urine -unusually weak or tired -yellowing of eyes or skin Side effects that usually do not require medical attention (report to your doctor or health care professional if they continue or are bothersome): -constipation -heartburn -stomach gas, pain, upset This list may not describe all possible side effects. Call your doctor for medical advice about side effects. You may report side effects to FDA at 1-800-FDA-1088. Where should I keep my medicine? Keep out of the reach of children. Store at room temperature between 20 to 25 degrees C (68 to 77 degrees F). Throw away any unused medicine after the expiration date. NOTE: This sheet is a summary. It may not cover all possible information. If you have questions about this medicine, talk to your doctor, pharmacist, or health care provider.  2018 Elsevier/Gold Standard (2011-03-04 09:60:45)

## 2018-02-03 NOTE — Telephone Encounter (Signed)
Received referral for low dose lung cancer screening CT scan.  Message left at phone number listed in EMR for patient to call either myself or Shawn Perkins back at [phone] to facilitate scheduling the scan.    

## 2018-02-03 NOTE — Progress Notes (Signed)
BP (!) 143/89 (BP Location: Left Arm, Patient Position: Sitting, Cuff Size: Normal)   Pulse 76   Temp (!) 97.5 F (36.4 C)   Ht 5\' 8"  (1.727 m)   Wt 209 lb (94.8 kg)   SpO2 100%   BMI 31.78 kg/m    Subjective:    Patient ID: David Blackburn, male    DOB: [birth year], 67 y.o.   MRN: [id number]  HPI: David Blackburn is a 67 y.o. male presenting on 02/03/2018 for comprehensive medical examination. Current medical complaints include:  HYPERTENSION / HYPERLIPIDEMIA Satisfied with current treatment? yes Duration of hypertension: chronic BP monitoring frequency: not checking BP medication side effects: no Past BP meds: losartan Duration of hyperlipidemia: chronic- stopped taking his cholesterol medicine Cholesterol medication side effects: no Cholesterol supplements: none Past cholesterol medications: atorvastatin Medication compliance: poor compliance Aspirin: no Recent stressors: no Recurrent headaches: no Visual changes: no Palpitations: no Dyspnea: no Chest pain: no Lower extremity edema: no Dizzy/lightheaded: no  DIABETES Hypoglycemic episodes:no Polydipsia/polyuria: no Visual disturbance: no Chest pain: no Paresthesias: no Glucose Monitoring: no Taking Insulin?: no Blood Pressure Monitoring: not checking Retinal Examination: Up to Date Foot Exam: Up to Date Diabetic Education: Completed Pneumovax: prevnar up to date Influenza: Given today Aspirin: no  Interim Problems from his last visit: yes- having trouble hearing  Functional Status Survey: Is the patient deaf or have difficulty hearing?: Yes Does the patient have difficulty seeing, even when wearing glasses/contacts?: No Does the patient have difficulty concentrating, remembering, or making decisions?: No Does the patient have difficulty walking or climbing stairs?: Yes Does the patient have difficulty dressing or bathing?: No Does the patient have difficulty doing errands alone such as visiting a doctor's  office or shopping?: No  FALL RISK: Fall Risk  02/03/2018 08/31/2017  Falls in the past year? No No    Depression Screen Depression screen [facility] 2/9 02/03/2018 08/31/2017  Decreased Interest 0 0  Down, Depressed, Hopeless 0 0  PHQ - 2 Score 0 0  Altered sleeping 3 -  Tired, decreased energy 0 -  Change in appetite 0 -  Feeling bad or failure about yourself  0 -  Trouble concentrating 0 -  Moving slowly or fidgety/restless 0 -  Suicidal thoughts 0 -  PHQ-9 Score 3 -  Difficult doing work/chores Not difficult at all -    Advanced Directives Does not have one- information provided today  Past Medical History:  Past Medical History:  Diagnosis Date  . Alcohol abuse 01/30/2017  . Dental abscess 08/29/2014  . Diabetes mellitus without complication (HCC)   . Hypertension   . Psoriasis   . Sciatica   . Substance induced mood disorder (HCC) 01/30/2017    Surgical History:  Past Surgical History:  Procedure Laterality Date  . TONSILLECTOMY  1961  . VASECTOMY  1980    Medications:  Current Outpatient Medications on File Prior to Visit  Medication Sig  . cyclobenzaprine (FLEXERIL) 10 MG tablet TAKE 1 TABLET BY MOUTH AT BEDTIME  . gabapentin (NEURONTIN) 400 MG capsule TAKE 1 CAPSULE BY MOUTH 3 TIMES DAILY   No current facility-administered medications on file prior to visit.     Allergies:  No Known Allergies  Social History:  Social History   Socioeconomic History  . Marital status: Single    Spouse name: Not on file  . Number of children: Not on file  . Years of education: Not on file  . Highest education level: Not on file  Occupational  History  . Not on file  Social Needs  . Financial resource strain: Not on file  . Food insecurity:    Worry: Not on file    Inability: Not on file  . Transportation needs:    Medical: Not on file    Non-medical: Not on file  Tobacco Use  . Smoking status: Former Smoker    Packs/day: 0.01    Types: Cigars    Last attempt to  quit: 06/08/2007    Years since quitting: 10.6  . Smokeless tobacco: Former Neurosurgeon  . Tobacco comment: occassionally a cigar  Substance and Sexual Activity  . Alcohol use: Yes    Alcohol/week: 21.0 standard drinks    Types: 21 Glasses of wine per week  . Drug use: Not Currently  . Sexual activity: Not on file  Lifestyle  . Physical activity:    Days per week: Not on file    Minutes per session: Not on file  . Stress: Not on file  Relationships  . Social connections:    Talks on phone: Not on file    Gets together: Not on file    Attends religious service: Not on file    Active member of club or organization: Not on file    Attends meetings of clubs or organizations: Not on file    Relationship status: Not on file  . Intimate partner violence:    Fear of current or ex partner: Not on file    Emotionally abused: Not on file    Physically abused: Not on file    Forced sexual activity: Not on file  Other Topics Concern  . Not on file  Social History Narrative  . Not on file   Social History   Tobacco Use  Smoking Status Former Smoker  . Packs/day: 0.01  . Types: Cigars  . Last attempt to quit: 06/08/2007  . Years since quitting: 10.6  Smokeless Tobacco Former Neurosurgeon  Tobacco Comment   occassionally a cigar   Social History   Substance and Sexual Activity  Alcohol Use Yes  . Alcohol/week: 21.0 standard drinks  . Types: 21 Glasses of wine per week    Family History:  Family History  Problem Relation Age of Onset  . Lung cancer Mother   . Hypertension Mother   . Cancer Brother        Brain  . Stroke Maternal Grandmother   . Heart disease Maternal Grandfather   . Cancer Paternal Grandfather        Stomach    Past medical history, surgical history, medications, allergies, family history and social history reviewed with patient today and changes made to appropriate areas of the chart.   Review of Systems  Constitutional: Negative.   HENT: Positive for hearing  loss. Negative for congestion, ear discharge, ear pain, nosebleeds, sinus pain, sore throat and tinnitus.   Eyes: Negative.   Respiratory: Positive for cough. Negative for hemoptysis, sputum production, shortness of breath, wheezing and stridor.   Cardiovascular: Negative.   Gastrointestinal: Positive for abdominal pain (only with bending over). Negative for blood in stool, constipation, diarrhea, heartburn, melena, nausea and vomiting.  Genitourinary: Negative.   Musculoskeletal: Positive for myalgias. Negative for back pain, falls, joint pain and neck pain.  Skin: Negative.   Neurological: Negative.   Endo/Heme/Allergies: Negative.   Psychiatric/Behavioral: Negative.     All other ROS negative except what is listed above and in the HPI.      Objective:  BP (!) 143/89 (BP Location: Left Arm, Patient Position: Sitting, Cuff Size: Normal)   Pulse 76   Temp (!) 97.5 F (36.4 C)   Ht 5\' 8"  (1.727 m)   Wt 209 lb (94.8 kg)   SpO2 100%   BMI 31.78 kg/m   Wt Readings from Last 3 Encounters:  02/03/18 209 lb (94.8 kg)  08/31/17 217 lb 2 oz (98.5 kg)  07/01/17 216 lb 14.4 oz (98.4 kg)     Visual Acuity Screening   Right eye Left eye Both eyes  Without correction:     With correction: 20/25 20/25 20/25     Physical Exam  Constitutional: He is oriented to person, place, and time. He appears well-developed and well-nourished. No distress.  HENT:  Head: Normocephalic and atraumatic.  Right Ear: Hearing, tympanic membrane, external ear and ear canal normal.  Left Ear: Hearing, tympanic membrane, external ear and ear canal normal.  Nose: Nose normal.  Mouth/Throat: Uvula is midline, oropharynx is clear and moist and mucous membranes are normal. No oropharyngeal exudate.  Eyes: Pupils are equal, round, and reactive to light. Conjunctivae, EOM and lids are normal. Right eye exhibits no discharge. Left eye exhibits no discharge. No scleral icterus.  Neck: Normal range of motion. Neck  supple. No JVD present. No tracheal deviation present. No thyromegaly present.  Cardiovascular: Normal rate, regular rhythm, normal heart sounds and intact distal pulses. Exam reveals no gallop and no friction rub.  No murmur heard. Pulmonary/Chest: Effort normal and breath sounds normal. No stridor. No respiratory distress. He has no wheezes. He has no rales. He exhibits no tenderness.  Abdominal: Soft. Bowel sounds are normal. He exhibits no distension and no mass. There is no tenderness. There is no rebound and no guarding. No hernia.  Genitourinary:  Genitourinary Comments: Genital and rectal exams deferred with shared decision making  Musculoskeletal: Normal range of motion. He exhibits no edema, tenderness or deformity.  Lymphadenopathy:    He has no cervical adenopathy.  Neurological: He is alert and oriented to person, place, and time. He displays normal reflexes. No cranial nerve deficit or sensory deficit. He exhibits normal muscle tone. Coordination normal.  Skin: Skin is warm, dry and intact. Capillary refill takes less than 2 seconds. No rash noted. He is not diaphoretic. No erythema. No pallor.  Psychiatric: He has a normal mood and affect. His speech is normal and behavior is normal. Judgment and thought content normal. Cognition and memory are normal.  Nursing note and vitals reviewed.   6CIT Screen 02/03/2018  What Year? 0 points  What month? 0 points  What time? 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 A1c too high at 8.0- will start metformin. Follow up for  tolerance 1 month.       Relevant Medications   metFORMIN (GLUCOPHAGE-XR) 500 MG 24 hr tablet   losartan (COZAAR) 50 MG tablet   atorvastatin (LIPITOR) 40 MG tablet   Hyperlipidemia associated with type 2  diabetes mellitus (HCC)    Stopped taking his atorvastatin. Will restart and recheck in 1 month.       Relevant Medications   metFORMIN (GLUCOPHAGE-XR) 500 MG 24 hr tablet   losartan (COZAAR) 50 MG tablet   atorvastatin (LIPITOR) 40 MG tablet   Other Relevant Orders   Comprehensive metabolic panel   Lipid Panel w/o Chol/HDL Ratio     Nervous and Auditory   Bilateral hearing loss    Referral to audiologist made today.      Relevant Orders   Ambulatory referral to Audiology     Genitourinary   Benign hypertensive renal disease    Under better control on recheck. Continue current regimen. Continue to monitor. Call with any concerns.       Relevant Medications   losartan (COZAAR) 50 MG tablet   Other Relevant Orders   Comprehensive metabolic panel     Other   Advanced directives, counseling/discussion    A voluntary discussion about advance care planning including the explanation and discussion of advance directives was extensively discussed  with the patient for 10 minutes with patient.  Explanation about the health care proxy and Living will was reviewed and packet with forms with explanation of how to fill them out was given.  During this discussion, the patient was not able to identify a health care proxy and plans fill out the paperwork required.  Patient was offered a separate Advance Care Planning visit for further assistance with forms.          Other Visit Diagnoses    Welcome to Medicare preventive visit    -  Primary   Preventative care discussed today as below.    Relevant Orders   US ABDOMINAL AORTA SCREENING AAA   Screening for HIV without presence of risk factors       Labs drawn today. Await results.    Relevant Orders   HIV Antibody (routine testing w  rflx)   Screening for cardiovascular condition       EKG done today and normal.    Relevant Orders   EKG 12-Lead (Completed)   Immunization due       Flu shot given today.   Relevant Orders   Flu vaccine HIGH DOSE PF (Fluzone High dose) (Completed)   History of tobacco abuse       Quit a year ago- smoked a pack a day off and on since he was 61- will get him in touch with Glenna Fellows.    Relevant Orders   US ABDOMINAL AORTA SCREENING AAA   Screening for colon cancer       Cologuard ordered today.    Relevant Orders   Cologuard   Hypertension, unspecified type       Relevant Medications   losartan (COZAAR) 50 MG tablet   atorvastatin (LIPITOR) 40 MG tablet      Preventative Services:  AAA Screening: Ordered today Health Risk Assessment and Personalized Prevention Plan: Done today Bone Mass Measurements: N/A CVD Screening: Done today Colon Cancer Screening: Cologuard ordered Depression Screening: Done today Diabetes Screening: Done today Glaucoma Screening: See your Eye doctor Hepatitis B vaccine: N/A Hepatitis C screening: Up to date HIV Screening: Done today Flu Vaccine: Done today Lung cancer Screening: Ordered today Obesity Screening: Done today Pneumonia Vaccines (2): Up to date STI Screening: N/A PSA screening: Up to Date  LABORATORY TESTING:  Health maintenance labs ordered today as discussed above.   The natural history of prostate cancer and  ongoing controversy regarding screening and potential treatment outcomes of prostate cancer has been discussed with the patient. The meaning of a false positive PSA and a false negative PSA has been discussed. He indicates understanding of the limitations of this screening test and wishes to proceed with screening PSA testing.   IMMUNIZATIONS:   - Tdap: Tetanus vaccination status reviewed: last tetanus booster within 10 years. - Influenza: Administered today - Pneumovax: Not applicable - Prevnar: Up to  date  SCREENING: - Colonoscopy: Cologuard ordered today  Discussed with patient purpose of the colonoscopy is to detect colon cancer at curable precancerous or early stages   - AAA Screening: Ordered today  -Hearing Test: Ordered today   PATIENT COUNSELING:    Sexuality: Discussed sexually transmitted diseases, partner selection, use of condoms, avoidance of unintended pregnancy  and contraceptive alternatives.   Advised to avoid cigarette smoking.  I discussed with the patient that most people either abstain from alcohol or drink within safe limits (<=14/week and <=4 drinks/occasion for males, <=7/weeks and <= 3 drinks/occasion for females) and that the risk for alcohol disorders and other health effects rises proportionally with the number of drinks per week and how often a drinker exceeds daily limits.  Discussed cessation/primary prevention of drug use and availability of treatment for abuse.   Diet: Encouraged to adjust caloric intake to maintain  or achieve ideal body weight, to reduce intake of dietary saturated fat and total fat, to limit sodium intake by avoiding high sodium foods and not adding table salt, and to maintain adequate dietary potassium and calcium preferably from fresh fruits, vegetables, and low-fat dairy products.    stressed the importance of regular exercise  Injury prevention: Discussed safety belts, safety helmets, smoke detector, smoking near bedding or upholstery.   Dental health: Discussed importance of regular tooth brushing, flossing, and dental visits.   Follow up plan: NEXT PREVENTATIVE PHYSICAL DUE IN 1 YEAR. Return in about 4 weeks (around 03/03/2018) for Follow up CHolesterol and DM.

## 2018-02-03 NOTE — Assessment & Plan Note (Signed)
Stopped taking his atorvastatin. Will restart and recheck in 1 month.

## 2018-02-03 NOTE — Assessment & Plan Note (Signed)
Referral to audiologist made today.

## 2018-02-03 NOTE — Assessment & Plan Note (Signed)
A voluntary discussion about advance care planning including the explanation and discussion of advance directives was extensively discussed  with the patient for 10 minutes with patient.  Explanation about the health care proxy and Living will was reviewed and packet with forms with explanation of how to fill them out was given.  During this discussion, the patient was not able to identify a health care proxy and plans fill out the paperwork required.  Patient was offered a separate Advance Care Planning visit for further assistance with forms.

## 2018-02-04 ENCOUNTER — Telehealth: Payer: Self-pay | Admitting: *Deleted

## 2018-02-04 ENCOUNTER — Encounter: Payer: Self-pay | Admitting: Family Medicine

## 2018-02-04 LAB — LIPID PANEL W/O CHOL/HDL RATIO
Cholesterol, Total: 167 mg/dL (ref 100–199)
HDL: 37 mg/dL — AB (ref >39)
LDL Calculated: 101 mg/dL — ABNORMAL HIGH (ref 0–99)
TRIGLYCERIDES: 144 mg/dL (ref 0–149)
VLDL CHOLESTEROL CAL: 29 mg/dL (ref 5–40)

## 2018-02-04 LAB — COMPREHENSIVE METABOLIC PANEL
ALT: 115 IU/L — AB (ref 0–44)
AST: 81 IU/L — ABNORMAL HIGH (ref 0–40)
Albumin/Globulin Ratio: 1.7 (ref 1.2–2.2)
Albumin: 4.5 g/dL (ref 3.6–4.8)
Alkaline Phosphatase: 92 IU/L (ref 39–117)
BUN/Creatinine Ratio: 10 (ref 10–24)
BUN: 11 mg/dL (ref 8–27)
Bilirubin Total: 1.1 mg/dL (ref 0.0–1.2)
CALCIUM: 9.9 mg/dL (ref 8.6–10.2)
CO2: 22 mmol/L (ref 20–29)
CREATININE: 1.1 mg/dL (ref 0.76–1.27)
Chloride: 98 mmol/L (ref 96–106)
GFR, EST AFRICAN AMERICAN: 81 mL/min/{1.73_m2} (ref >59)
GFR, EST NON AFRICAN AMERICAN: 70 mL/min/{1.73_m2} (ref >59)
GLUCOSE: 165 mg/dL — AB (ref 65–99)
Globulin, Total: 2.6 g/dL (ref 1.5–4.5)
Potassium: 4.3 mmol/L (ref 3.5–5.2)
Sodium: 139 mmol/L (ref 134–144)
TOTAL PROTEIN: 7.1 g/dL (ref 6.0–8.5)

## 2018-02-04 LAB — HIV ANTIBODY (ROUTINE TESTING W REFLEX): HIV SCREEN 4TH GENERATION: NONREACTIVE

## 2018-02-04 NOTE — Telephone Encounter (Signed)
Received referral for low dose lung cancer screening CT scan.  Message left at phone number listed in EMR for patient to call either myself or Shawn Perkins back at [phone] to facilitate scheduling the scan.    

## 2018-02-05 ENCOUNTER — Ambulatory Visit
Admission: RE | Admit: 2018-02-05 | Discharge: 2018-02-05 | Disposition: A | Discharge status: Home / Self Care | Payer: Medicare Other | Source: Ambulatory Visit | Attending: Family Medicine | Admitting: Family Medicine

## 2018-02-05 ENCOUNTER — Telehealth: Payer: Self-pay | Admitting: *Deleted

## 2018-02-05 ENCOUNTER — Ambulatory Visit: Payer: Self-pay

## 2018-02-05 ENCOUNTER — Telehealth: Payer: Self-pay | Admitting: Family Medicine

## 2018-02-05 ENCOUNTER — Encounter: Payer: Self-pay | Admitting: *Deleted

## 2018-02-05 DIAGNOSIS — Z136 Encounter for screening for cardiovascular disorders: Secondary | ICD-10-CM | POA: Diagnosis not present

## 2018-02-05 DIAGNOSIS — Z87891 Personal history of nicotine dependence: Secondary | ICD-10-CM | POA: Diagnosis not present

## 2018-02-05 DIAGNOSIS — Z0000 Encounter for general adult medical examination without abnormal findings: Secondary | ICD-10-CM | POA: Diagnosis not present

## 2018-02-05 NOTE — Telephone Encounter (Signed)
Please let him know that his [place] was normal. No sign of AAA. Thanks!

## 2018-02-05 NOTE — Telephone Encounter (Signed)
Called and left a detailed message letting patient know that his [place] was normal.

## 2018-02-05 NOTE — Telephone Encounter (Signed)
Received referral for low dose lung cancer screening CT scan.  Message left at phone number listed in EMR for patient to call either myself or Shawn Perkins back at [phone] to facilitate scheduling the scan.    

## 2018-02-15 ENCOUNTER — Telehealth: Payer: Self-pay | Admitting: *Deleted

## 2018-02-15 DIAGNOSIS — Z87891 Personal history of nicotine dependence: Secondary | ICD-10-CM

## 2018-02-15 DIAGNOSIS — Z122 Encounter for screening for malignant neoplasm of respiratory organs: Secondary | ICD-10-CM

## 2018-02-15 NOTE — Telephone Encounter (Signed)
Received referral for initial lung cancer screening scan. Contacted patient and obtained smoking history,(former, quit 02/02/17, 38 pack year) as well as answering questions related to screening process. Patient denies signs of lung cancer such as weight loss or hemoptysis. Patient denies comorbidity that would prevent curative treatment if lung cancer were found. Patient is scheduled for shared decision making visit and CT scan on 03/11/18 at 145pm.

## 2018-02-23 ENCOUNTER — Ambulatory Visit: Payer: Medicare Other | Admitting: Family Medicine

## 2018-03-01 ENCOUNTER — Telehealth: Payer: Self-pay | Admitting: Family Medicine

## 2018-03-01 NOTE — Telephone Encounter (Signed)
Copied from CRM [id number]. Topic: Quick Communication - Rx Refill/Question >> Mar 01, 2018 10:13 AM Arlyss Gandy, NT wrote: Medication: cyclobenzaprine (FLEXERIL) 10 MG tablet  Pt states he has been taking more than one at bedtime, and he is out of medication. He states also with taking OTC pain meds it is not helping. He is also wanting to see if he can get something stronger for pain.f  Has the patient contacted their pharmacy? Yes.   (Agent: If no, request that the patient contact the pharmacy for the refill.) (Agent: If yes, when and what did the pharmacy advise?)  Preferred Pharmacy (with phone number or street name): [facility] - [place], [place] - [address] [phone] (Phone) [phone] (Fax)    Agent: Please be advised that RX refills may take up to 3 business days. We ask that you follow-up with your pharmacy.

## 2018-03-01 NOTE — Telephone Encounter (Signed)
Noted. We still do not give out pain medication without an appointment.

## 2018-03-01 NOTE — Telephone Encounter (Signed)
He cannot take more than 1- that is over the recommended dose. He will need to be seen for any other medication to discuss his pain.

## 2018-03-01 NOTE — Telephone Encounter (Signed)
Patient returning call to office just wanting to talk with some one. Pt denies having suicidal ideations at this time. Triage nurse,Shannon also listened to call with the pt. Per advise of triage nurse pt given Behavioral Health number to discuss current issues.

## 2018-03-01 NOTE — Telephone Encounter (Signed)
Called and spoke to patient. Dr. Henriette Combs message relayed. Pt agreed to wait for his upcoming appointment scheduled for 03/08/2018 @ 9:30 with Dr. Laural Benes, but at the same time patient seemed upset and stated that he does not know what to do, if he needs to keep seeing his PCP or go to see an specialist to treat his pain. Patient stated he does not care if he dies today or tomorrow because he is tired of everything. Patient states that he understand that everything is about the money and insurance but the only thing he wants is a refill for cyclobenzaprine (Flexeril). Also, patient states that when he comes for his next appointment, he will talk to Dr, Laural Benes about how he is feeling. Stated that that might be the last time he will be coming to [facility] because he is getting tired about how things are being handled. Pt stated that we might be losing him as a patient.

## 2018-03-02 MED ORDER — QUETIAPINE FUMARATE ER 50 MG PO TB24: 50.0000 mg | ORAL_TABLET | Freq: Every day | ORAL | 3 refills | Status: DC

## 2018-03-02 NOTE — Telephone Encounter (Signed)
Patient agreed upon Seroquel till his appointment on the 11th.  [facility]

## 2018-03-02 NOTE — Telephone Encounter (Signed)
Can you please ask him if he'd like me to send in some low dose seroquel to help regulate his moods until I see him on the 11th if he doesn't want to come in sooner?

## 2018-03-02 NOTE — Telephone Encounter (Signed)
Pt called back. °

## 2018-03-02 NOTE — Addendum Note (Signed)
Addended by: Dorcas Carrow on: 03/02/2018 10:33 AM   Modules accepted: Orders

## 2018-03-02 NOTE — Telephone Encounter (Signed)
Called patient, no answer. LVM for patient to return call or someone would try him again later.

## 2018-03-08 ENCOUNTER — Ambulatory Visit (INDEPENDENT_AMBULATORY_CARE_PROVIDER_SITE_OTHER): Payer: Medicare Other | Admitting: Family Medicine

## 2018-03-08 ENCOUNTER — Encounter: Payer: Self-pay | Admitting: Family Medicine

## 2018-03-08 VITALS — BP 139/91 | HR 71 | Temp 98.5°F | Wt 214.3 lb

## 2018-03-08 DIAGNOSIS — E785 Hyperlipidemia, unspecified: Secondary | ICD-10-CM | POA: Diagnosis not present

## 2018-03-08 DIAGNOSIS — E1129 Type 2 diabetes mellitus with other diabetic kidney complication: Secondary | ICD-10-CM | POA: Diagnosis not present

## 2018-03-08 DIAGNOSIS — E1165 Type 2 diabetes mellitus with hyperglycemia: Secondary | ICD-10-CM | POA: Diagnosis not present

## 2018-03-08 DIAGNOSIS — L409 Psoriasis, unspecified: Secondary | ICD-10-CM

## 2018-03-08 DIAGNOSIS — E1169 Type 2 diabetes mellitus with other specified complication: Secondary | ICD-10-CM

## 2018-03-08 DIAGNOSIS — M1610 Unilateral primary osteoarthritis, unspecified hip: Secondary | ICD-10-CM

## 2018-03-08 DIAGNOSIS — F3181 Bipolar II disorder: Secondary | ICD-10-CM

## 2018-03-08 MED ORDER — NAPROXEN 500 MG PO TABS: 500.0000 mg | ORAL_TABLET | Freq: Two times a day (BID) | ORAL | 6 refills | Status: DC

## 2018-03-08 MED ORDER — LURASIDONE HCL 80 MG PO TABS: 80.0000 mg | ORAL_TABLET | Freq: Every day | ORAL | 2 refills | Status: DC

## 2018-03-08 NOTE — Assessment & Plan Note (Signed)
Not under good control. Not feeling well. Will get him back into see orthopedics- would like to see [facility] for ease of transportation. Referral generated today, he will see them on Wednesday. Naproxen given. Continue gabapentin. Will avoid flexeril as it wasn't really working and patient was taking too much of it. Await input from ortho.

## 2018-03-08 NOTE — Assessment & Plan Note (Signed)
Not doing well. Will continue seroquel for now and then stop when on latuda. Start latuda. Recheck 1 month. Call with any concerns.

## 2018-03-08 NOTE — Assessment & Plan Note (Signed)
Rechecking levels today. Tolerating atorvastatin well. Call with any concerns.

## 2018-03-08 NOTE — Assessment & Plan Note (Signed)
Tolerating his metformin well. Continue current regimen. Continue to monitor. Call with any concerns. Due to recheck A1c in 2 months.

## 2018-03-08 NOTE — Assessment & Plan Note (Signed)
Not well controlled on his buttocks. Does not want to see dermatology right now. Will call if he changes his mind.

## 2018-03-08 NOTE — Progress Notes (Signed)
BP (!) 139/91 (BP Location: Left Arm, Cuff Size: Large)   Pulse 71   Temp 98.5 F (36.9 C) (Oral)   Wt 214 lb 4.8 oz (97.2 kg)   SpO2 95%   BMI 32.58 kg/m    Subjective:    Patient ID: David Blackburn, male    DOB: 1950/06/20, 67 y.o.   MRN: [id number]  HPI: David Blackburn is a 67 y.o. male  Chief Complaint  Patient presents with  . Depression  . Diabetes  . Hyperlipidemia   Started on metformin last visit for A1c of 8.0. Tolerating the metformin well. No concerns.   HYPERLIPIDEMIA-Had not been taking his atorvastatin last visit- restarted last visit.  Hyperlipidemia status: excellent compliance Satisfied with current treatment?  yes Side effects:  no Medication compliance: excellent compliance Past cholesterol meds: atorvastatin Supplements: none Aspirin:  no The 10-year ASCVD risk score Denman George DC Jr., et al., 2013) is: 41.2%   Values used to calculate the score:     Age: 71 years     Sex: Male     Is Non-Hispanic African American: No     Diabetic: Yes     Tobacco smoker: Yes     Systolic Blood Pressure: 139 mmHg     Is BP treated: Yes     HDL Cholesterol: 37 mg/dL     Total Cholesterol: 167 mg/dL Chest pain:  no  DEPRESSION- hadn't been feeling like himself, started on some seroquel since last visit. Helping him sleep, but not necessarily helping with his mood Mood status: uncontrolled Satisfied with current treatment?: no Symptom severity: moderate  Duration of current treatment : couple of days Side effects: no Medication compliance: good compliance Psychotherapy/counseling: no  Depressed mood: yes Anxious mood: yes Anhedonia: no Significant weight loss or gain: no Insomnia: yes hard to stay asleep Fatigue: yes Feelings of worthlessness or guilt: yes Impaired concentration/indecisiveness: no Suicidal ideations: no Hopelessness: no Crying spells: yes Depression screen [facility] 2/9 03/08/2018 02/03/2018 08/31/2017  Decreased Interest 2 0 0  Down, Depressed,  Hopeless 0 0 0  PHQ - 2 Score 2 0 0  Altered sleeping 2 3 -  Tired, decreased energy 1 0 -  Change in appetite 1 0 -  Feeling bad or failure about yourself  2 0 -  Trouble concentrating 1 0 -  Moving slowly or fidgety/restless 1 0 -  Suicidal thoughts 0 0 -  PHQ-9 Score 10 3 -  Difficult doing work/chores Very difficult Not difficult at all -   Notes that he has been in a lot of pain. 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 He has had pain for about 12 years and was seeing orthopedics until about a year ago in [place]. They discussed hip replacement at that time, but he needed to modify several risks for his surgery- his psoriasis, quitting smoking, and having dental extractions to avoid potential infections. He has not done any of these things. He was referred to dermatology at [facility] in [place] in May. He cancelled his appointment with dermatology. He notes that his pain in his hip has been worse. He has been walking with a cane. He had seen dermatology- notes not available and he notes that he got a strong cream that seemed to make his psoriasis worse.   Relevant past medical, surgical, family and social history reviewed and updated as indicated. Interim medical history since our last visit reviewed. Allergies and medications reviewed and updated.  Review of Systems  Constitutional: Negative.  Respiratory: Negative.   Cardiovascular: Negative.   Musculoskeletal: Positive for arthralgias. Negative for back pain, gait problem, joint swelling, myalgias, neck pain and neck stiffness.  Skin: Negative.   Neurological: Negative.   Psychiatric/Behavioral: Positive for agitation and dysphoric mood. Negative for behavioral problems, confusion, decreased concentration, hallucinations, self-injury, sleep disturbance and suicidal ideas. The patient is nervous/anxious. The patient is not hyperactive.     Per HPI unless specifically indicated above     Objective:    BP (!) 139/91 (BP  Location: Left Arm, Cuff Size: Large)   Pulse 71   Temp 98.5 F (36.9 C) (Oral)   Wt 214 lb 4.8 oz (97.2 kg)   SpO2 95%   BMI 32.58 kg/m   Wt Readings from Last 3 Encounters:  03/08/18 214 lb 4.8 oz (97.2 kg)  02/03/18 209 lb (94.8 kg)  08/31/17 217 lb 2 oz (98.5 kg)    Physical Exam  Constitutional: He is oriented to person, place, and time. He appears well-developed and well-nourished. No distress.  HENT:  Head: Normocephalic and atraumatic.  Right Ear: Hearing normal.  Left Ear: Hearing normal.  Nose: Nose normal.  Eyes: Conjunctivae and lids are normal. Right eye exhibits no discharge. Left eye exhibits no discharge. No scleral icterus.  Cardiovascular: Normal rate, regular rhythm, normal heart sounds and intact distal pulses. Exam reveals no gallop and no friction rub.  No murmur heard. Pulmonary/Chest: Effort normal and breath sounds normal. No stridor. No respiratory distress. He has no wheezes. He has no rales. He exhibits no tenderness.  Musculoskeletal: He exhibits tenderness.  Antalgic gait  Neurological: He is alert and oriented to person, place, and time.  Skin: Skin is warm, dry and intact. Capillary refill takes less than 2 seconds. No rash noted. He is not diaphoretic. No erythema. No pallor.  Psychiatric: He has a normal mood and affect. His speech is normal and behavior is normal. Judgment and thought content normal. Cognition and memory are normal.    Results for orders placed or performed in visit on 02/03/18  Bayer DCA Hb A1c Waived  Result Value Ref Range   HB A1C (BAYER DCA - WAIVED) 8.0 (H) <7.0 %  Comprehensive metabolic panel  Result Value Ref Range   Glucose 165 (H) 65 - 99 mg/dL   BUN 11 8 - 27 mg/dL   Creatinine, Ser 8.29 0.76 - 1.27 mg/dL   GFR calc non Af Amer 70 >59 mL/min/1.73   GFR calc Af Amer 81 >59 mL/min/1.73   BUN/Creatinine Ratio 10 10 - 24   Sodium 139 134 - 144 mmol/L   Potassium 4.3 3.5 - 5.2 mmol/L   Chloride 98 96 - 106 mmol/L     CO2 22 20 - 29 mmol/L   Calcium 9.9 8.6 - 10.2 mg/dL   Total Protein 7.1 6.0 - 8.5 g/dL   Albumin 4.5 3.6 - 4.8 g/dL   Globulin, Total 2.6 1.5 - 4.5 g/dL   Albumin/Globulin Ratio 1.7 1.2 - 2.2   Bilirubin Total 1.1 0.0 - 1.2 mg/dL   Alkaline Phosphatase 92 39 - 117 IU/L   AST 81 (H) 0 - 40 IU/L   ALT 115 (H) 0 - 44 IU/L  Lipid Panel w/o Chol/HDL Ratio  Result Value Ref Range   Cholesterol, Total 167 100 - 199 mg/dL   Triglycerides 562 0 - 149 mg/dL   HDL 37 (L) >13 mg/dL   VLDL Cholesterol Cal 29 5 - 40 mg/dL   LDL Calculated 086 (H)  0 - 99 mg/dL  HIV Antibody (routine testing w rflx)  Result Value Ref Range   HIV Screen 4th Generation wRfx Non Reactive Non Reactive      Assessment & Plan:   Problem List Items Addressed This Visit      Endocrine   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 Continue current regimen. Continue to monitor. Call with any concerns. Due to recheck A1c in 2 months.       Relevant Orders   Comprehensive metabolic panel   Hyperlipidemia associated with type 2 diabetes mellitus (HCC) - Primary    Rechecking levels today. Tolerating atorvastatin well. Call with any concerns.       Relevant Orders   Comprehensive metabolic panel   Lipid Panel w/o Chol/HDL Ratio     Musculoskeletal and Integument   Psoriasis    Not well controlled on his buttocks. Does not want to see dermatology right now. Will call if he changes his mind.       Arthritis of hip    Not under good control. Not feeling well. Will get him back into see orthopedics- would like to see [facility] for ease of transportation. Referral generated today, he will see them on Wednesday. Naproxen given. Continue gabapentin. Will avoid flexeril as it wasn't really working and patient was taking too much of it. Await input from ortho.      Relevant Medications   naproxen (NAPROSYN) 500 MG tablet   Other Relevant Orders    Ambulatory referral to Orthopedic Surgery     Other   Bipolar 2 disorder [facility])    Not doing well. Will continue seroquel for now and then stop when on latuda. Start latuda. Recheck 1 month. Call with any concerns.           Follow up plan: Return in about 4 weeks (around 04/05/2018).

## 2018-03-09 ENCOUNTER — Encounter: Payer: Self-pay | Admitting: Family Medicine

## 2018-03-09 LAB — COMPREHENSIVE METABOLIC PANEL
ALT: 110 IU/L — AB (ref 0–44)
AST: 95 IU/L — AB (ref 0–40)
Albumin/Globulin Ratio: 1.8 (ref 1.2–2.2)
Albumin: 4.7 g/dL (ref 3.6–4.8)
Alkaline Phosphatase: 91 IU/L (ref 39–117)
BILIRUBIN TOTAL: 0.8 mg/dL (ref 0.0–1.2)
BUN/Creatinine Ratio: 20 (ref 10–24)
BUN: 22 mg/dL (ref 8–27)
CHLORIDE: 101 mmol/L (ref 96–106)
CO2: 20 mmol/L (ref 20–29)
Calcium: 9.6 mg/dL (ref 8.6–10.2)
Creatinine, Ser: 1.11 mg/dL (ref 0.76–1.27)
GFR, EST AFRICAN AMERICAN: 80 mL/min/{1.73_m2} (ref >59)
GFR, EST NON AFRICAN AMERICAN: 69 mL/min/{1.73_m2} (ref >59)
GLOBULIN, TOTAL: 2.6 g/dL (ref 1.5–4.5)
Glucose: 141 mg/dL — ABNORMAL HIGH (ref 65–99)
POTASSIUM: 4.9 mmol/L (ref 3.5–5.2)
SODIUM: 139 mmol/L (ref 134–144)
Total Protein: 7.3 g/dL (ref 6.0–8.5)

## 2018-03-09 LAB — LIPID PANEL W/O CHOL/HDL RATIO
Cholesterol, Total: 149 mg/dL (ref 100–199)
HDL: 38 mg/dL — AB (ref >39)
LDL Calculated: 74 mg/dL (ref 0–99)
Triglycerides: 185 mg/dL — ABNORMAL HIGH (ref 0–149)
VLDL Cholesterol Cal: 37 mg/dL (ref 5–40)

## 2018-03-10 ENCOUNTER — Ambulatory Visit: Payer: Self-pay

## 2018-03-10 ENCOUNTER — Telehealth: Payer: Self-pay | Admitting: Family Medicine

## 2018-03-10 NOTE — Telephone Encounter (Signed)
Agree with calling 911 for welfare check

## 2018-03-10 NOTE — Telephone Encounter (Signed)
Copied from CRM [id number]. Topic: Quick Communication - See Telephone Encounter >> Mar 10, 2018 10:45 AM Arlyss Gandyichardson, Jaykob Minichiello N, NT wrote: CRM for notification. See Telephone encounter for: 03/10/18. Pt called in to let Dr. Laural BenesJohnson know that he missed his appt at the hospital today for a "triangle" procedure of his back. Pt states they could r/s him for tomorrow but he has his lung cancer screening test and could not make that appt. Pt states they told him the next available was on Monday. He asked my advice and I advised pt that he should try to get the appt r/s for Monday with the hospital so that he can have it on the the books. Pt stated he is having a lot of back pain and that he has "f'd his life up." He then states " I might as well go get a gun and end it all." I told him that he cannot say things like that without me having to take that concern seriously and I will need to get a triage nurse on the line with him. Patient then said "Don't send someone out here, don't send them here or it will be a real shoot out." He stated that he is just in a lot of pain and that he don't know how to help the pain, that he could just "go out on the street and buy the pills and take them." He stated that I should have a sense of humor, and where was my sense of humor. I explained to the pt that talking about shooting himself or harming himself was not a joke or something to laugh about. I told him this had to be taken very seriously and that I can get a nurse on the line with him for his safety. Pt agreed and the call was transferred to NT, nurse Sarah.

## 2018-03-10 NOTE — Telephone Encounter (Signed)
Prior to having call transferred to NT, Taren, an agent, stated that the pt missed an appointment for a "triangle" procedure at the hospital. Per Taren the pt wanted to know what he should do. The agent told him he needs to reschedule at the hospital to have procedure done. Taren stated that the pt stated  he was in a lot of back pain and he didn't know what to do. Taren stated he said, "I might as well get a gun and end it all."  Taren stated that she told the pt that he cannot say things like that and she stated she needed him to talk with a nurse. The patient stated "dont send anyone out here or it will be a real shoot out." According to Taren she told him that she was going to let him speak with someone for his safety. The pt then said that his pain is so bad that he will just go on the streets and buy pills. The pt told  Taren that she was supposed to have a sense of humor. Taren stated to pt that talking about shooting or harming yourself is not funny or something to joke about and I have to take it seriously. Call was transferred to me. Pt stated that the girl he just talked to didn't have a sense of humor. Pt went on talking about his back pain and that he was angry that he missed the appointment. NT informed the patient that we take comments like the ones he made to the agent very seriously and that I was calling 911 now, pt stated "please don't call them, they have come out before because they took me in their car and put me in the hospital." Attempted to find out more about his pain, but pt was unable to answer questions directly. Pt stated he was having mid back pain above his left hip,"but is on the inside." Pt stated he needed something for pain. Pt kept saying "I don't know what to do" and was pleading NT not to call 911.  Was not able to complete assessment questions due to pt's mental state. Pt had slurred speech and I asked him if he had been drinking and he denied drinking.  After pleading not  to call 911, informed pt that 911 has been called and he hung up. Called pt back and did not get an answer. Routing back to provider high priority. Answer Assessment - Initial Assessment Questions 1. ONSET: "When did the pain begin?"      1 year 2. LOCATION: "Where does it hurt?" (upper, mid or lower back)     Middle back above left hip but is on the inside- dull 3. SEVERITY: "How bad is the pain?"  (e.g., Scale 1-10; mild, moderate, or severe)   - MILD (1-3): doesn't interfere with normal activities    - MODERATE (4-7): interferes with normal activities or awakens from sleep    - SEVERE (8-10): excruciating pain, unable to do any normal activities      Moderate hurts worse at night 4. PATTERN: "Is the pain constant?" (e.g., yes, no; constant, intermittent)      *No Answer* 5. RADIATION: "Does the pain shoot into your legs or elsewhere?"     *No Answer* 6. CAUSE:  "What do you think is causing the back pain?"      *No Answer* 7. BACK OVERUSE:  "Any recent lifting of heavy objects, strenuous work or exercise?"     *No Answer*  8. MEDICATIONS: "What have you taken so far for the pain?" (e.g., nothing, acetaminophen, NSAIDS)     *No Answer* 9. NEUROLOGIC SYMPTOMS: "Do you have any weakness, numbness, or problems with bowel/bladder control?"     *No Answer* 10. OTHER SYMPTOMS: "Do you have any other symptoms?" (e.g., fever, abdominal pain, burning with urination, blood in urine)       *No Answer* 11. PREGNANCY: "Is there any chance you are pregnant?" (e.g., yes, no; LMP)       *No Answer*  Protocols used: BACK PAIN-A-AH

## 2018-03-11 ENCOUNTER — Inpatient Hospital Stay: Payer: Medicare Other | Attending: Nurse Practitioner | Admitting: Oncology

## 2018-03-11 ENCOUNTER — Ambulatory Visit: Payer: Medicare Other | Attending: Nurse Practitioner

## 2018-03-11 ENCOUNTER — Encounter: Payer: Self-pay | Admitting: Oncology

## 2018-03-17 ENCOUNTER — Telehealth: Payer: Self-pay | Admitting: *Deleted

## 2018-03-17 NOTE — Telephone Encounter (Signed)
Noted. Thanks.

## 2018-03-17 NOTE — Telephone Encounter (Signed)
Contacted patient in attempt to reschedule lung screening scan that patient was unable to make appt to. Patient refuses lung screening at this time. Reports he is too busy. Reviewed purpose of lung screening and patient verbalizes understanding.

## 2018-03-23 ENCOUNTER — Ambulatory Visit: Payer: Self-pay | Admitting: *Deleted

## 2018-03-23 NOTE — Telephone Encounter (Signed)
Called and spoke with patient. He had multiple concerns. First issue was that pt was unsure what the Latuda was for. Explained this was for his bipolar and the help stabilize his mood. Patient then wanted to know that his triglycerides were, advised they are a type of fat found in your blood, and advised patient on ways to lower them. Weight loss, Decrease sugar intake, low-carb diet, exercised and avoiding trans fats. Patient then stated he may not keep his month f/u. Encouraged him to keep it in order for Dr. Laural BenesJohnson to provide best care and continue to follow his progress. Patient then stated that [facility] like [facility] to in the old days.

## 2018-03-23 NOTE — Telephone Encounter (Signed)
Pt calling with questions about triglycerides.

## 2018-03-24 NOTE — Telephone Encounter (Signed)
Noted. Thanks.

## 2018-03-30 ENCOUNTER — Telehealth: Payer: Self-pay | Admitting: *Deleted

## 2018-03-30 DIAGNOSIS — Z87891 Personal history of nicotine dependence: Secondary | ICD-10-CM

## 2018-03-30 DIAGNOSIS — Z122 Encounter for screening for malignant neoplasm of respiratory organs: Secondary | ICD-10-CM

## 2018-03-30 NOTE — Telephone Encounter (Signed)
Received referral for initial lung cancer screening scan. Contacted patient and obtained smoking history,(former, quit 2017, 40 pack year) as well as answering questions related to screening process. Patient denies signs of lung cancer such as weight loss or hemoptysis. Patient denies comorbidity that would prevent curative treatment if lung cancer were found. Patient is scheduled for shared decision making visit and CT scan on 04/13/18 at 2pm.

## 2018-04-12 ENCOUNTER — Encounter: Payer: Self-pay | Admitting: Family Medicine

## 2018-04-12 ENCOUNTER — Ambulatory Visit (INDEPENDENT_AMBULATORY_CARE_PROVIDER_SITE_OTHER): Payer: Medicare Other | Admitting: Family Medicine

## 2018-04-12 VITALS — BP 145/92 | HR 75 | Temp 97.9°F | Wt 212.8 lb

## 2018-04-12 DIAGNOSIS — F3181 Bipolar II disorder: Secondary | ICD-10-CM

## 2018-04-12 MED ORDER — QUETIAPINE FUMARATE 25 MG PO TABS: 25.0000 mg | ORAL_TABLET | Freq: Two times a day (BID) | ORAL | 3 refills | Status: DC | PRN

## 2018-04-12 NOTE — Patient Instructions (Signed)
[phone]-- Fed ex Pick up Number

## 2018-04-12 NOTE — Assessment & Plan Note (Signed)
Not manic today. He is interested in seeing psychiatry although he is nervous about this, and hesitant to take anything. Would be willing to go talk with them. Referral generated. Would be willing to take 1/2 his seroquel dose- will send through 25mg  that he can take BID for anxiety. Continue to monitor closely.

## 2018-04-12 NOTE — Progress Notes (Signed)
BP (!) 145/92 (BP Location: Left Arm, Cuff Size: Normal)   Pulse 75   Temp 97.9 F (36.6 C) (Oral)   Wt 212 lb 12.8 oz (96.5 kg)   SpO2 97%   BMI 32.36 kg/m    Subjective:    Patient ID: David Blackburn, male    DOB: 02/03/1951, 67 y.o.   MRN: [id number]  HPI: David Blackburn is a 67 y.o. male  Chief Complaint  Patient presents with  . Manic Behavior    pt states he is not taking the seroquel or latuda   BIPOLAR- has not been taking his seroquel or his latuda. He states that he is not taking it because it makes him feel antsy- he feels restless on it. He notes that taking the seroquel made him gain weight. His latuda gave him some restless leg, he was seeing Dr. Janeece RiggersSu in the past. Was driving with his friend and had a xanax from her and that helped Mood status: better Satisfied with current treatment?: no Symptom severity: moderate  Duration of current treatment : off his medicine Side effects: yes Medication compliance: poor compliance Psychotherapy/counseling: no  Previous psychiatric medications: seroquel- antsy, latuda- restless leg, zoloft Depressed mood: yes Anxious mood: yes Anhedonia: no Significant weight loss or gain: no Insomnia: yes hard to fall asleep Fatigue: yes Feelings of worthlessness or guilt: no Impaired concentration/indecisiveness: yes Suicidal ideations: no Hopelessness: no Crying spells: no [facility] screen [facility] 04/12/2018 03/08/2018 02/03/2018 08/31/2017  Decreased Interest 1 2 0 0  Down, Depressed, Hopeless 0 0 0 0  PHQ - 2 Score 1 2 0 0  Altered sleeping 1 2 3  -  Tired, decreased energy 0 1 0 -  Change in appetite 0 1 0 -  Feeling bad or failure about yourself  0 2 0 -  Trouble concentrating 1 1 0 -  Moving slowly or fidgety/restless 0 1 0 -  Suicidal thoughts 0 0 0 -  PHQ-9 Score 3 10 3  -  Difficult doing work/chores Not difficult at all Very difficult Not difficult at all -   Relevant past medical, surgical, family and social history  reviewed and updated as indicated. Interim medical history since our last visit reviewed. Allergies and medications reviewed and updated.  Review of Systems  Constitutional: Negative.   Respiratory: Negative.   Cardiovascular: Negative.   Skin: Negative.   Neurological: Negative.   Psychiatric/Behavioral: Positive for agitation, behavioral problems and dysphoric mood. Negative for confusion, decreased concentration, hallucinations, self-injury, sleep disturbance and suicidal ideas. The patient is nervous/anxious. The patient is not hyperactive.     Per HPI unless specifically indicated above     Objective:    BP (!) 145/92 (BP Location: Left Arm, Cuff Size: Normal)   Pulse 75   Temp 97.9 F (36.6 C) (Oral)   Wt 212 lb 12.8 oz (96.5 kg)   SpO2 97%   BMI 32.36 kg/m   Wt Readings from Last 3 Encounters:  04/12/18 212 lb 12.8 oz (96.5 kg)  03/08/18 214 lb 4.8 oz (97.2 kg)  02/03/18 209 lb (94.8 kg)    Physical Exam Vitals signs and nursing note reviewed.  Constitutional:      General: He is not in acute distress.    Appearance: Normal appearance. He is not ill-appearing, toxic-appearing or diaphoretic.  HENT:     Head: Normocephalic and atraumatic.     Right Ear: External ear normal.     Left Ear: External ear normal.  Nose: Nose normal.     Mouth/Throat:     Mouth: Mucous membranes are moist.     Pharynx: Oropharynx is clear.  Eyes:     General: No scleral icterus.       Right eye: No discharge.        Left eye: No discharge.     Extraocular Movements: Extraocular movements intact.     Conjunctiva/sclera: Conjunctivae normal.     Pupils: Pupils are equal, round, and reactive to light.  Neck:     Musculoskeletal: Normal range of motion and neck supple.  Cardiovascular:     Rate and Rhythm: Normal rate and regular rhythm.     Pulses: Normal pulses.     Heart sounds: Normal heart sounds. No murmur. No friction rub. No gallop.   Pulmonary:     Effort: Pulmonary  effort is normal. No respiratory distress.     Breath sounds: Normal breath sounds. No stridor. No wheezing, rhonchi or rales.  Chest:     Chest wall: No tenderness.  Musculoskeletal: Normal range of motion.  Skin:    General: Skin is warm and dry.     Capillary Refill: Capillary refill takes less than 2 seconds.     Coloration: Skin is not jaundiced or pale.     Findings: No bruising, erythema, lesion or rash.  Neurological:     General: No focal deficit present.     Mental Status: He is alert and oriented to person, place, and time. Mental status is at baseline.  Psychiatric:        Mood and Affect: Mood normal.        Behavior: Behavior normal.        Thought Content: Thought content normal.        Judgment: Judgment normal.     Results for orders placed or performed in visit on 03/08/18  Comprehensive metabolic panel  Result Value Ref Range   Glucose 141 (H) 65 - 99 mg/dL   BUN 22 8 - 27 mg/dL   Creatinine, Ser 1.61 0.76 - 1.27 mg/dL   GFR calc non Af Amer 69 >59 mL/min/1.73   GFR calc Af Amer 80 >59 mL/min/1.73   BUN/Creatinine Ratio 20 10 - 24   Sodium 139 134 - 144 mmol/L   Potassium 4.9 3.5 - 5.2 mmol/L   Chloride 101 96 - 106 mmol/L   CO2 20 20 - 29 mmol/L   Calcium 9.6 8.6 - 10.2 mg/dL   Total Protein 7.3 6.0 - 8.5 g/dL   Albumin 4.7 3.6 - 4.8 g/dL   Globulin, Total 2.6 1.5 - 4.5 g/dL   Albumin/Globulin Ratio 1.8 1.2 - 2.2   Bilirubin Total 0.8 0.0 - 1.2 mg/dL   Alkaline Phosphatase 91 39 - 117 IU/L   AST 95 (H) 0 - 40 IU/L   ALT 110 (H) 0 - 44 IU/L  Lipid Panel w/o Chol/HDL Ratio  Result Value Ref Range   Cholesterol, Total 149 100 - 199 mg/dL   Triglycerides 096 (H) 0 - 149 mg/dL   HDL 38 (L) >04 mg/dL   VLDL Cholesterol Cal 37 5 - 40 mg/dL   LDL Calculated 74 0 - 99 mg/dL      Assessment & Plan:   Problem List Items Addressed This Visit      Other   Bipolar 2 disorder (HCC) - Primary    Not manic today. He is interested in seeing psychiatry  although he is nervous about this, and hesitant  to take anything. Would be willing to go talk with them. Referral generated. Would be willing to take 1/2 his seroquel dose- will send through 25mg  that he can take BID for anxiety. Continue to monitor closely.      Relevant Orders   Ambulatory referral to Psychiatry       Follow up plan: Return in about 4 weeks (around 05/10/2018) for Follow up DM.

## 2018-04-13 ENCOUNTER — Ambulatory Visit
Admission: RE | Admit: 2018-04-13 | Discharge: 2018-04-13 | Disposition: A | Discharge status: Home / Self Care | Payer: Medicare Other | Source: Ambulatory Visit | Attending: Oncology | Admitting: Oncology

## 2018-04-13 ENCOUNTER — Encounter: Payer: Self-pay | Admitting: Nurse Practitioner

## 2018-04-13 ENCOUNTER — Inpatient Hospital Stay: Payer: Medicare Other | Attending: Oncology | Admitting: Nurse Practitioner

## 2018-04-13 DIAGNOSIS — Z87891 Personal history of nicotine dependence: Secondary | ICD-10-CM

## 2018-04-13 DIAGNOSIS — Z122 Encounter for screening for malignant neoplasm of respiratory organs: Secondary | ICD-10-CM | POA: Insufficient documentation

## 2018-04-13 NOTE — Progress Notes (Signed)
In accordance with CMS guidelines, patient has met eligibility criteria including age, absence of signs or symptoms of lung cancer.  Social History   Tobacco Use  . Smoking status: Former Smoker    Packs/day: 1.00    Years: 40.00    Pack years: 40.00    Types: Cigars    Last attempt to quit: 2017    Years since quitting: 2.9  . Smokeless tobacco: Former User  . Tobacco comment: occassionally a cigar  Substance Use Topics  . Alcohol use: Yes    Alcohol/week: 21.0 standard drinks    Types: 21 Glasses of wine per week  . Drug use: Not Currently      A shared decision-making session was conducted prior to the performance of CT scan. This includes one or more decision aids, includes benefits and harms of screening, follow-up diagnostic testing, over-diagnosis, false positive rate, and total radiation exposure.   Counseling on the importance of adherence to annual lung cancer LDCT screening, impact of co-morbidities, and ability or willingness to undergo diagnosis and treatment is imperative for compliance of the program.   Counseling on the importance of continued smoking cessation for former smokers; the importance of smoking cessation for current smokers, and information about tobacco cessation interventions have been given to patient including [place] [facility]uit [facility] and 1800 quit [place] programs.   Written order for lung cancer screening with LDCT has been given to the patient and any and all questions have been answered to the best of my abilities.    Yearly follow up will be coordinated by Shawn Perkins, Thoracic Navigator.   , DNP, AGNP-C [facility] [phone] (work cell) [phone] (office) 04/13/18 4:07 PM   

## 2018-04-14 ENCOUNTER — Telehealth: Payer: Self-pay | Admitting: *Deleted

## 2018-04-14 ENCOUNTER — Encounter: Payer: Self-pay | Admitting: *Deleted

## 2018-04-14 NOTE — Telephone Encounter (Signed)
Attempted to contact patient to discuss LDCT lung cancer screening results.  Unable to reach patient at this time.  Left Message for them to return call to [phone] to either myself or Shawn Perkins RN to discuss the results.    

## 2018-04-19 ENCOUNTER — Telehealth: Payer: Self-pay | Admitting: *Deleted

## 2018-04-19 NOTE — Telephone Encounter (Signed)
   Attempted to contact patient to discuss LDCT lung cancer screening results.  Unable to reach patient at this time.  Unable to leave message at this time, will call again at later point.    

## 2018-04-24 ENCOUNTER — Other Ambulatory Visit: Payer: Self-pay | Admitting: Family Medicine

## 2018-04-26 NOTE — Telephone Encounter (Signed)
Requested medication (s) are due for refill today: yes  Requested medication (s) are on the active medication list: yes  Last refill:  02/03/18 for 30 pills and 1 refill  Future visit scheduled: yes  Notes to clinic:  Med can not be delegated.  Requested Prescriptions  Pending Prescriptions Disp Refills   cyclobenzaprine (FLEXERIL) 10 MG tablet [Pharmacy Med Name: CYCLOBENZAPRINE HCL 10 MG TAB] 30 tablet 1    Sig: TAKE 1 TABLET BY MOUTH AT BEDTIME     Not Delegated - Analgesics:  Muscle Relaxants Failed - 04/24/2018 11:31 AM      Failed - This refill cannot be delegated      Passed - Valid encounter within last 6 months    Recent Outpatient Visits          2 weeks ago Bipolar 2 disorder [facility](HCC)   [facility] Johnson, Megan P, DO   1 month ago Hyperlipidemia associated with type 2 diabetes mellitus (HCC)   [facility] [place], [place], [place]   2 months ago [facility] [place], [place], [place]   7 months ago Essential hypertension   [facility] [place]      Future Appointments            In 2 weeks Laural BenesJohnson, Oralia RudMegan P, DO [facility]

## 2018-05-03 ENCOUNTER — Ambulatory Visit: Payer: Self-pay | Admitting: Family Medicine

## 2018-05-03 NOTE — Telephone Encounter (Addendum)
Patient has questions about how many carbs should a diabetic patient eat / Left message to call back if he was still interested in the information. / Patient returned my call.  I advised that he should have 45-60 grams of carbs per meal, 15-30 grams of carbs for snacks / I did advised that he every patient is different, and that these are just average figures / Patient then wanted to discuss his living will, and potential for being a DNR. / I advised patient to discuss this with Dr. Laural Benes at his next visit. / Patient agrees and understands.

## 2018-05-13 ENCOUNTER — Encounter: Payer: Self-pay | Admitting: Family Medicine

## 2018-05-13 ENCOUNTER — Ambulatory Visit (INDEPENDENT_AMBULATORY_CARE_PROVIDER_SITE_OTHER): Payer: Medicare Other | Admitting: Family Medicine

## 2018-05-13 ENCOUNTER — Other Ambulatory Visit: Payer: Self-pay | Admitting: Family Medicine

## 2018-05-13 VITALS — BP 132/80 | HR 76 | Temp 97.7°F | Ht 68.0 in | Wt 208.0 lb

## 2018-05-13 DIAGNOSIS — E1129 Type 2 diabetes mellitus with other diabetic kidney complication: Secondary | ICD-10-CM

## 2018-05-13 DIAGNOSIS — I10 Essential (primary) hypertension: Secondary | ICD-10-CM

## 2018-05-13 DIAGNOSIS — L409 Psoriasis, unspecified: Secondary | ICD-10-CM

## 2018-05-13 DIAGNOSIS — Z1211 Encounter for screening for malignant neoplasm of colon: Secondary | ICD-10-CM

## 2018-05-13 DIAGNOSIS — E1165 Type 2 diabetes mellitus with hyperglycemia: Secondary | ICD-10-CM | POA: Diagnosis not present

## 2018-05-13 DIAGNOSIS — F3181 Bipolar II disorder: Secondary | ICD-10-CM | POA: Diagnosis not present

## 2018-05-13 MED ORDER — QUETIAPINE FUMARATE ER 50 MG PO TB24: 50.0000 mg | ORAL_TABLET | Freq: Every day | ORAL | 1 refills | Status: DC

## 2018-05-13 MED ORDER — GABAPENTIN 400 MG PO CAPS: 400.0000 mg | ORAL_CAPSULE | Freq: Three times a day (TID) | ORAL | 1 refills | Status: DC

## 2018-05-13 MED ORDER — LOSARTAN POTASSIUM 50 MG PO TABS: 50.0000 mg | ORAL_TABLET | Freq: Every day | ORAL | 1 refills | Status: DC

## 2018-05-13 MED ORDER — METFORMIN HCL ER 500 MG PO TB24: 500.0000 mg | ORAL_TABLET | Freq: Every day | ORAL | 1 refills | Status: DC

## 2018-05-13 MED ORDER — ATORVASTATIN CALCIUM 40 MG PO TABS: 40.0000 mg | ORAL_TABLET | Freq: Every day | ORAL | 1 refills | Status: DC

## 2018-05-13 NOTE — Assessment & Plan Note (Signed)
Stable. Will consider referral to psychiatry. Call with any concerns.

## 2018-05-13 NOTE — Telephone Encounter (Signed)
Per Marchelle Folks at [facility]. Pt Rx for Gabapentin has been received and she will notify pt when ready.

## 2018-05-13 NOTE — Progress Notes (Signed)
BP 132/80 (BP Location: Left Arm, Patient Position: Sitting, Cuff Size: Normal)   Pulse 76   Temp 97.7 F (36.5 C) (Oral)   Ht 5\' 8"  (1.727 m)   Wt 208 lb (94.3 kg)   SpO2 95%   BMI 31.63 kg/m    Subjective:    Patient ID: David Blackburn, male    DOB: 03/01/1951, 68 y.o.   MRN: [id number]  HPI: David Phenixshley Hudlow Blackburn is a 68 y.o. male  Chief Complaint  Patient presents with  . Diabetes  . Medication Management    Patient would like to know if Niacin Flush is okay to take.   . Living Will    Would like to discuss Living Will.    DIABETES Hypoglycemic episodes:no Polydipsia/polyuria: no Visual disturbance: no Chest pain: no Paresthesias: no Glucose Monitoring: no  Accucheck frequency: Not Checking Taking Insulin?: no Blood Pressure Monitoring: not checking Retinal Examination: Up to Date Foot Exam: Up to Date Diabetic Education: Completed Pneumovax: Up to Date Influenza: Up to Date Aspirin: yes   Would like to start taking niacin. Would like to get back into see dermatology.  Does not want to see psychiatry right now, will consider it in the future. He is doing better on the seroquel. Feeling well with it. No other concerns or complaints at this time.   Relevant past medical, surgical, family and social history reviewed and updated as indicated. Interim medical history since our last visit reviewed. Allergies and medications reviewed and updated.  Review of Systems  Constitutional: Negative.   Respiratory: Negative.   Cardiovascular: Negative.   Musculoskeletal: Negative.   Skin: Negative.   Psychiatric/Behavioral: Positive for dysphoric mood. Negative for agitation, behavioral problems, confusion, decreased concentration, hallucinations, self-injury, sleep disturbance and suicidal ideas. The patient is nervous/anxious. The patient is not hyperactive.     Per HPI unless specifically indicated above     Objective:    BP 132/80 (BP Location: Left Arm, Patient  Position: Sitting, Cuff Size: Normal)   Pulse 76   Temp 97.7 F (36.5 C) (Oral)   Ht 5\' 8"  (1.727 m)   Wt 208 lb (94.3 kg)   SpO2 95%   BMI 31.63 kg/m   Wt Readings from Last 3 Encounters:  05/13/18 208 lb (94.3 kg)  04/13/18 212 lb (96.2 kg)  04/12/18 212 lb 12.8 oz (96.5 kg)    Physical Exam Vitals signs and nursing note reviewed.  Constitutional:      General: He is not in acute distress.    Appearance: Normal appearance. He is not ill-appearing, toxic-appearing or diaphoretic.  HENT:     Head: Normocephalic and atraumatic.     Right Ear: External ear normal.     Left Ear: External ear normal.     Nose: Nose normal.     Mouth/Throat:     Mouth: Mucous membranes are moist.     Pharynx: Oropharynx is clear.  Eyes:     General: No scleral icterus.       Right eye: No discharge.        Left eye: No discharge.     Extraocular Movements: Extraocular movements intact.     Conjunctiva/sclera: Conjunctivae normal.     Pupils: Pupils are equal, round, and reactive to light.  Neck:     Musculoskeletal: Normal range of motion and neck supple.  Cardiovascular:     Rate and Rhythm: Normal rate and regular rhythm.     Pulses: Normal pulses.  Heart sounds: Normal heart sounds. No murmur. No friction rub. No gallop.   Pulmonary:     Effort: Pulmonary effort is normal. No respiratory distress.     Breath sounds: Normal breath sounds. No stridor. No wheezing, rhonchi or rales.  Chest:     Chest wall: No tenderness.  Musculoskeletal: Normal range of motion.  Skin:    General: Skin is warm and dry.     Capillary Refill: Capillary refill takes less than 2 seconds.     Coloration: Skin is not jaundiced or pale.     Findings: No bruising, erythema, lesion or rash.  Neurological:     General: No focal deficit present.     Mental Status: He is alert and oriented to person, place, and time. Mental status is at baseline.  Psychiatric:        Mood and Affect: Mood normal.         Behavior: Behavior normal.        Thought Content: Thought content normal.        Judgment: Judgment normal.     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 Continue current regimen. Continue to monitor. Will consider lifestyle center. If he would like it in the future, will refer him. Continue to monitor.       Relevant Medications   metFORMIN (GLUCOPHAGE-XR) 500 MG 24 hr tablet   losartan (COZAAR) 50 MG tablet   atorvastatin (LIPITOR) 40 MG tablet   Other Relevant Orders   Bayer DCA Hb A1c Waived (Completed)     Musculoskeletal and Integument   Psoriasis    Would like to get back in with [facility]. New referral generated today.      Relevant Orders   Ambulatory referral to Dermatology     Other   Bipolar 2 disorder (HCC)    Stable. Will consider referral to psychiatry. Call with any concerns.        Other Visit Diagnoses    Screening for colon cancer       Did NOT do cologuard- would like to do colonoscopy. Call with any concerns.    Relevant Orders   Ambulatory referral to Gastroenterology   Hypertension, unspecified type       Relevant Medications   Inositol Niacinate (NIACIN FLUSH FREE PO)   losartan (COZAAR) 50 MG tablet   gabapentin (NEURONTIN) 400 MG capsule   atorvastatin (LIPITOR) 40 MG tablet       Follow up plan: Return in about 3 months (around 08/12/2018).

## 2018-05-13 NOTE — Patient Instructions (Signed)
Carbohydrate Counting for Diabetes Mellitus, Adult  Carbohydrate counting is a method of keeping track of how many carbohydrates you eat. Eating carbohydrates naturally increases the amount of sugar (glucose) in the blood. Counting how many carbohydrates you eat helps keep your blood glucose within normal limits, which helps you manage your diabetes (diabetes mellitus). It is important to know how many carbohydrates you can safely have in each meal. This is different for every person. A diet and nutrition specialist (registered dietitian) can help you make a meal plan and calculate how many carbohydrates you should have at each meal and snack. Carbohydrates are found in the following foods:  Grains, such as breads and cereals.  Dried beans and soy products.  Starchy vegetables, such as potatoes, peas, and corn.  Fruit and fruit juices.  Milk and yogurt.  Sweets and snack foods, such as cake, cookies, candy, chips, and soft drinks. How do I count carbohydrates? There are two ways to count carbohydrates in food. You can use either of the methods or a combination of both. Reading "Nutrition Facts" on packaged food The "Nutrition Facts" list is included on the labels of almost all packaged foods and beverages in the [place] It includes:  The serving size.  Information about nutrients in each serving, including the grams (g) of carbohydrate per serving. To use the "Nutrition Facts":  Decide how many servings you will have.  Multiply the number of servings by the number of carbohydrates per serving.  The resulting number is the total amount of carbohydrates that you will be having. Learning standard serving sizes of other foods When you eat carbohydrate foods that are not packaged or do not include "Nutrition Facts" on the label, you need to measure the servings in order to count the amount of carbohydrates:  Measure the foods that you will eat with a food scale or measuring cup, if needed.   Decide how many standard-size servings you will eat.  Multiply the number of servings by 15. Most carbohydrate-rich foods have about 15 g of carbohydrates per serving. ? For example, if you eat 8 oz (170 g) of strawberries, you will have eaten 2 servings and 30 g of carbohydrates (2 servings x 15 g = 30 g).  For foods that have more than one food mixed, such as soups and casseroles, you must count the carbohydrates in each food that is included. The following list contains standard serving sizes of common carbohydrate-rich foods. Each of these servings has about 15 g of carbohydrates:   hamburger bun or  English muffin.   oz (15 mL) syrup.   oz (14 g) jelly.  1 slice of bread.  1 six-inch tortilla.  3 oz (85 g) cooked rice or pasta.  4 oz (113 g) cooked dried beans.  4 oz (113 g) starchy vegetable, such as peas, corn, or potatoes.  4 oz (113 g) hot cereal.  4 oz (113 g) mashed potatoes or  of a large baked potato.  4 oz (113 g) canned or frozen fruit.  4 oz (120 mL) fruit juice.  4-6 crackers.  6 chicken nuggets.  6 oz (170 g) unsweetened dry cereal.  6 oz (170 g) plain fat-free yogurt or yogurt sweetened with artificial sweeteners.  8 oz (240 mL) milk.  8 oz (170 g) fresh fruit or one small piece of fruit.  24 oz (680 g) popped popcorn. Example of carbohydrate counting Sample meal  3 oz (85 g) chicken breast.  6 oz (170 g)   brown rice.  4 oz (113 g) corn.  8 oz (240 mL) milk.  8 oz (170 g) strawberries with sugar-free whipped topping. Carbohydrate calculation 1. Identify the foods that contain carbohydrates: ? Rice. ? Corn. ? Milk. ? Strawberries. 2. Calculate how many servings you have of each food: ? 2 servings rice. ? 1 serving corn. ? 1 serving milk. ? 1 serving strawberries. 3. Multiply each number of servings by 15 g: ? 2 servings rice x 15 g = 30 g. ? 1 serving corn x 15 g = 15 g. ? 1 serving milk x 15 g = 15 g. ? 1 serving  strawberries x 15 g = 15 g. 4. Add together all of the amounts to find the total grams of carbohydrates eaten: ? 30 g + 15 g + 15 g + 15 g = 75 g of carbohydrates total. Summary  Carbohydrate counting is a method of keeping track of how many carbohydrates you eat.  Eating carbohydrates naturally increases the amount of sugar (glucose) in the blood.  Counting how many carbohydrates you eat helps keep your blood glucose within normal limits, which helps you manage your diabetes.  A diet and nutrition specialist (registered dietitian) can help you make a meal plan and calculate how many carbohydrates you should have at each meal and snack. This information is not intended to replace advice given to you by your health care provider. Make sure you discuss any questions you have with your health care provider. Document Released: 04/14/2005 Document Revised: 10/22/2016 Document Reviewed: 09/26/2015 Elsevier Interactive Patient Education  2019 Elsevier Inc.  

## 2018-05-13 NOTE — Assessment & Plan Note (Addendum)
Improved with A1c of 6.3. Continue current regimen. Continue to monitor. Will consider lifestyle center. If he would like it in the future, will refer him. Continue to monitor.

## 2018-05-13 NOTE — Assessment & Plan Note (Signed)
Would like to get back in with [facility]. New referral generated today.

## 2018-05-14 ENCOUNTER — Telehealth: Payer: Self-pay | Admitting: Gastroenterology

## 2018-05-14 ENCOUNTER — Telehealth: Payer: Self-pay

## 2018-05-14 ENCOUNTER — Other Ambulatory Visit: Payer: Self-pay

## 2018-05-14 DIAGNOSIS — Z1211 Encounter for screening for malignant neoplasm of colon: Secondary | ICD-10-CM

## 2018-05-14 LAB — BAYER DCA HB A1C WAIVED: HB A1C (BAYER DCA - WAIVED): 6.3 % (ref <7.0)

## 2018-05-14 MED ORDER — NA SULFATE-K SULFATE-MG SULF 17.5-3.13-1.6 GM/177ML PO SOLN: 1.0000 | Freq: Once | ORAL | 0 refills | Status: AC

## 2018-05-14 NOTE — Telephone Encounter (Signed)
Patient called and wants to cancel his colonoscopy for 05-24-2018 with Dr Tobi Bastos has no one to bring him. He would like Marcelino Duster to call him back & confirmed she cancelled the appointment

## 2018-05-14 NOTE — Telephone Encounter (Signed)
PT  Is calling to schedule a colonoscopy he is returning Michelle's call

## 2018-05-14 NOTE — Telephone Encounter (Signed)
Returned patients call to cancel his colonoscopy.  I explained to patient that I understand he has to cancel due to transportation reasons, and I would be happy to schedule him anytime this year when he has friends or family in town that can accompany him to his procedure.  He has canceled his rx bowel prep with pharmacy.I've canceled his procedure with Tr[facility] in Endoscopy.  Colonoscopy referral note created and referral has been canceled.  Thanks [employer]

## 2018-05-16 ENCOUNTER — Encounter: Payer: Self-pay | Admitting: Family Medicine

## 2018-05-21 ENCOUNTER — Other Ambulatory Visit: Payer: Self-pay | Admitting: Family Medicine

## 2018-05-21 NOTE — Telephone Encounter (Signed)
Requested medication (s) are due for refill today: no  Requested medication (s) are on the active medication list: yes  Last refill:  04/26/18 #30 1 RF  Future visit scheduled: yes  Notes to clinic:  The medication not delegated to NT to refill    Requested Prescriptions  Pending Prescriptions Disp Refills   cyclobenzaprine (FLEXERIL) 10 MG tablet [Pharmacy Med Name: CYCLOBENZAPRINE HCL 10 MG TAB] 30 tablet 1    Sig: TAKE 1 TABLET BY MOUTH AT BEDTIME     Not Delegated - Analgesics:  Muscle Relaxants Failed - 05/21/2018 10:02 AM      Failed - This refill cannot be delegated      Passed - Valid encounter within last 6 months    Recent Outpatient Visits          1 week ago 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, [place], DO   8 months ago Essential hypertension   [facility] [place], [place], DO      Future Appointments            In 2 months Laural Benes, Oralia Rud, DO [facility], [facility]

## 2018-05-24 ENCOUNTER — Ambulatory Visit: Admit: 2018-05-24 | Payer: Medicare Other | Admitting: Gastroenterology

## 2018-05-24 SURGERY — COLONOSCOPY WITH PROPOFOL
Anesthesia: General

## 2018-06-19 ENCOUNTER — Other Ambulatory Visit: Payer: Self-pay | Admitting: Family Medicine

## 2018-06-21 NOTE — Telephone Encounter (Signed)
Patient called, left VM to return call to the office in reference to the refill request received. David Blackburn was discontinued by provider on 04/12/18 OV noted patient no taking.

## 2018-06-21 NOTE — Telephone Encounter (Signed)
Requested medication (s) are due for refill today: Yes  Requested medication (s) are on the active medication list: Yes to Cyclobenzaprine; No to Latuda  Last refill:  04/26/18 for Flexeril  Future visit scheduled: Yes  Notes to clinic:  Unable to refill, cannot delegate     Requested Prescriptions  Pending Prescriptions Disp Refills   LATUDA 80 MG TABS tablet [Pharmacy Med Name: LATUDA 80 MG TAB] 30 tablet 2    Sig: TAKE 1 TABLET BY MOUTH ONCE DAILY WITH BREAKFAST     Not Delegated - Psychiatry:  Antipsychotics - Second Generation (Atypical) - lurasidone Failed - 06/21/2018 11:05 AM      Failed - This refill cannot be delegated      Passed - Last BP in normal range    BP Readings from Last 1 Encounters:  05/13/18 132/80         Passed - Last Heart Rate in normal range    Pulse Readings from Last 1 Encounters:  05/13/18 76         Passed - Valid encounter within last 6 months    Recent Outpatient Visits          1 month ago 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, [place], DO   9 months ago Essential hypertension   [facility] [place], [place], DO      Future Appointments            In 1 month Johnson, Megan P, DO [facility], [facility]          cyclobenzaprine (FLEXERIL) 10 MG tablet [employer] Med Name: CYCLOBENZAPRINE HCL 10 MG TAB] 30 tablet 1    Sig: TAKE 1 TABLET BY MOUTH AT BEDTIME     Not Delegated - Analgesics:  Muscle Relaxants Failed - 06/21/2018 11:05 AM      Failed - This refill cannot be delegated      Passed - Valid encounter within last 6 months   Recent Outpatient Visits          1 month ago 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, [place], DO   9 months ago Essential hypertension   [facility] [place], [place], DO      Future Appointments            In 1 month Johnson, Oralia Rud, DO [facility], [facility]

## 2018-06-23 ENCOUNTER — Other Ambulatory Visit: Payer: Self-pay | Admitting: Family Medicine

## 2018-06-23 NOTE — Telephone Encounter (Signed)
Requested medication (s) are due for refill today -yes  Requested medication (s) are on the active medication list -yes   Future visit scheduled -yes  Last refill: 04/26/18 1RF  Notes to clinic: Patient requesting refill of non delegated Rx- sent for provider review  Requested Prescriptions  Pending Prescriptions Disp Refills   cyclobenzaprine (FLEXERIL) 10 MG tablet [Pharmacy Med Name: CYCLOBENZAPRINE HCL 10 MG TAB] 30 tablet 1    Sig: TAKE 1 TABLET BY MOUTH AT BEDTIME     Not Delegated - Analgesics:  Muscle Relaxants Failed - 06/23/2018  2:04 PM      Failed - This refill cannot be delegated      Passed - Valid encounter within last 6 months    Recent Outpatient Visits          1 month ago 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, [place], DO   9 months ago Essential hypertension   [facility] [place], [place], DO      Future Appointments            In 1 month Johnson, Megan P, DO [facility], [facility]            Requested Prescriptions  Pending Prescriptions Disp Refills   cyclobenzaprine (FLEXERIL) 10 MG tablet [Pharmacy Med Name: CYCLOBENZAPRINE HCL 10 MG TAB] 30 tablet 1    Sig: TAKE 1 TABLET BY MOUTH AT BEDTIME     Not Delegated - Analgesics:  Muscle Relaxants Failed - 06/23/2018  2:04 PM      Failed - This refill cannot be delegated      Passed - Valid encounter within last 6 months    Recent Outpatient Visits          1 month ago 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, [place], DO   9 months ago Essential hypertension   [facility] [place], [place], DO      Future Appointments            In 1 month Johnson, Oralia Rud, DO [facility], [facility]

## 2018-06-25 ENCOUNTER — Telehealth: Payer: Self-pay | Admitting: Family Medicine

## 2018-06-25 NOTE — Telephone Encounter (Signed)
I don't know what that means. Please schedule him an appointment to discuss medication. I would not advise him to stop his latuda

## 2018-06-25 NOTE — Telephone Encounter (Signed)
Copied from CRM [id number]. Topic: Quick Communication - See Telephone Encounter >> Jun 25, 2018  9:10 AM Jens Som A wrote: CRM for notification. See Telephone encounter for: 06/25/18.  Patient is calling because he is interested in discontinuing the LATUDA 80 MG TABS tablet [[id number]] It makes him feel like he is having a running leg. Please advise [phone]

## 2018-06-28 NOTE — Telephone Encounter (Signed)
Attempted to reach patient. Someone answered and d/c the line. Attempted to call back w/o success

## 2018-06-29 NOTE — Telephone Encounter (Signed)
Left message on machine for pt to return call to the office.  

## 2018-06-29 NOTE — Telephone Encounter (Signed)
Pt is returning [place] call

## 2018-06-30 NOTE — Telephone Encounter (Signed)
Called and spoke to patient. Dr. Henriette Combs message relayed. Patient states that he is not taking Latuda at this time. Pt stated that he stopped taking it a while ago as he thinks he does not need to be taking the latuda. Pt did not want to schedule an OV at this time to discuss medication. Pt stated he has an upcoming appointment on 08/12/18 and he wants to wait until that day. I asked patient to give [place] a call to the the office to schedule an appt sooner than that if for any reason he changes his mind. Patient verbalized understanding.

## 2018-07-19 ENCOUNTER — Telehealth: Payer: Self-pay | Admitting: Family Medicine

## 2018-07-19 NOTE — Telephone Encounter (Signed)
Copied from CRM [id number]. Topic: General - Other >> Jul 19, 2018  2:40 PM Leafy Ro wrote: Reason for CRM: pt left voice message on refill line. Pt is calling and would like seroquel 50 mg to be increase . [facility] [place]

## 2018-07-20 ENCOUNTER — Other Ambulatory Visit: Payer: Self-pay | Admitting: Family Medicine

## 2018-07-20 MED ORDER — QUETIAPINE FUMARATE ER 50 MG PO TB24: 100.0000 mg | ORAL_TABLET | Freq: Every day | ORAL | 1 refills | Status: DC

## 2018-07-20 NOTE — Telephone Encounter (Signed)
Message relayed to patient. Verbalized understanding and denied questions.   

## 2018-07-20 NOTE — Addendum Note (Signed)
Addended by: Dorcas Carrow on: 07/20/2018 04:47 PM   Modules accepted: Orders

## 2018-07-20 NOTE — Telephone Encounter (Signed)
He can take 2 at a time and I'll send him in the higher dose.

## 2018-08-12 ENCOUNTER — Other Ambulatory Visit: Payer: Self-pay

## 2018-08-12 ENCOUNTER — Ambulatory Visit (INDEPENDENT_AMBULATORY_CARE_PROVIDER_SITE_OTHER): Payer: Medicare Other | Admitting: Family Medicine

## 2018-08-12 ENCOUNTER — Encounter: Payer: Self-pay | Admitting: Family Medicine

## 2018-08-12 VITALS — BP 114/68 | HR 93 | Wt 199.0 lb

## 2018-08-12 DIAGNOSIS — N183 Chronic kidney disease, stage 3 unspecified: Secondary | ICD-10-CM

## 2018-08-12 DIAGNOSIS — E1122 Type 2 diabetes mellitus with diabetic chronic kidney disease: Secondary | ICD-10-CM

## 2018-08-12 NOTE — Assessment & Plan Note (Addendum)
Has really been watching his diet and carbohydrates. Has been feeling a lot better. Has been taking his metformin. Feeling well. Continue current regimen. Continue to monitor. Call with any concerns.

## 2018-08-12 NOTE — Progress Notes (Signed)
BP 114/68   Pulse 93   Wt 199 lb (90.3 kg)   BMI 30.26 kg/m    Subjective:    Patient ID: David Blackburn, male    DOB: 03/17/1951, 68 y.o.   MRN: [id number]  HPI: David Blackburn is a 68 y.o. male  Chief Complaint  Patient presents with  . Diabetes   DIABETES Hypoglycemic episodes:no Polydipsia/polyuria: no Visual disturbance: no Chest pain: no Paresthesias: no Glucose Monitoring: no  Accucheck frequency: Not Checking Taking Insulin?: no Blood Pressure Monitoring: a few times a month Retinal Examination: Up to Date Foot Exam: Up to Date Diabetic Education: Completed Pneumovax: Up to Date Influenza: Up to Date Aspirin: yes  Relevant past medical, surgical, family and social history reviewed and updated as indicated. Interim medical history since our last visit reviewed. Allergies and medications reviewed and updated.  Review of Systems  Constitutional: Negative.   Respiratory: Negative.   Cardiovascular: Negative.   Gastrointestinal: Negative.   Musculoskeletal: Negative.   Neurological: Negative.   Psychiatric/Behavioral: Negative.     Per HPI unless specifically indicated above     Objective:    BP 114/68   Pulse 93   Wt 199 lb (90.3 kg)   BMI 30.26 kg/m   Wt Readings from Last 3 Encounters:  08/12/18 199 lb (90.3 kg)  05/13/18 208 lb (94.3 kg)  04/13/18 212 lb (96.2 kg)    Physical Exam Vitals signs and nursing note reviewed.  Pulmonary:     Effort: Pulmonary effort is normal. No respiratory distress.     Comments: Speaking in full sentences Neurological:     Mental Status: He is alert.  Psychiatric:        Mood and Affect: Mood normal.        Behavior: Behavior normal.        Thought Content: Thought content normal.        Judgment: Judgment normal.     Results for orders placed or performed in visit on 05/13/18  Bayer DCA Hb A1c Waived  Result Value Ref Range   HB A1C (BAYER DCA - WAIVED) 6.3 <7.0 %      Assessment & Plan:    Problem List Items Addressed This Visit      Endocrine   Controlled type 2 diabetes with renal manifestation (HCC) - Primary    Has really been watching his diet and carbohydrates. Has been feeling a lot better. Has been taking his metformin. Feeling well. Continue current regimen. Continue to monitor. Call with any concerns.       Relevant Orders   Bayer DCA Hb A1c Waived       Follow up plan: Return in about 3 months (around 11/11/2018).   . This visit was completed via telephone due to the restrictions of the COVID-19 pandemic. All issues as above were discussed and addressed but no physical exam was performed. If it was felt that the patient should be evaluated in the office, they were directed there. The patient verbally consented to this visit. Patient was unable to complete an audio/visual visit due to Lack of equipment. Due to the catastrophic nature of the COVID-19 pandemic, this visit was done through audio contact only. . Location of the patient: home . Location of the provider: home . Those involved with this call:  . Provider: Olevia PerchesMegan , DO . CMA: Tiffany Reel, CMA . Front Desk/Registration: Adela Portshristan Williamson  . Time spent on call: 15 minutes on the phone discussing health concerns.  23 minutes total spent in review of patient's record and preparation of their chart.

## 2018-08-16 ENCOUNTER — Other Ambulatory Visit: Payer: Medicare Other

## 2018-08-16 ENCOUNTER — Other Ambulatory Visit: Payer: Self-pay

## 2018-08-16 DIAGNOSIS — E1122 Type 2 diabetes mellitus with diabetic chronic kidney disease: Secondary | ICD-10-CM | POA: Diagnosis not present

## 2018-08-16 DIAGNOSIS — N183 Chronic kidney disease, stage 3 unspecified: Secondary | ICD-10-CM

## 2018-08-16 LAB — BAYER DCA HB A1C WAIVED: HB A1C (BAYER DCA - WAIVED): 6.5 % (ref <7.0)

## 2018-08-18 ENCOUNTER — Encounter: Payer: Self-pay | Admitting: Family Medicine

## 2018-08-30 ENCOUNTER — Other Ambulatory Visit: Payer: Self-pay | Admitting: Family Medicine

## 2018-08-30 NOTE — Telephone Encounter (Signed)
Requested medication (s) are due for refill today: Yes  Requested medication (s) are on the active medication list: Yes  Last refill:  06/24/18  Future visit scheduled: Yes  Notes to clinic:  Unable to refill, cannot delegate     Requested Prescriptions  Pending Prescriptions Disp Refills   cyclobenzaprine (FLEXERIL) 10 MG tablet [Pharmacy Med Name: CYCLOBENZAPRINE HCL 10 MG TAB] 30 tablet 1    Sig: TAKE 1 TABLET BY MOUTH AT BEDTIME     Not Delegated - Analgesics:  Muscle Relaxants Failed - 08/30/2018  3:03 PM      Failed - This refill cannot be delegated      Passed - Valid encounter within last 6 months    Recent Outpatient Visits          2 weeks ago Controlled type 2 diabetes mellitus with stage 3 chronic kidney disease, without long-term current use of insulin (HCC)   [facility] [place], Megan P, DO   3 months ago 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, [place], DO      Future Appointments            In 2 months Laural Benes, Oralia Rud, DO [facility], [facility]

## 2018-09-10 ENCOUNTER — Telehealth: Payer: Self-pay | Admitting: Family Medicine

## 2018-09-10 NOTE — Telephone Encounter (Signed)
Appt scheduled

## 2018-09-10 NOTE — Telephone Encounter (Signed)
OK for in office on Monday, if he's concerned or stops urinating before that, needs to go to urgent care or ER

## 2018-09-10 NOTE — Telephone Encounter (Signed)
Patient states that he has urinated, but it has decreased a lot, no pain, just unsure of what could cause the decrease, just experiencing a big change.

## 2018-09-10 NOTE — Telephone Encounter (Signed)
If he has not urinated in several days- he needs to go to the ER, he may need to be catherized and we cannot do that in the office.

## 2018-09-10 NOTE — Telephone Encounter (Signed)
Copied from CRM [id number]. Topic: General - Call Back - No Documentation >> Sep 10, 2018 12:28 PM Randol Kern wrote: Reason for CRM: Pt requesting call back from providers. He has been unable to urinate for a few days and is worried that he is experiencing kidney failure. Please advise  Best Contact:  [phone] >> Sep 10, 2018  2:01 PM Sharol Given wrote: Posey Rea if this is something that can be virtual. Please advise.

## 2018-09-13 ENCOUNTER — Ambulatory Visit: Payer: Self-pay | Admitting: Family Medicine

## 2018-09-17 ENCOUNTER — Other Ambulatory Visit: Payer: Self-pay

## 2018-09-17 NOTE — Patient Outreach (Signed)
[facility]) Care Management  09/17/2018  David Blackburn [birth year] [id number]   Medication Adherence call to [name] [facility] [employer] [facility] spoke with patient he is due on [name] patient explain he is taking 1 tablet daily and is running out patient ask if we can call [facility] patient wants a 90 days supply. Pharmacy will have it ready for patient  to pick up . [name] is showing past due under [facility] Ins.   Lillia Abed CPhT Pharmacy Technician [facility] Care Management Direct Dial [phone]  Fax [phone] [email or web address]

## 2018-09-21 NOTE — Telephone Encounter (Signed)
You can transfer him to a triage nurse, but he will need an appointment.

## 2018-09-21 NOTE — Telephone Encounter (Signed)
LVM for pt to call back.

## 2018-09-21 NOTE — Telephone Encounter (Signed)
Patient said he is having decreased urination but is refusing an appointment. He asked me over the phone what it could be and I advised him he needs to scheduled something with Dr Laural Benes to discuss this. He refused. He would like to speak to a nurse. [phone]

## 2018-09-22 ENCOUNTER — Ambulatory Visit: Payer: Self-pay

## 2018-09-22 ENCOUNTER — Ambulatory Visit: Payer: Self-pay | Admitting: *Deleted

## 2018-09-22 NOTE — Telephone Encounter (Signed)
Patient has questions about  creatine and amalyse and their functions in the body.Patient is not in pain- he reports he is having changes in his urine stream. He was triaged earlier today and declined to make an appointment. Discussed reasons patient would need to be seen. He is ready to schedule an appointment.

## 2018-09-22 NOTE — Telephone Encounter (Signed)
Returned call to patient who states that he has noticed a change in his voiding habits over the past month.  He states that he does not void as often. He denies pain with urination. He denies the feeling that he is not emptying his bladder.  He denies odor to his urine. He states that he has no swelling or edema to his feet legs or ankles. He has no flank pain. He is not feeling SOB. He states that he feels he is drinking adequate fluids. His urine is normal color not dark. He mentions that he has noticed his stream is not as forceful but continues to denies the feeling of no emptying his bladder. Pt refused appointment. He states if he gets any swelling or pain or worsening of symptoms he will call back.  Reason for Disposition . All other urine symptoms  Answer Assessment - Initial Assessment Questions 1. SYMPTOM: "What's the main symptom you're concerned about?" (e.g., frequency, incontinence)     Not a strong stream 2. ONSET: "When did the  change  start?"     1 month 3. PAIN: "Is there any pain?" If so, ask: "How bad is it?" (Scale: 1-10; mild, moderate, severe)     None 4. CAUSE: "What do you think is causing the symptoms?"     No swelling, no pain weaker stream 5. OTHER SYMPTOMS: "Do you have any other symptoms?" (e.g., fever, flank pain, blood in urine, pain with urination)     none 6. PREGNANCY: "Is there any chance you are pregnant?" "When was your last menstrual period?"    N/A  Protocols used: URINARY [facility]

## 2018-09-23 ENCOUNTER — Encounter: Payer: Self-pay | Admitting: Family Medicine

## 2018-09-23 ENCOUNTER — Ambulatory Visit (INDEPENDENT_AMBULATORY_CARE_PROVIDER_SITE_OTHER): Payer: Medicare Other | Admitting: Family Medicine

## 2018-09-23 ENCOUNTER — Other Ambulatory Visit: Payer: Self-pay

## 2018-09-23 VITALS — BP 105/70

## 2018-09-23 DIAGNOSIS — R3912 Poor urinary stream: Secondary | ICD-10-CM

## 2018-09-23 DIAGNOSIS — N401 Enlarged prostate with lower urinary tract symptoms: Secondary | ICD-10-CM

## 2018-09-23 DIAGNOSIS — N400 Enlarged prostate without lower urinary tract symptoms: Secondary | ICD-10-CM | POA: Insufficient documentation

## 2018-09-23 MED ORDER — TAMSULOSIN HCL 0.4 MG PO CAPS: 0.4000 mg | ORAL_CAPSULE | Freq: Every day | ORAL | 3 refills | Status: DC

## 2018-09-23 NOTE — Assessment & Plan Note (Signed)
Will start on flomax and see how he does. If not getting better, let us know. Recheck at follow up in 2 month if not sooner. Call with any concerns.

## 2018-09-23 NOTE — Progress Notes (Signed)
BP 105/70    Subjective:    Patient ID: David Blackburn, male    DOB: 1950/07/15, 68 y.o.   MRN: [id number]  HPI: David Blackburn is a 68 y.o. male  Chief Complaint  Patient presents with  . Urine Output    does not have the urge to urinate   Urinary Issues- has not been peeing as frequently as he had been for most of his life, no pain, no swelling, no blood Status: exacerbated Duration: 2-3 weeks Nocturia: no Urinary frequency:no Incomplete voiding: no Urgency: no Weak urinary stream: yes Straining to start stream: no Dysuria: no Onset: gradual Severity: moderate  Relevant past medical, surgical, family and social history reviewed and updated as indicated. Interim medical history since our last visit reviewed. Allergies and medications reviewed and updated.  Review of Systems  Constitutional: Negative.   Respiratory: Negative.   Genitourinary: Positive for decreased urine volume and difficulty urinating. Negative for discharge, dysuria, enuresis, flank pain, frequency, genital sores, hematuria, penile pain, penile swelling, scrotal swelling, testicular pain and urgency.  Musculoskeletal: Negative.   Neurological: Negative.   Hematological: Negative.   Psychiatric/Behavioral: Negative.     Per HPI unless specifically indicated above     Objective:    BP 105/70   Wt Readings from Last 3 Encounters:  08/12/18 199 lb (90.3 kg)  05/13/18 208 lb (94.3 kg)  04/13/18 212 lb (96.2 kg)    Physical Exam Vitals signs and nursing note reviewed.  Pulmonary:     Effort: Pulmonary effort is normal. No respiratory distress.     Comments: Speaking in full sentences Neurological:     Mental Status: He is alert.  Psychiatric:        Mood and Affect: Mood normal.        Behavior: Behavior normal.        Thought Content: Thought content normal.        Judgment: Judgment normal.     Results for orders placed or performed in visit on 08/16/18  Bayer DCA Hb A1c Waived   Result Value Ref Range   HB A1C (BAYER DCA - WAIVED) 6.5 <7.0 %      Assessment & Plan:   Problem List Items Addressed This Visit      Genitourinary   BPH (benign prostatic hyperplasia) - Primary    Will start on flomax and see how he does. If not getting better, let us know. Recheck at follow up in 2 month if not sooner. Call with any concerns.       Relevant Medications   tamsulosin (FLOMAX) 0.4 MG CAPS capsule       Follow up plan: Return As scheduled.   . This visit was completed via telephone due to the restrictions of the COVID-19 pandemic. All issues as above were discussed and addressed but no physical exam was performed. If it was felt that the patient should be evaluated in the office, they were directed there. The patient verbally consented to this visit. Patient was unable to complete an audio/visual visit due to Lack of equipment. Due to the catastrophic nature of the COVID-19 pandemic, this visit was done through audio contact only. . Location of the patient: home . Location of the provider: home . Those involved with this call:  . Provider: Olevia Perches, DO . CMA: Tiffany Reel, CMA . Front Desk/Registration: Adela Ports  . Time spent on call: 23 minutes on the phone discussing health concerns. 25 minutes total spent in  review of patient's record and preparation of their chart.

## 2018-09-29 ENCOUNTER — Telehealth: Payer: Self-pay | Admitting: Family Medicine

## 2018-09-29 DIAGNOSIS — F4321 Adjustment disorder with depressed mood: Secondary | ICD-10-CM

## 2018-09-29 DIAGNOSIS — F3181 Bipolar II disorder: Secondary | ICD-10-CM

## 2018-09-29 NOTE — Telephone Encounter (Signed)
Copied from CRM [id number]. Topic: General - Inquiry >> Sep 29, 2018  4:10 PM Reggie Pile, NT wrote: Reason for CRM: Patient is calling in stating he has seen an advertisment for ozimptic and is inquiring about it due to him being type 2 diabetic. Call back is [phone]

## 2018-09-29 NOTE — Telephone Encounter (Signed)
Called and left patient a VM asking for him to please return my call.  

## 2018-09-29 NOTE — Telephone Encounter (Signed)
His sugars are under great control. He does not need ozempic at this time.

## 2018-09-29 NOTE — Telephone Encounter (Addendum)
Pt called back to inquire, requesting call back. Seeking Therapist. Please advise, Seeking referral.

## 2018-09-30 NOTE — Telephone Encounter (Signed)
Called and left a message for patient to return my call.  

## 2018-10-01 NOTE — Telephone Encounter (Signed)
Patient returned my call. He states he would like to be referred to a therapist, for mental health. States she has had a few deaths recently, states he is not really depressed. Just wants to talk with someone.

## 2018-10-01 NOTE — Telephone Encounter (Signed)
Called and left patient a VM asking for him to please return my call.  

## 2018-10-01 NOTE — Addendum Note (Signed)
Addended by: Dorcas Carrow on: 10/01/2018 03:26 PM   Modules accepted: Orders

## 2018-10-07 ENCOUNTER — Ambulatory Visit: Payer: Self-pay

## 2018-10-07 NOTE — Telephone Encounter (Signed)
Returned patient call. He states that he just ned to know about artificial sweeteners. He wants to know if they are good for diabetics. Pt was informed that it is best to limit the use of artificial sweetener. Water wold be preferred over diet soft drinks. He was told about research that show that the artificial sweeteners can cause weight gain and cause sugar cravings. David Blackburn verbalized understanding of all information. David Blackburn refused an offer to request visit with nutritionist.  He states he will speak with Dr Wynetta Emery about this at his next appointment.  Reason for Disposition . General information question, no triage required and triager able to answer question  Answer Assessment - Initial Assessment Questions 1. REASON FOR CALL or QUESTION: "What is your reason for calling today?" or "How can I best help you?" or "What question do you have that I can help answer?"     Are artificial sweetners go for you.  Protocols used: INFORMATION ONLY CALL-A-AH

## 2018-10-22 ENCOUNTER — Other Ambulatory Visit: Payer: Self-pay | Admitting: Family Medicine

## 2018-10-22 NOTE — Telephone Encounter (Signed)
Requested Prescriptions  Pending Prescriptions Disp Refills  . atorvastatin (LIPITOR) 40 MG tablet [Pharmacy Med Name: ATORVASTATIN CALCIUM 40 MG TAB] 90 tablet 1    Sig: TAKE 1 TABLET BY MOUTH ONCE DAILY     Cardiovascular:  Antilipid - Statins Failed - 10/22/2018 11:30 AM      Failed - HDL in normal range and within 360 days    HDL  Date Value Ref Range Status  03/08/2018 38 (L) >39 mg/dL Final         Failed - Triglycerides in normal range and within 360 days    Triglycerides  Date Value Ref Range Status  03/08/2018 185 (H) 0 - 149 mg/dL Final         Passed - Total Cholesterol in normal range and within 360 days    Cholesterol, Total  Date Value Ref Range Status  03/08/2018 149 100 - 199 mg/dL Final         Passed - LDL in normal range and within 360 days    LDL Calculated  Date Value Ref Range Status  03/08/2018 74 0 - 99 mg/dL Final         Passed - Patient is not pregnant      Passed - Valid encounter within last 12 months    Recent Outpatient Visits          4 weeks ago Benign prostatic hyperplasia with weak urinary stream   [facility] [place], Megan P, DO   2 months ago Controlled type 2 diabetes mellitus with stage 3 chronic kidney disease, without long-term current use of insulin ([place])   [place], Megan P, DO   5 months ago Poorly controlled type 2 diabetes mellitus with renal complication ([place])   [place], Megan P, DO   6 months ago Bipolar 2 disorder ([place])   [place], Megan P, DO   7 months ago Hyperlipidemia associated with type 2 diabetes mellitus ([place])   [place], Megan P, DO      Future Appointments            In 3 weeks Johnson, Megan P, DO [place], [facility]           . metFORMIN (GLUCOPHAGE-XR) 500 MG 24 hr tablet [Pharmacy Med Name: METFORMIN HCL ER 500 MG TAB] 90 tablet 1    Sig: TAKE 1 TABLET BY MOUTH ONCE DAILY WITH  BREAKFAST     Endocrinology:  Diabetes - Biguanides Passed - 10/22/2018 11:30 AM      Passed - Cr in normal range and within 360 days    Creatinine  Date Value Ref Range Status  08/04/2011 1.12 0.60 - 1.30 mg/dL Final   Creatinine, Ser  Date Value Ref Range Status  03/08/2018 1.11 0.76 - 1.27 mg/dL Final         Passed - HBA1C is between 0 and 7.9 and within 180 days    Hemoglobin A1C  Date Value Ref Range Status  06/27/2014 6.2  Final   HB A1C (BAYER DCA - WAIVED)  Date Value Ref Range Status  08/16/2018 6.5 <7.0 % Final    Comment:                                          Diabetic Adult            <  7.0                                       Healthy Adult        4.3 - 5.7                                                           (DCCT/NGSP) American Diabetes Association's Summary of Glycemic Recommendations for Adults with Diabetes: Hemoglobin A1c <7.0%. More stringent glycemic goals (A1c <6.0%) may further reduce complications at the cost of increased risk of hypoglycemia.          Passed - eGFR in normal range and within 360 days    EGFR (African American)  Date Value Ref Range Status  08/04/2011 >60 >40m/min Final   GFR calc Af Amer  Date Value Ref Range Status  03/08/2018 80 >59 mL/min/1.73 Final   EGFR (Non-African Amer.)  Date Value Ref Range Status  08/04/2011 >60 >650mmin Final    Comment:    eGFR values <6051min/1.73 m2 may be an indication of chronic kidney disease (CKD). Calculated eGFR, using the MRDR Study equation, is useful in  patients with stable renal function. The eGFR calculation will not be reliable in acutely ill patients when serum creatinine is changing rapidly. It is not useful in patients on dialysis. The eGFR calculation may not be applicable to patients at the low and high extremes of body sizes, pregnant women, and vegetarians.    GFR calc non Af Amer  Date Value Ref Range Status  03/08/2018 69 >59 mL/min/1.73 Final          Passed - Valid encounter within last 6 months    Recent Outpatient Visits          4 weeks ago Benign prostatic hyperplasia with weak urinary stream   [facility]   2 months ago Controlled type 2 diabetes mellitus with stage 3 chronic kidney disease, without long-term current use of insulin ([facility], [facility]   5 months ago Poorly controlled type 2 diabetes mellitus with renal complication ([facility], [facility]   6 months ago [facility]   7 months ago Hyperlipidemia associated with type 2 diabetes mellitus ([facility], DO      Future Appointments            In 3 weeks JohWynetta EmeryegBarb MerinoO [facility]

## 2018-11-08 ENCOUNTER — Telehealth: Payer: Self-pay

## 2018-11-08 ENCOUNTER — Ambulatory Visit: Payer: Self-pay | Admitting: Licensed Clinical Social Worker

## 2018-11-08 NOTE — Chronic Care Management (AMB) (Signed)
  Chronic Care Management    Clinical Social Work Follow Up Note  11/08/2018 Name: David Blackburn MRN: [id number] DOB: [birth year]  David Blackburn is a 68 y.o. year old male who is a primary care patient of Valerie Roys, DO. The CCM team was consulted for assistance with Grief Counseling.   Review of patient status, including review of consultants reports, other relevant assessments, and collaboration with appropriate care team members and the patient's provider was performed as part of comprehensive patient evaluation and provision of chronic care management services.     LCSW completed CCM outreach attempt today but was unable to reach patient successfully. A HIPPA compliant voice message was left encouraging patient to return call once available. LCSW rescheduled CCM SW appointment as well.  Follow Up Plan: SW will follow up with patient by phone over the next 2-4 weeks  Eula Fried, [place], MSW, [email or web address] Phone: [phone]

## 2018-11-11 DIAGNOSIS — E119 Type 2 diabetes mellitus without complications: Secondary | ICD-10-CM | POA: Diagnosis not present

## 2018-11-11 DIAGNOSIS — Z0100 Encounter for examination of eyes and vision without abnormal findings: Secondary | ICD-10-CM | POA: Diagnosis not present

## 2018-11-11 LAB — HM DIABETES EYE EXAM

## 2018-11-18 ENCOUNTER — Ambulatory Visit (INDEPENDENT_AMBULATORY_CARE_PROVIDER_SITE_OTHER): Payer: Medicare Other | Admitting: Family Medicine

## 2018-11-18 ENCOUNTER — Encounter: Payer: Self-pay | Admitting: Family Medicine

## 2018-11-18 ENCOUNTER — Other Ambulatory Visit: Payer: Self-pay

## 2018-11-18 VITALS — BP 124/83 | HR 86 | Temp 98.1°F | Ht 68.0 in | Wt 194.0 lb

## 2018-11-18 DIAGNOSIS — R3912 Poor urinary stream: Secondary | ICD-10-CM | POA: Diagnosis not present

## 2018-11-18 DIAGNOSIS — E1122 Type 2 diabetes mellitus with diabetic chronic kidney disease: Secondary | ICD-10-CM | POA: Diagnosis not present

## 2018-11-18 DIAGNOSIS — H9193 Unspecified hearing loss, bilateral: Secondary | ICD-10-CM

## 2018-11-18 DIAGNOSIS — Z23 Encounter for immunization: Secondary | ICD-10-CM | POA: Diagnosis not present

## 2018-11-18 DIAGNOSIS — E1169 Type 2 diabetes mellitus with other specified complication: Secondary | ICD-10-CM

## 2018-11-18 DIAGNOSIS — N401 Enlarged prostate with lower urinary tract symptoms: Secondary | ICD-10-CM

## 2018-11-18 DIAGNOSIS — F3181 Bipolar II disorder: Secondary | ICD-10-CM

## 2018-11-18 DIAGNOSIS — N183 Chronic kidney disease, stage 3 (moderate): Secondary | ICD-10-CM

## 2018-11-18 DIAGNOSIS — I10 Essential (primary) hypertension: Secondary | ICD-10-CM

## 2018-11-18 DIAGNOSIS — E785 Hyperlipidemia, unspecified: Secondary | ICD-10-CM

## 2018-11-18 DIAGNOSIS — I129 Hypertensive chronic kidney disease with stage 1 through stage 4 chronic kidney disease, or unspecified chronic kidney disease: Secondary | ICD-10-CM

## 2018-11-18 LAB — BAYER DCA HB A1C WAIVED: HB A1C (BAYER DCA - WAIVED): 6.1 % (ref <7.0)

## 2018-11-18 MED ORDER — LOSARTAN POTASSIUM 50 MG PO TABS: 50.0000 mg | ORAL_TABLET | Freq: Every day | ORAL | 1 refills | Status: AC

## 2018-11-18 MED ORDER — QUETIAPINE FUMARATE ER 50 MG PO TB24: 100.0000 mg | ORAL_TABLET | Freq: Every day | ORAL | 1 refills | Status: AC

## 2018-11-18 MED ORDER — ATORVASTATIN CALCIUM 40 MG PO TABS: 40.0000 mg | ORAL_TABLET | Freq: Every day | ORAL | 1 refills | Status: AC

## 2018-11-18 MED ORDER — TAMSULOSIN HCL 0.4 MG PO CAPS: 0.4000 mg | ORAL_CAPSULE | Freq: Every day | ORAL | 1 refills | Status: DC

## 2018-11-18 MED ORDER — METFORMIN HCL ER 500 MG PO TB24: ORAL_TABLET | ORAL | 1 refills | Status: AC

## 2018-11-18 NOTE — Assessment & Plan Note (Signed)
Under good control on current regimen. Continue current regimen. Continue to monitor. Call with any concerns. Refills given. Labs drawn today.   

## 2018-11-18 NOTE — Assessment & Plan Note (Signed)
Under good control with A1c of 6.1. Will continue current regimen. If still doing well in 3 months, will stop metformin.

## 2018-11-18 NOTE — Progress Notes (Signed)
BP 124/83   Pulse 86   Temp 98.1 F (36.7 C) (Oral)   Ht 5\' 8"  (1.727 m)   Wt 194 lb (88 kg)   SpO2 96%   BMI 29.50 kg/m    Subjective:    Patient ID: David Blackburn, male    DOB: 09/11/1950, 68 y.o.   MRN: [id number]  HPI: David Blackburn is a 68 y.o. male  Chief Complaint  Patient presents with  . Diabetes  . Hypertension  . Hyperlipidemia  . Referral    for hearing aids request   DIABETES Hypoglycemic episodes:no Polydipsia/polyuria: no Visual disturbance: no Chest pain: no Paresthesias: no Glucose Monitoring: yes  Accucheck frequency: Daily Taking Insulin?: no Blood Pressure Monitoring: not checking Retinal Examination: Up to Date Foot Exam: Done today Diabetic Education: Completed Pneumovax: Given today Influenza: Up to Date Aspirin: no  HYPERTENSION / HYPERLIPIDEMIA Satisfied with current treatment? yes Duration of hypertension: chronic BP monitoring frequency: not checking BP medication side effects: no Past BP meds: losartan Duration of hyperlipidemia: chronic Cholesterol medication side effects: no Cholesterol supplements: niacin Past cholesterol medications: atorvastatin Medication compliance: excellent compliance Aspirin: no Recent stressors: no Recurrent headaches: no Visual changes: no Palpitations: no Dyspnea: no Chest pain: no Lower extremity edema: no Dizzy/lightheaded: no  BPH BPH status: controlled Satisfied with current treatment?: yes Medication side effects: yes Medication compliance: excellent compliance Duration: chronic Nocturia: no Urinary frequency:no Incomplete voiding: no Urgency: no Weak urinary stream: no Straining to start stream: no Dysuria: no Onset: gradual Severity: mild  Relevant past medical, surgical, family and social history reviewed and updated as indicated. Interim medical history since our last visit reviewed. Allergies and medications reviewed and updated.  Review of Systems   Constitutional: Negative.   Respiratory: Negative.   Cardiovascular: Negative.   Musculoskeletal: Negative.   Neurological: Negative.   Psychiatric/Behavioral: Negative.     Per HPI unless specifically indicated above     Objective:    BP 124/83   Pulse 86   Temp 98.1 F (36.7 C) (Oral)   Ht 5\' 8"  (1.727 m)   Wt 194 lb (88 kg)   SpO2 96%   BMI 29.50 kg/m   Wt Readings from Last 3 Encounters:  11/18/18 194 lb (88 kg)  08/12/18 199 lb (90.3 kg)  05/13/18 208 lb (94.3 kg)    Physical Exam Vitals signs and nursing note reviewed.  Constitutional:      General: He is not in acute distress.    Appearance: Normal appearance. He is not ill-appearing, toxic-appearing or diaphoretic.  HENT:     Head: Normocephalic and atraumatic.     Right Ear: External ear normal.     Left Ear: External ear normal.     Nose: Nose normal.     Mouth/Throat:     Mouth: Mucous membranes are moist.     Pharynx: Oropharynx is clear.  Eyes:     General: No scleral icterus.       Right eye: No discharge.        Left eye: No discharge.     Extraocular Movements: Extraocular movements intact.     Conjunctiva/sclera: Conjunctivae normal.     Pupils: Pupils are equal, round, and reactive to light.  Neck:     Musculoskeletal: Normal range of motion and neck supple.  Cardiovascular:     Rate and Rhythm: Normal rate and regular rhythm.     Pulses: Normal pulses.     Heart sounds: Normal heart sounds.  No murmur. No friction rub. No gallop.   Pulmonary:     Effort: Pulmonary effort is normal. No respiratory distress.     Breath sounds: Normal breath sounds. No stridor. No wheezing, rhonchi or rales.  Chest:     Chest wall: No tenderness.  Musculoskeletal: Normal range of motion.  Skin:    General: Skin is warm and dry.     Capillary Refill: Capillary refill takes less than 2 seconds.     Coloration: Skin is not jaundiced or pale.     Findings: No bruising, erythema, lesion or rash.   Neurological:     General: No focal deficit present.     Mental Status: He is alert and oriented to person, place, and time. Mental status is at baseline.  Psychiatric:        Mood and Affect: Mood normal.        Behavior: Behavior normal.        Thought Content: Thought content normal.        Judgment: Judgment normal.     Results for orders placed or performed in visit on 08/16/18  Bayer DCA Hb A1c Waived  Result Value Ref Range   HB A1C (BAYER DCA - WAIVED) 6.5 <7.0 %      Assessment & Plan:   Problem List Items Addressed This Visit      Endocrine   Controlled type 2 diabetes with renal manifestation (HCC) - Primary    Under good control with A1c of 6.1. Will continue current regimen. If still doing well in 3 months, will stop metformin.       Relevant Medications   losartan (COZAAR) 50 MG tablet   atorvastatin (LIPITOR) 40 MG tablet   metFORMIN (GLUCOPHAGE-XR) 500 MG 24 hr tablet   Other Relevant Orders   Comprehensive metabolic panel   Microalbumin, Urine Waived   Bayer DCA Hb A1c Waived   Hyperlipidemia associated with type 2 diabetes mellitus (HCC)    Under good control on current regimen. Continue current regimen. Continue to monitor. Call with any concerns. Refills given. Labs drawn today.       Relevant Medications   losartan (COZAAR) 50 MG tablet   atorvastatin (LIPITOR) 40 MG tablet   metFORMIN (GLUCOPHAGE-XR) 500 MG 24 hr tablet   Other Relevant Orders   Comprehensive metabolic panel   Lipid Panel w/o Chol/HDL Ratio     Nervous and Auditory   Bilateral hearing loss   Relevant Orders   Ambulatory referral to Audiology     Genitourinary   Benign hypertensive renal disease    Under good control on current regimen. Continue current regimen. Continue to monitor. Call with any concerns. Refills given. Labs drawn today.       Relevant Medications   losartan (COZAAR) 50 MG tablet   BPH (benign prostatic hyperplasia)    Under good control on current  regimen. Continue current regimen. Continue to monitor. Call with any concerns. Refills given. Labs drawn today.       Relevant Medications   tamsulosin (FLOMAX) 0.4 MG CAPS capsule   Other Relevant Orders   Comprehensive metabolic panel     Other   Bipolar 2 disorder (HCC)    Under good control on current regimen. Continue current regimen. Continue to monitor. Call with any concerns. Refills given.         Other Visit Diagnoses    Hypertension, unspecified type       Relevant Medications   losartan (COZAAR) 50 MG  tablet   atorvastatin (LIPITOR) 40 MG tablet   Other Relevant Orders   Comprehensive metabolic panel   Microalbumin, Urine Waived   Need for pneumococcal vaccine       Pneumovax given today.   Relevant Orders   Pneumococcal polysaccharide vaccine 23-valent greater than or equal to 2yo subcutaneous/IM       Follow up plan: Return in about 3 months (around 02/24/2019) for DM visit.

## 2018-11-18 NOTE — Assessment & Plan Note (Signed)
Under good control on current regimen. Continue current regimen. Continue to monitor. Call with any concerns. Refills given.   

## 2018-11-18 NOTE — Patient Instructions (Signed)
Pneumococcal Polysaccharide Vaccine (PPSV23): What You Need to Know 1. Why get vaccinated? Pneumococcal polysaccharide vaccine (PPSV23) can prevent pneumococcal disease. Pneumococcal disease refers to any illness caused by pneumococcal bacteria. These bacteria can cause many types of illnesses, including pneumonia, which is an infection of the lungs. Pneumococcal bacteria are one of the most common causes of pneumonia. Besides pneumonia, pneumococcal bacteria can also cause:  Ear infections  Sinus infections  Meningitis (infection of the tissue covering the brain and spinal cord)  Bacteremia (bloodstream infection) Anyone can get pneumococcal disease, but children under 2 years of age, people with certain medical conditions, adults 65 years or older, and cigarette smokers are at the highest risk. Most pneumococcal infections are mild. However, some can result in long-term problems, such as brain damage or hearing loss. Meningitis, bacteremia, and pneumonia caused by pneumococcal disease can be fatal. 2. PPSV23 PPSV23 protects against 23 types of bacteria that cause pneumococcal disease. PPSV23 is recommended for:  All adults 65 years or older,  Anyone 2 years or older with certain medical conditions that can lead to an increased risk for pneumococcal disease. Most people need only one dose of PPSV23. A second dose of PPSV23, and another type of pneumococcal vaccine called PCV13, are recommended for certain high-risk groups. Your health care provider can give you more information. People 65 years or older should get a dose of PPSV23 even if they have already gotten one or more doses of the vaccine before they turned 65. 3. Talk with your health care provider Tell your vaccine provider if the person getting the vaccine:  Has had an allergic reaction after a previous dose of PPSV23, or has any severe, life-threatening allergies. In some cases, your health care provider may decide to postpone  PPSV23 vaccination to a future visit. People with minor illnesses, such as a cold, may be vaccinated. People who are moderately or severely ill should usually wait until they recover before getting PPSV23. Your health care provider can give you more information. 4. Risks of a vaccine reaction  Redness or pain where the shot is given, feeling tired, fever, or muscle aches can happen after PPSV23. People sometimes faint after medical procedures, including vaccination. Tell your provider if you feel dizzy or have vision changes or ringing in the ears. As with any medicine, there is a very remote chance of a vaccine causing a severe allergic reaction, other serious injury, or death. 5. What if there is a serious problem? An allergic reaction could occur after the vaccinated person leaves the clinic. If you see signs of a severe allergic reaction (hives, swelling of the face and throat, difficulty breathing, a fast heartbeat, dizziness, or weakness), call 9-1-1 and get the person to the nearest hospital. For other signs that concern you, call your health care provider. Adverse reactions should be reported to the Vaccine Adverse Event Reporting System (VAERS). Your health care provider will usually file this report, or you can do it yourself. Visit the VAERS website at www.vaers.hhs.gov or call 1-800-822-7967. VAERS is only for reporting reactions, and VAERS staff do not give medical advice. 6. How can I learn more?  Ask your health care provider.  Call your local or state health department.  Contact the Centers for Disease Control and Prevention (CDC): ? Call 1-800-232-4636 (1-800-CDC-INFO) or ? Visit CDC's website at www.cdc.gov/vaccines CDC Vaccine Information Statement PPSV23 Vaccine (02/24/2018) This information is not intended to replace advice given to you by your health care provider. Make sure you   discuss any questions you have with your health care provider. Document Released: 02/09/2006  Document Revised: 08/03/2018 Document Reviewed: 11/24/2017 Elsevier Patient Education  2020 Elsevier Inc.  

## 2018-11-19 LAB — COMPREHENSIVE METABOLIC PANEL
ALT: 62 IU/L — ABNORMAL HIGH (ref 0–44)
AST: 26 IU/L (ref 0–40)
Albumin/Globulin Ratio: 2 (ref 1.2–2.2)
Albumin: 5.1 g/dL — ABNORMAL HIGH (ref 3.8–4.8)
Alkaline Phosphatase: 107 IU/L (ref 39–117)
BUN/Creatinine Ratio: 18 (ref 10–24)
BUN: 20 mg/dL (ref 8–27)
Bilirubin Total: 1.2 mg/dL (ref 0.0–1.2)
CO2: 18 mmol/L — ABNORMAL LOW (ref 20–29)
Calcium: 10.2 mg/dL (ref 8.6–10.2)
Chloride: 102 mmol/L (ref 96–106)
Creatinine, Ser: 1.14 mg/dL (ref 0.76–1.27)
GFR calc Af Amer: 77 mL/min/{1.73_m2} (ref >59)
GFR calc non Af Amer: 67 mL/min/{1.73_m2} (ref >59)
Globulin, Total: 2.5 g/dL (ref 1.5–4.5)
Glucose: 122 mg/dL — ABNORMAL HIGH (ref 65–99)
Potassium: 4.2 mmol/L (ref 3.5–5.2)
Sodium: 140 mmol/L (ref 134–144)
Total Protein: 7.6 g/dL (ref 6.0–8.5)

## 2018-11-19 LAB — LIPID PANEL W/O CHOL/HDL RATIO
Cholesterol, Total: 103 mg/dL (ref 100–199)
HDL: 37 mg/dL — ABNORMAL LOW (ref >39)
LDL Calculated: 23 mg/dL (ref 0–99)
Triglycerides: 216 mg/dL — ABNORMAL HIGH (ref 0–149)
VLDL Cholesterol Cal: 43 mg/dL — ABNORMAL HIGH (ref 5–40)

## 2018-11-28 ENCOUNTER — Encounter: Payer: Self-pay | Admitting: Family Medicine

## 2018-12-03 ENCOUNTER — Ambulatory Visit (INDEPENDENT_AMBULATORY_CARE_PROVIDER_SITE_OTHER): Payer: Medicare Other | Admitting: Licensed Clinical Social Worker

## 2018-12-03 ENCOUNTER — Other Ambulatory Visit: Payer: Self-pay

## 2018-12-03 DIAGNOSIS — F4321 Adjustment disorder with depressed mood: Secondary | ICD-10-CM | POA: Diagnosis not present

## 2018-12-03 NOTE — Chronic Care Management (AMB) (Signed)
  Chronic Care Management    Clinical Social Work General Note  12/03/2018 Name: David Blackburn MRN: [id number] DOB: [birth year]  David Blackburn is a 68 y.o. year old male who is a primary care patient of Valerie Roys, DO. The CCM was consulted to assist the patient with Mental Health Counseling and Resources and Grief Counseling.   Mr. Ra was given information about Chronic Care Management services today including:  1. CCM service includes personalized support from designated clinical staff supervised by his physician, including individualized plan of care and coordination with other care providers 2. 24/7 contact phone numbers for assistance for urgent and routine care needs. 3. Service will only be billed when office clinical staff spend 20 minutes or more in a month to coordinate care. 4. Only one practitioner may furnish and bill the service in a calendar month. 5. The patient may stop CCM services at any time (effective at the end of the month) by phone call to the office staff. 6. The patient will be responsible for cost sharing (co-pay) of up to 20% of the service fee (after annual deductible is met).  Patient agreed to services and verbal consent obtained.   Review of patient status, including review of consultants reports, relevant laboratory and other test results, and collaboration with appropriate care team members and the patient's provider was performed as part of comprehensive patient evaluation and provision of chronic care management services. LCSW provided education on available grief support and mental health resources within the area. Patient denies needing this support at this time. Patient denies wanting LCSW to send him this information as well. Patient is very appreciative of call and shares that he has been implementing healthy self-care management into his routine to combat depression/grief and it has been working effectively. Patient is agreeable to LCSW  following up in 30 days to revisit his decision to see if he is open to gaining mental health and grief support at that time.   Follow Up Plan: SW will follow up with patient by phone over the next 30 days      Eula Fried, [place], MSW, [email or web address] Phone: [phone]

## 2018-12-09 ENCOUNTER — Telehealth: Payer: Self-pay | Admitting: Family Medicine

## 2018-12-09 NOTE — Chronic Care Management (AMB) (Signed)
°  Chronic Care Management   Outreach Note  12/09/2018 Name: David Blackburn MRN: [id number] DOB: Aug 12, 1950  Referred by: Valerie Roys, DO Reason for referral : Chronic Care Management (Initial CCM outreach was unsuccessful. )   An unsuccessful telephone outreach was attempted today. The patient was referred to the case management team by for assistance with chronic care management and care coordination.   Follow Up Plan: A HIPPA compliant phone message was left for the patient providing contact information and requesting a return call.  The care management team will reach out to the patient again over the next 7 days.  If patient returns call to provider office, please advise to call [place] at [place]  [email or web address]   ??[phone]

## 2018-12-13 NOTE — Chronic Care Management (AMB) (Signed)
Chronic Care Management   Note  12/13/2018 Name: David Blackburn MRN: [id number] DOB: Jan 04, 1951  David Blackburn is a 68 y.o. year old male who is a primary care patient of Valerie Roys, DO. I reached out to David Blackburn by phone today in response to a referral sent by David Blackburn health plan.    David Blackburn was given information about Chronic Care Management services today including:  1. CCM service includes personalized support from designated clinical staff supervised by his physician, including individualized plan of care and coordination with other care providers 2. 24/7 contact phone numbers for assistance for urgent and routine care needs. 3. Service will only be billed when office clinical staff spend 20 minutes or more in a month to coordinate care. 4. Only one practitioner may furnish and bill the service in a calendar month. 5. The patient may stop CCM services at any time (effective at the end of the month) by phone call to the office staff. 6. The patient will be responsible for cost sharing (co-pay) of up to 20% of the service fee (after annual deductible is met).  Patient agreed to services and verbal consent obtained.   Follow up plan: Telephone appointment with CCM team member scheduled for: 01/18/2019  [place]  [email or web address]   ??[phone]

## 2018-12-28 ENCOUNTER — Other Ambulatory Visit: Payer: Self-pay | Admitting: Family Medicine

## 2018-12-28 NOTE — Telephone Encounter (Signed)
Patient last seen 11/18/18 and has appointment 02/21/19.

## 2018-12-28 NOTE — Telephone Encounter (Signed)
Requested medication (s) are due for refill today: no  Requested medication (s) are on the active medication list: no  Last refill: 08/30/2018  Future visit scheduled: yes  Notes to clinic: The original prescription was discontinued on 11/18/2018 by Lesle Chris, [place] for the following reason: Patient Preference. Renewing this prescription may not be appropriate   Requested Prescriptions  Pending Prescriptions Disp Refills   naproxen (NAPROSYN) 500 MG tablet [Pharmacy Med Name: NAPROXEN 500 MG TAB] 60 tablet 6    Sig: TAKE 1 TABLET BY MOUTH TWICE DAILY WITH MEALS     Analgesics:  NSAIDS Failed - 12/28/2018 12:07 PM      Failed - HGB in normal range and within 360 days    Hemoglobin  Date Value Ref Range Status  08/31/2017 15.4 13.0 - 17.7 g/dL Final         Passed - Cr in normal range and within 360 days    Creatinine  Date Value Ref Range Status  08/04/2011 1.12 0.60 - 1.30 mg/dL Final   Creatinine, Ser  Date Value Ref Range Status  11/18/2018 1.14 0.76 - 1.27 mg/dL Final         Passed - Patient is not pregnant      Passed - Valid encounter within last 12 months    Recent Outpatient Visits          1 month ago Controlled type 2 diabetes mellitus with stage 3 chronic kidney disease, without long-term current use of insulin ([place])   [place], Megan P, DO   3 months ago Benign prostatic hyperplasia with weak urinary stream   [facility] [place], Megan P, DO   4 months ago Controlled type 2 diabetes mellitus with stage 3 chronic kidney disease, without long-term current use of insulin ([place])   [place], Megan P, DO   7 months ago Poorly controlled type 2 diabetes mellitus with renal complication ([place])   [place], Megan P, DO   8 months ago Bipolar 2 disorder [facility])   [place], Megan P, DO      Future Appointments            In 1 month Johnson, Barb Merino, DO [employer], [facility]

## 2019-01-03 ENCOUNTER — Telehealth: Payer: Self-pay

## 2019-01-18 ENCOUNTER — Ambulatory Visit (INDEPENDENT_AMBULATORY_CARE_PROVIDER_SITE_OTHER): Payer: Medicare Other

## 2019-01-18 ENCOUNTER — Ambulatory Visit (INDEPENDENT_AMBULATORY_CARE_PROVIDER_SITE_OTHER): Payer: Medicare Other | Admitting: Pharmacist

## 2019-01-18 ENCOUNTER — Other Ambulatory Visit: Payer: Self-pay

## 2019-01-18 DIAGNOSIS — Z23 Encounter for immunization: Secondary | ICD-10-CM | POA: Diagnosis not present

## 2019-01-18 DIAGNOSIS — E1122 Type 2 diabetes mellitus with diabetic chronic kidney disease: Secondary | ICD-10-CM | POA: Diagnosis not present

## 2019-01-18 DIAGNOSIS — N183 Chronic kidney disease, stage 3 unspecified: Secondary | ICD-10-CM

## 2019-01-18 DIAGNOSIS — E785 Hyperlipidemia, unspecified: Secondary | ICD-10-CM | POA: Diagnosis not present

## 2019-01-18 DIAGNOSIS — I10 Essential (primary) hypertension: Secondary | ICD-10-CM | POA: Diagnosis not present

## 2019-01-18 DIAGNOSIS — E1169 Type 2 diabetes mellitus with other specified complication: Secondary | ICD-10-CM

## 2019-01-18 NOTE — Chronic Care Management (AMB) (Signed)
Chronic Care Management   Note  01/18/2019 Name: David Blackburn MRN: [id number] DOB: [birth year]   Subjective:  David Blackburn is a 68 y.o. year old male who is a primary care patient of Valerie Roys, DO. The CCM team was consulted for assistance with chronic disease management and care coordination needs.   Met with patient face to face in office today.    Review of patient status, including review of consultants reports, laboratory and other test data, was performed as part of comprehensive evaluation and provision of chronic care management services.   Objective:  Lab Results  Component Value Date   CREATININE 1.14 11/18/2018   CREATININE 1.11 03/08/2018   CREATININE 1.10 02/03/2018    Lab Results  Component Value Date   HGBA1C 6.1 11/18/2018       Component Value Date/Time   CHOL 103 11/18/2018 1346   TRIG 216 (H) 11/18/2018 1346   HDL 37 (L) 11/18/2018 1346   CHOLHDL 6.0 (H) 06/24/2017 1006   LDLCALC 23 11/18/2018 1346    Clinical ASCVD: No   BP Readings from Last 3 Encounters:  11/18/18 124/83  09/23/18 105/70  08/12/18 114/68    No Known Allergies  Medications Reviewed Today    Reviewed by De Hollingshead, [facility] (Pharmacist) on 01/18/19 at 34  Med List Status: <None>  Medication Order Taking? Sig Documenting Provider Last Dose Status Informant  atorvastatin (LIPITOR) 40 MG tablet [id number] Yes Take 1 tablet (40 mg total) by mouth daily. Johnson, Megan P, DO Taking Active   cholecalciferol (VITAMIN D3) 25 MCG (1000 UT) tablet [id number] Yes Take 1,000 Units by mouth daily. [provider] Taking Active   folic acid (FOLVITE) 376 MCG tablet [id number] Yes Take 400 mcg by mouth daily. [provider] Taking Active   Inositol Niacinate (NIACIN FLUSH FREE PO) [id number] Yes Take by mouth. [provider] Taking Active   losartan (COZAAR) 50 MG tablet [id number] Yes Take 1 tablet (50 mg total) by mouth daily. Valerie Roys,  DO Taking Active            Med Note Darnelle Maffucci, Doryce Mcgregory E   Tue Jan 18, 2019  1:42 PM) HS  metFORMIN (GLUCOPHAGE-XR) 500 MG 24 hr tablet [id number] Yes TAKE 1 TABLET BY MOUTH ONCE DAILY WITH BREAKFAST Johnson, Megan P, DO Taking Active   Multiple Vitamin (MULTIVITAMIN) tablet [id number] Yes Take 1 tablet by mouth daily. [provider] Taking Active   naproxen (NAPROSYN) 500 MG tablet [id number] Yes TAKE 1 TABLET BY MOUTH TWICE DAILY WITH MEALS Johnson, Megan P, DO Taking Active   QUEtiapine (SEROQUEL XR) 50 MG TB24 24 hr tablet [id number] Yes Take 2 tablets (100 mg total) by mouth at bedtime. Valerie Roys, DO Taking Active            Assessment:   Goals Addressed            This Visit's Progress     Patient Stated   . "I want to minimize my medications" (pt-stated)       Current Barriers:  . Polypharmacy; complex patient with multiple comorbidities including bipolar disorder, alcohol use, T2DM, HTN, HLD; psoriasis/potential psoriatic arthritis  o T2DM: last A1c 6.4%, controlled on metformin 500 mg daily. Patient notes that he has completely cut out carbohydrates over the past few months. He reports his weight this morning was 183 lbs o HTN: BP at clinic well controlled, tx losartan 50 mg daily; patient reports having a blood  pressure cuff at home  o HLD: atorvastatin 40 mg daily; last LDL well at goal <70; patient reports he sporadically takes niacin because he's heard it's good for his health. Asks if he would ever be able to stop atorvastatin o Psoriasis/arthritis: takes naproxen 500 mg PRN. Notes he doesn't feel up to going to [facility] dermatology right now, but endorses significant psoriasis lesions on elbows, legs, back.  o Bipolar disorder/sleep: quetiapine 100 mg QPM; endorses sleeping well through the night and waking feeling refreshed, though asks about increasing the dose  o Substance use: endorses 2-3 drinks of beer or alcohol per night. Endorses marijuana use.   Pharmacist Clinical Goal(s):  Marland Kitchen Over the next 90 days, patient will work with PharmD and provider towards optimized medication management  Interventions: . Comprehensive medication review performed; medication list updated in electronic medical record . Discussed with patient that A1c will be checked at next appointment next monht, and discontinuation of metformin can be considered if A1c remains well controlled. He verbalized understanding . Encouraged occasional BP checks at home to ensure he is staying well controlled  . Discussed different cholesterol components and effects of medications. Discussed need to remain on statin therapy as long as tolerated for ASCVD risk reduction, given risk factors of DM, HTN, HLD. Discussed that niacin is not used as often recently d/t lack of CV protection data; additionally, his TG are much better controlled recently, likely related to carbohydrate reduction. Discussed impact of alcohol on TG. Patient may stop OTC niacin therapy.  . Encouraged taking naproxen regularly for joint inflammation. Discussed to avoid acetaminophen d/t alcohol use. Recommended he discuss psoriasis w/ Dr. Wynetta Emery at next appointment and consider re-establishing with [facility] dermatology  . D/t controlled mood and sufficient sleep, and use of other CNS sedating actions (alcohol/marijuana), recommended against increased quetiapine dose at this time. Explained this to patient; he verbalized understanding . Discussed recommendation to limit to no more than 2 drinks/day for men. Patient notes he was previously involved in [place], though isn't interested in getting re-engaged right now. He is amenable to a phone call from Eula Fried, LCSW in the future to check on grief status and substance use.  . Additionally, patient notes he stopped tamsulosin therapy, but does not endorse any symptom recurrence, so he decided to discontinue this medication  Patient Self Care Activities:  . Patient will take  medications as prescribed  Initial goal documentation        Plan: - CCM team will outreach patient in the next 4-6 weeks for continued support - PharmD will outreach patient in the next 8 weeks for continued medication management support  Catie Darnelle Maffucci, PharmD Clinical Pharmacist [place] [phone]

## 2019-01-18 NOTE — Patient Instructions (Signed)
Visit Information  Goals Addressed            This Visit's Progress     Patient Stated   . "I want to minimize my medications" (pt-stated)       Current Barriers:  . Polypharmacy; complex patient with multiple comorbidities including bipolar disorder, alcohol use, T2DM, HTN, HLD; psoriasis/potential psoriatic arthritis  o T2DM: last A1c 6.4%, controlled on metformin 500 mg daily. Patient notes that he has completely cut out carbohydrates over the past few months. He reports his weight this morning was 183 lbs o HTN: BP at clinic well controlled, tx losartan 50 mg daily; patient reports having a blood pressure cuff at home  o HLD: atorvastatin 40 mg daily; last LDL well at goal <70; patient reports he sporadically takes niacin because he's heard it's good for his health. Asks if he would ever be able to stop atorvastatin o Psoriasis/arthritis: takes naproxen 500 mg PRN. Notes he doesn't feel up to going to [facility] dermatology right now, but endorses significant psoriasis lesions on elbows, legs, back.  o Bipolar disorder/sleep: quetiapine 100 mg QPM; endorses sleeping well through the night and waking feeling refreshed, though asks about increasing the dose  o Substance use: endorses 2-3 drinks of beer or alcohol per night. Endorses marijuana use.  Pharmacist Clinical Goal(s):  Marland Kitchen Over the next 90 days, patient will work with PharmD and provider towards optimized medication management  Interventions: . Comprehensive medication review performed; medication list updated in electronic medical record . Discussed with patient that A1c will be checked at next appointment next monht, and discontinuation of metformin can be considered if A1c remains well controlled. He verbalized understanding . Encouraged occasional BP checks at home to ensure he is staying well controlled  . Discussed different cholesterol components and effects of medications. Discussed need to remain on statin therapy as long as  tolerated for ASCVD risk reduction, given risk factors of DM, HTN, HLD. Discussed that niacin is not used as often recently d/t lack of CV protection data; additionally, his TG are much better controlled recently, likely related to carbohydrate reduction. Discussed impact of alcohol on TG. Patient may stop OTC niacin therapy.  . Encouraged taking naproxen regularly for joint inflammation. Discussed to avoid acetaminophen d/t alcohol use. Recommended he discuss psoriasis w/ Dr. Wynetta Emery at next appointment and consider re-establishing with [facility] dermatology  . D/t controlled mood and sufficient sleep, and use of other CNS sedating actions (alcohol/marijuana), recommended against increased quetiapine dose at this time. Explained this to patient; he verbalized understanding . Discussed recommendation to limit to no more than 2 drinks/day for men. Patient notes he was previously involved in [place], though isn't interested in getting re-engaged right now. He is amenable to a phone call from Eula Fried, LCSW in the future to check on grief status and substance use.  . Additionally, patient notes he stopped tamsulosin therapy, but does not endorse any symptom recurrence, so he decided to discontinue this medication  Patient Self Care Activities:  . Patient will take medications as prescribed  Initial goal documentation        The patient verbalized understanding of instructions provided today and declined a print copy of patient instruction materials.   Plan: - CCM team will outreach patient in the next 4-6 weeks for continued support - PharmD will outreach patient in the next 8 weeks for continued medication management support  Catie Darnelle Maffucci, PharmD Clinical Pharmacist [place] [phone]

## 2019-01-31 ENCOUNTER — Ambulatory Visit: Payer: Self-pay | Admitting: Licensed Clinical Social Worker

## 2019-01-31 NOTE — Chronic Care Management (AMB) (Signed)
  Care Management   Follow Up Note   01/31/2019 Name: David Blackburn MRN: [id number] DOB: [birth year]  Referred by: Valerie Roys, DO Reason for referral : Care Coordination   David Blackburn is a 68 y.o. year old male who is a primary care patient of Valerie Roys, DO. The care management team was consulted for assistance with care management and care coordination needs.    Review of patient status, including review of consultants reports, relevant laboratory and other test results, and collaboration with appropriate care team members and the patient's provider was performed as part of comprehensive patient evaluation and provision of chronic care management services.    LCSW completed CCM outreach attempt today but was unable to reach patient successfully. A HIPPA compliant voice message was left encouraging patient to return call once available. LCSW rescheduled CCM SW appointment as well.  A HIPPA compliant phone message was left for the patient providing contact information and requesting a return call.   Eula Fried, BSW, MSW, [facility] [email or web address] Phone: [phone]

## 2019-02-01 ENCOUNTER — Telehealth: Payer: Self-pay

## 2019-02-04 ENCOUNTER — Ambulatory Visit: Payer: Self-pay | Admitting: Pharmacist

## 2019-02-04 DIAGNOSIS — F3181 Bipolar II disorder: Secondary | ICD-10-CM

## 2019-02-04 DIAGNOSIS — F4321 Adjustment disorder with depressed mood: Secondary | ICD-10-CM

## 2019-02-04 NOTE — Patient Instructions (Signed)
Visit Information  Goals Addressed            This Visit's Progress     Patient Stated   . "I want to minimize my medications" (pt-stated)       Current Barriers:  . Polypharmacy; complex patient with multiple comorbidities including bipolar disorder, alcohol use, T2DM, HTN, HLD; psoriasis/potential psoriatic arthritis  o T2DM: last A1c 6.4%, controlled on metformin 500 mg daily. Patient notes that he has completely cut out carbohydrates over the past few months. He reports his weight this morning was 183 lbs o HTN: BP at clinic well controlled, tx losartan 50 mg daily; patient reports having a blood pressure cuff at home  o HLD: atorvastatin 40 mg daily; last LDL well at goal <70; patient reports he sporadically takes niacin because he's heard it's good for his health. Asks if he would ever be able to stop atorvastatin o Psoriasis/arthritis: takes naproxen 500 mg PRN. Notes he doesn't feel up to going to [facility] dermatology right now, but endorses significant psoriasis lesions on elbows, legs, back.  o Bipolar disorder/sleep: quetiapine 100 mg QPM; endorses sleeping well through the night and waking feeling refreshed, though asks about increasing the dose. Patient left me a voicemail today requesting a refill on this.  o Substance use: endorses 2-3 drinks of beer or alcohol per night. Endorses marijuana use.  Pharmacist Clinical Goal(s):  Marland Kitchen Over the next 90 days, patient will work with PharmD and provider towards optimized medication management  Interventions: . Reviewed medication order. 90 day supply + 1 refill was prescribed 11/18/2018. Per outside med fills, this was filled on 11/23/2018 at [facility]. Patient should not be quite due for a refill yet if he is only taking 2 tablets QPM, however, he should also have a refill at the pharmacy.  . Attempted to call patient back but had to LVM. Encouraged him to contact [facility] for refill requests.   Patient Self Care Activities:   . Patient will take medications as prescribed  Please see past updates related to this goal by clicking on the "Past Updates" button in the selected goal         The patient verbalized understanding of instructions provided today and declined a print copy of patient instruction materials.   Plan: - Will outreach patient in ~5-6 weeks for continued medication management support  Catie Darnelle Maffucci, PharmD Clinical Pharmacist [place] [phone]

## 2019-02-04 NOTE — Chronic Care Management (AMB) (Signed)
Chronic Care Management   Follow Up Note   02/04/2019 Name: David Blackburn MRN: [id number] DOB: 17-Mar-1951  Referred by: Valerie Roys, DO Reason for referral : Chronic Care Management (Medication Management)   Bertrand Vowels Blackburn is a 68 y.o. year old male who is a primary care patient of Valerie Roys, DO. The CCM team was consulted for assistance with chronic disease management and care coordination needs.    Received call from patient today.   Review of patient status, including review of consultants reports, relevant laboratory and other test results, and collaboration with appropriate care team members and the patient's provider was performed as part of comprehensive patient evaluation and provision of chronic care management services.    SDOH (Social Determinants of Health) screening performed today: None. See Care Plan for related entries.   Advanced Directives Status: N See Care Plan and [employer]yn[employer] application for related entries.  Outpatient Encounter Medications as of 02/04/2019  Medication Sig Note  . atorvastatin (LIPITOR) 40 MG tablet Take 1 tablet (40 mg total) by mouth daily.   . cholecalciferol (VITAMIN D3) 25 MCG (1000 UT) tablet Take 1,000 Units by mouth daily.   . folic acid (FOLVITE) 409 MCG tablet Take 400 mcg by mouth daily.   . Inositol Niacinate (NIACIN FLUSH FREE PO) Take by mouth.   . losartan (COZAAR) 50 MG tablet Take 1 tablet (50 mg total) by mouth daily. 01/18/2019: HS  . metFORMIN (GLUCOPHAGE-XR) 500 MG 24 hr tablet TAKE 1 TABLET BY MOUTH ONCE DAILY WITH BREAKFAST   . Multiple Vitamin (MULTIVITAMIN) tablet Take 1 tablet by mouth daily.   . naproxen (NAPROSYN) 500 MG tablet TAKE 1 TABLET BY MOUTH TWICE DAILY WITH MEALS   . QUEtiapine (SEROQUEL XR) 50 MG TB24 24 hr tablet Take 2 tablets (100 mg total) by mouth at bedtime.    No facility-administered encounter medications on file as of 02/04/2019.      Goals Addressed            This Visit's  Progress     Patient Stated   . "I want to minimize my medications" (pt-stated)       Current Barriers:  . Polypharmacy; complex patient with multiple comorbidities including bipolar disorder, alcohol use, T2DM, HTN, HLD; psoriasis/potential psoriatic arthritis  o T2DM: last A1c 6.4%, controlled on metformin 500 mg daily. Patient notes that he has completely cut out carbohydrates over the past few months. He reports his weight this morning was 183 lbs o HTN: BP at clinic well controlled, tx losartan 50 mg daily; patient reports having a blood pressure cuff at home  o HLD: atorvastatin 40 mg daily; last LDL well at goal <70; patient reports he sporadically takes niacin because he's heard it's good for his health. Asks if he would ever be able to stop atorvastatin o Psoriasis/arthritis: takes naproxen 500 mg PRN. Notes he doesn't feel up to going to [facility] dermatology right now, but endorses significant psoriasis lesions on elbows, legs, back.  o Bipolar disorder/sleep: quetiapine 100 mg QPM; endorses sleeping well through the night and waking feeling refreshed, though asks about increasing the dose. Patient left me a voicemail today requesting a refill on this.  o Substance use: endorses 2-3 drinks of beer or alcohol per night. Endorses marijuana use.  Pharmacist Clinical Goal(s):  Marland Kitchen Over the next 90 days, patient will work with PharmD and provider towards optimized medication management  Interventions: . Reviewed medication order. 90 day supply + 1 refill  was prescribed 11/18/2018. Per outside med fills, this was filled on 11/23/2018 at [facility]. Patient should not be quite due for a refill yet if he is only taking 2 tablets QPM, however, he should also have a refill at the pharmacy.  . Attempted to call patient back but had to LVM. Encouraged him to contact [facility] for refill requests.   Patient Self Care Activities:  . Patient will take medications as prescribed  Please see past  updates related to this goal by clicking on the "Past Updates" button in the selected goal         Plan: - Will outreach patient in ~5-6 weeks for continued medication management support  Catie Feliz Beam, PharmD Clinical Pharmacist [facility] [phone]

## 2019-02-18 ENCOUNTER — Emergency Department: Payer: Medicare Other

## 2019-02-18 ENCOUNTER — Other Ambulatory Visit: Payer: Self-pay

## 2019-02-18 ENCOUNTER — Inpatient Hospital Stay
Admission: EM | Admit: 2019-02-18 | Discharge: 2019-02-27 | DRG: 870 | Disposition: E | Payer: Medicare Other | Attending: Internal Medicine | Admitting: Internal Medicine

## 2019-02-18 DIAGNOSIS — T510X1A Toxic effect of ethanol, accidental (unintentional), initial encounter: Secondary | ICD-10-CM | POA: Diagnosis present

## 2019-02-18 DIAGNOSIS — L8990 Pressure ulcer of unspecified site, unspecified stage: Secondary | ICD-10-CM | POA: Insufficient documentation

## 2019-02-18 DIAGNOSIS — R55 Syncope and collapse: Secondary | ICD-10-CM | POA: Diagnosis not present

## 2019-02-18 DIAGNOSIS — J9811 Atelectasis: Secondary | ICD-10-CM | POA: Diagnosis not present

## 2019-02-18 DIAGNOSIS — Z515 Encounter for palliative care: Secondary | ICD-10-CM | POA: Diagnosis not present

## 2019-02-18 DIAGNOSIS — Z8249 Family history of ischemic heart disease and other diseases of the circulatory system: Secondary | ICD-10-CM

## 2019-02-18 DIAGNOSIS — I10 Essential (primary) hypertension: Secondary | ICD-10-CM | POA: Diagnosis present

## 2019-02-18 DIAGNOSIS — E785 Hyperlipidemia, unspecified: Secondary | ICD-10-CM | POA: Diagnosis present

## 2019-02-18 DIAGNOSIS — N178 Other acute kidney failure: Secondary | ICD-10-CM | POA: Diagnosis not present

## 2019-02-18 DIAGNOSIS — N170 Acute kidney failure with tubular necrosis: Secondary | ICD-10-CM | POA: Diagnosis present

## 2019-02-18 DIAGNOSIS — Z135 Encounter for screening for eye and ear disorders: Secondary | ICD-10-CM | POA: Diagnosis not present

## 2019-02-18 DIAGNOSIS — M6282 Rhabdomyolysis: Secondary | ICD-10-CM

## 2019-02-18 DIAGNOSIS — Z801 Family history of malignant neoplasm of trachea, bronchus and lung: Secondary | ICD-10-CM

## 2019-02-18 DIAGNOSIS — N179 Acute kidney failure, unspecified: Secondary | ICD-10-CM | POA: Diagnosis not present

## 2019-02-18 DIAGNOSIS — A419 Sepsis, unspecified organism: Secondary | ICD-10-CM | POA: Diagnosis not present

## 2019-02-18 DIAGNOSIS — R4020 Unspecified coma: Secondary | ICD-10-CM | POA: Diagnosis not present

## 2019-02-18 DIAGNOSIS — N400 Enlarged prostate without lower urinary tract symptoms: Secondary | ICD-10-CM | POA: Diagnosis present

## 2019-02-18 DIAGNOSIS — F1010 Alcohol abuse, uncomplicated: Secondary | ICD-10-CM | POA: Diagnosis present

## 2019-02-18 DIAGNOSIS — R778 Other specified abnormalities of plasma proteins: Secondary | ICD-10-CM | POA: Diagnosis not present

## 2019-02-18 DIAGNOSIS — J9600 Acute respiratory failure, unspecified whether with hypoxia or hypercapnia: Secondary | ICD-10-CM | POA: Diagnosis not present

## 2019-02-18 DIAGNOSIS — J9602 Acute respiratory failure with hypercapnia: Secondary | ICD-10-CM | POA: Diagnosis present

## 2019-02-18 DIAGNOSIS — Z7984 Long term (current) use of oral hypoglycemic drugs: Secondary | ICD-10-CM

## 2019-02-18 DIAGNOSIS — I959 Hypotension, unspecified: Secondary | ICD-10-CM | POA: Diagnosis not present

## 2019-02-18 DIAGNOSIS — R404 Transient alteration of awareness: Secondary | ICD-10-CM | POA: Diagnosis not present

## 2019-02-18 DIAGNOSIS — J690 Pneumonitis due to inhalation of food and vomit: Secondary | ICD-10-CM | POA: Diagnosis present

## 2019-02-18 DIAGNOSIS — E875 Hyperkalemia: Principal | ICD-10-CM | POA: Diagnosis present

## 2019-02-18 DIAGNOSIS — E872 Acidosis: Secondary | ICD-10-CM | POA: Diagnosis present

## 2019-02-18 DIAGNOSIS — L409 Psoriasis, unspecified: Secondary | ICD-10-CM | POA: Diagnosis present

## 2019-02-18 DIAGNOSIS — Z66 Do not resuscitate: Secondary | ICD-10-CM | POA: Diagnosis not present

## 2019-02-18 DIAGNOSIS — I248 Other forms of acute ischemic heart disease: Secondary | ICD-10-CM | POA: Diagnosis present

## 2019-02-18 DIAGNOSIS — F3181 Bipolar II disorder: Secondary | ICD-10-CM | POA: Diagnosis present

## 2019-02-18 DIAGNOSIS — H9193 Unspecified hearing loss, bilateral: Secondary | ICD-10-CM | POA: Diagnosis present

## 2019-02-18 DIAGNOSIS — Z4682 Encounter for fitting and adjustment of non-vascular catheter: Secondary | ICD-10-CM | POA: Diagnosis not present

## 2019-02-18 DIAGNOSIS — Z79899 Other long term (current) drug therapy: Secondary | ICD-10-CM

## 2019-02-18 DIAGNOSIS — E119 Type 2 diabetes mellitus without complications: Secondary | ICD-10-CM | POA: Diagnosis present

## 2019-02-18 DIAGNOSIS — D72829 Elevated white blood cell count, unspecified: Secondary | ICD-10-CM | POA: Diagnosis not present

## 2019-02-18 DIAGNOSIS — L89152 Pressure ulcer of sacral region, stage 2: Secondary | ICD-10-CM | POA: Diagnosis present

## 2019-02-18 DIAGNOSIS — G9389 Other specified disorders of brain: Secondary | ICD-10-CM | POA: Diagnosis not present

## 2019-02-18 DIAGNOSIS — R4189 Other symptoms and signs involving cognitive functions and awareness: Secondary | ICD-10-CM | POA: Diagnosis not present

## 2019-02-18 DIAGNOSIS — R6521 Severe sepsis with septic shock: Secondary | ICD-10-CM | POA: Diagnosis present

## 2019-02-18 DIAGNOSIS — F1994 Other psychoactive substance use, unspecified with psychoactive substance-induced mood disorder: Secondary | ICD-10-CM | POA: Diagnosis present

## 2019-02-18 DIAGNOSIS — G939 Disorder of brain, unspecified: Secondary | ICD-10-CM | POA: Diagnosis not present

## 2019-02-18 DIAGNOSIS — G931 Anoxic brain damage, not elsewhere classified: Secondary | ICD-10-CM | POA: Diagnosis present

## 2019-02-18 DIAGNOSIS — R464 Slowness and poor responsiveness: Secondary | ICD-10-CM | POA: Diagnosis not present

## 2019-02-18 DIAGNOSIS — Z008 Encounter for other general examination: Secondary | ICD-10-CM

## 2019-02-18 DIAGNOSIS — Z03818 Encounter for observation for suspected exposure to other biological agents ruled out: Secondary | ICD-10-CM | POA: Diagnosis not present

## 2019-02-18 DIAGNOSIS — J9601 Acute respiratory failure with hypoxia: Secondary | ICD-10-CM | POA: Diagnosis present

## 2019-02-18 DIAGNOSIS — Z20828 Contact with and (suspected) exposure to other viral communicable diseases: Secondary | ICD-10-CM | POA: Diagnosis present

## 2019-02-18 DIAGNOSIS — Z87891 Personal history of nicotine dependence: Secondary | ICD-10-CM

## 2019-02-18 LAB — URINE DRUG SCREEN, QUALITATIVE (ARMC ONLY)
Amphetamines, Ur Screen: NOT DETECTED
Barbiturates, Ur Screen: NOT DETECTED
Benzodiazepine, Ur Scrn: POSITIVE — AB
Cannabinoid 50 Ng, Ur ~~LOC~~: NOT DETECTED
Cocaine Metabolite,Ur ~~LOC~~: NOT DETECTED
MDMA (Ecstasy)Ur Screen: NOT DETECTED
Methadone Scn, Ur: NOT DETECTED
Opiate, Ur Screen: NOT DETECTED
Phencyclidine (PCP) Ur S: NOT DETECTED
Tricyclic, Ur Screen: NOT DETECTED

## 2019-02-18 LAB — BLOOD GAS, ARTERIAL
Acid-base deficit: 12.6 mmol/L — ABNORMAL HIGH (ref 0.0–2.0)
Acid-base deficit: 8.3 mmol/L — ABNORMAL HIGH (ref 0.0–2.0)
Bicarbonate: 14.9 mmol/L — ABNORMAL LOW (ref 20.0–28.0)
Bicarbonate: 18.4 mmol/L — ABNORMAL LOW (ref 20.0–28.0)
FIO2: 0.6
FIO2: 0.6
MECHVT: 500 mL
MECHVT: 500 mL
Mechanical Rate: 20
Mechanical Rate: 20
O2 Saturation: 99.3 %
O2 Saturation: 98.8 %
PEEP: 5 cmH2O
PEEP: 5 cmH2O
Patient temperature: 37
Patient temperature: 37
RATE: 20 resp/min
pCO2 arterial: 39 mmHg (ref 32.0–48.0)
pCO2 arterial: 41 mmHg (ref 32.0–48.0)
pH, Arterial: 7.19 — CL (ref 7.350–7.450)
pH, Arterial: 7.26 — ABNORMAL LOW (ref 7.350–7.450)
pO2, Arterial: 178 mmHg — ABNORMAL HIGH (ref 83.0–108.0)
pO2, Arterial: 142 mmHg — ABNORMAL HIGH (ref 83.0–108.0)

## 2019-02-18 LAB — BASIC METABOLIC PANEL
Anion gap: 14 (ref 5–15)
Anion gap: 14 (ref 5–15)
BUN: 36 mg/dL — ABNORMAL HIGH (ref 8–23)
BUN: 36 mg/dL — ABNORMAL HIGH (ref 8–23)
CO2: 21 mmol/L — ABNORMAL LOW (ref 22–32)
CO2: 20 mmol/L — ABNORMAL LOW (ref 22–32)
Calcium: 8.6 mg/dL — ABNORMAL LOW (ref 8.9–10.3)
Calcium: 8.1 mg/dL — ABNORMAL LOW (ref 8.9–10.3)
Chloride: 105 mmol/L (ref 98–111)
Chloride: 108 mmol/L (ref 98–111)
Creatinine, Ser: 3.11 mg/dL — ABNORMAL HIGH (ref 0.61–1.24)
Creatinine, Ser: 2.73 mg/dL — ABNORMAL HIGH (ref 0.61–1.24)
GFR calc Af Amer: 23 mL/min — ABNORMAL LOW (ref >60)
GFR calc Af Amer: 27 mL/min — ABNORMAL LOW (ref >60)
GFR calc non Af Amer: 20 mL/min — ABNORMAL LOW (ref >60)
GFR calc non Af Amer: 23 mL/min — ABNORMAL LOW (ref >60)
Glucose, Bld: 146 mg/dL — ABNORMAL HIGH (ref 70–99)
Glucose, Bld: 192 mg/dL — ABNORMAL HIGH (ref 70–99)
Potassium: >7.5 mmol/L (ref 3.5–5.1)
Potassium: 5.6 mmol/L — ABNORMAL HIGH (ref 3.5–5.1)
Sodium: 140 mmol/L (ref 135–145)
Sodium: 142 mmol/L (ref 135–145)

## 2019-02-18 LAB — CBC WITH DIFFERENTIAL/PLATELET
Abs Immature Granulocytes: 0.23 10*3/uL — ABNORMAL HIGH (ref 0.00–0.07)
Basophils Absolute: 0 10*3/uL (ref 0.0–0.1)
Basophils Relative: 0 %
Eosinophils Absolute: 0 10*3/uL (ref 0.0–0.5)
Eosinophils Relative: 0 %
HCT: 47 % (ref 39.0–52.0)
Hemoglobin: 14.8 g/dL (ref 13.0–17.0)
Immature Granulocytes: 2 %
Lymphocytes Relative: 8 %
Lymphs Abs: 1.2 10*3/uL (ref 0.7–4.0)
MCH: 29.3 pg (ref 26.0–34.0)
MCHC: 31.5 g/dL (ref 30.0–36.0)
MCV: 93.1 fL (ref 80.0–100.0)
Monocytes Absolute: 1.1 10*3/uL — ABNORMAL HIGH (ref 0.1–1.0)
Monocytes Relative: 8 %
Neutro Abs: 12.1 10*3/uL — ABNORMAL HIGH (ref 1.7–7.7)
Neutrophils Relative %: 82 %
Platelets: 375 10*3/uL (ref 150–400)
RBC: 5.05 MIL/uL (ref 4.22–5.81)
RDW: 13.6 % (ref 11.5–15.5)
WBC: 14.7 10*3/uL — ABNORMAL HIGH (ref 4.0–10.5)
nRBC: 0.1 % (ref 0.0–0.2)

## 2019-02-18 LAB — URINALYSIS, COMPLETE (UACMP) WITH MICROSCOPIC
Bilirubin Urine: NEGATIVE
Glucose, UA: NEGATIVE mg/dL
Ketones, ur: NEGATIVE mg/dL
Leukocytes,Ua: NEGATIVE
Nitrite: NEGATIVE
Protein, ur: 30 mg/dL — AB
Specific Gravity, Urine: 1.008 (ref 1.005–1.030)
Squamous Epithelial / HPF: NONE SEEN (ref 0–5)
pH: 5 (ref 5.0–8.0)

## 2019-02-18 LAB — HEPATIC FUNCTION PANEL
ALT: 63 U/L — ABNORMAL HIGH (ref 0–44)
AST: 108 U/L — ABNORMAL HIGH (ref 15–41)
Albumin: 4.2 g/dL (ref 3.5–5.0)
Alkaline Phosphatase: 81 U/L (ref 38–126)
Bilirubin, Direct: 0.1 mg/dL (ref 0.0–0.2)
Indirect Bilirubin: 0.8 mg/dL (ref 0.3–0.9)
Total Bilirubin: 0.9 mg/dL (ref 0.3–1.2)
Total Protein: 7.8 g/dL (ref 6.5–8.1)

## 2019-02-18 LAB — GLUCOSE, CAPILLARY
Glucose-Capillary: 125 mg/dL — ABNORMAL HIGH (ref 70–99)
Glucose-Capillary: 174 mg/dL — ABNORMAL HIGH (ref 70–99)

## 2019-02-18 LAB — MRSA PCR SCREENING: MRSA by PCR: NEGATIVE

## 2019-02-18 LAB — TROPONIN I (HIGH SENSITIVITY)
Troponin I (High Sensitivity): 501 ng/L (ref <18)
Troponin I (High Sensitivity): 691 ng/L (ref <18)
Troponin I (High Sensitivity): 865 ng/L (ref <18)

## 2019-02-18 LAB — ACETAMINOPHEN LEVEL: Acetaminophen (Tylenol), Serum: <10 ug/mL — ABNORMAL LOW (ref 10–30)

## 2019-02-18 LAB — ETHANOL: Alcohol, Ethyl (B): <10 mg/dL (ref <10)

## 2019-02-18 LAB — PROTIME-INR
INR: 1.2 (ref 0.8–1.2)
Prothrombin Time: 15 seconds (ref 11.4–15.2)

## 2019-02-18 LAB — SARS CORONAVIRUS 2 BY RT PCR (HOSPITAL ORDER, PERFORMED IN ~~LOC~~ HOSPITAL LAB): SARS Coronavirus 2: NEGATIVE

## 2019-02-18 LAB — LACTIC ACID, PLASMA
Lactic Acid, Venous: 3.1 mmol/L (ref 0.5–1.9)
Lactic Acid, Venous: 3.2 mmol/L (ref 0.5–1.9)

## 2019-02-18 LAB — CK: Total CK: 7081 U/L — ABNORMAL HIGH (ref 49–397)

## 2019-02-18 LAB — LIPASE, BLOOD: Lipase: 51 U/L (ref 11–51)

## 2019-02-18 LAB — SALICYLATE LEVEL: Salicylate Lvl: <7 mg/dL (ref 2.8–30.0)

## 2019-02-18 LAB — PROCALCITONIN: Procalcitonin: 0.63 ng/mL

## 2019-02-18 MED ORDER — INSULIN ASPART 100 UNIT/ML IV SOLN
10.0000 [IU] | Freq: Once | INTRAVENOUS | Status: AC
  Administered 2019-02-18: 10 [IU] via INTRAVENOUS
  Filled 2019-02-18: qty 0.1

## 2019-02-18 MED ORDER — MIDAZOLAM HCL 2 MG/2ML IJ SOLN
1.0000 mg | INTRAMUSCULAR | Status: DC | PRN
  Administered 2019-02-20: 1 mg via INTRAVENOUS
  Filled 2019-02-18: qty 2

## 2019-02-18 MED ORDER — INSULIN ASPART 100 UNIT/ML ~~LOC~~ SOLN
SUBCUTANEOUS | Status: AC
  Administered 2019-02-18: 100 [IU] via INTRAVENOUS
  Filled 2019-02-18: qty 1

## 2019-02-18 MED ORDER — SODIUM CHLORIDE 0.9 % IV SOLN
1.0000 g | Freq: Once | INTRAVENOUS | Status: DC
  Filled 2019-02-18: qty 10

## 2019-02-18 MED ORDER — FOLIC ACID 1 MG PO TABS
1.0000 mg | ORAL_TABLET | Freq: Every day | ORAL | Status: DC
  Administered 2019-02-18 – 2019-02-22 (×5): 1 mg
  Filled 2019-02-18 (×5): qty 1

## 2019-02-18 MED ORDER — SODIUM CHLORIDE 0.9 % IV SOLN: 250.0000 mL | INTRAVENOUS | Status: DC

## 2019-02-18 MED ORDER — VITAMIN B-1 100 MG PO TABS
100.0000 mg | ORAL_TABLET | Freq: Every day | ORAL | Status: DC
  Administered 2019-02-18 – 2019-02-21 (×4): 100 mg via ORAL
  Filled 2019-02-18 (×4): qty 1

## 2019-02-18 MED ORDER — INSULIN ASPART 100 UNIT/ML ~~LOC~~ SOLN
0.0000 [IU] | SUBCUTANEOUS | Status: DC
  Administered 2019-02-18 – 2019-02-19 (×3): 2 [IU] via SUBCUTANEOUS
  Administered 2019-02-19: 1 [IU] via SUBCUTANEOUS
  Administered 2019-02-19 (×2): 2 [IU] via SUBCUTANEOUS
  Administered 2019-02-19: 3 [IU] via SUBCUTANEOUS
  Administered 2019-02-20 – 2019-02-22 (×5): 1 [IU] via SUBCUTANEOUS
  Administered 2019-02-22 (×2): 2 [IU] via SUBCUTANEOUS
  Administered 2019-02-22 (×2): 1 [IU] via SUBCUTANEOUS
  Administered 2019-02-23: 2 [IU] via SUBCUTANEOUS
  Administered 2019-02-23: 1 [IU] via SUBCUTANEOUS
  Administered 2019-02-23: 2 [IU] via SUBCUTANEOUS
  Filled 2019-02-18 (×18): qty 1

## 2019-02-18 MED ORDER — CHLORHEXIDINE GLUCONATE CLOTH 2 % EX PADS
6.0000 | MEDICATED_PAD | Freq: Every day | CUTANEOUS | Status: DC
  Administered 2019-02-19 – 2019-02-21 (×3): 6 via TOPICAL

## 2019-02-18 MED ORDER — SODIUM CHLORIDE 0.9 % IV SOLN
INTRAVENOUS | Status: DC
  Administered 2019-02-18 – 2019-02-22 (×4): via INTRAVENOUS
  Administered 2019-02-23: 800 mL via INTRAVENOUS

## 2019-02-18 MED ORDER — FENTANYL 2500MCG IN NS 250ML (10MCG/ML) PREMIX INFUSION
0.0000 ug/h | INTRAVENOUS | Status: DC
  Administered 2019-02-18: 25 ug/h via INTRAVENOUS
  Filled 2019-02-18: qty 250

## 2019-02-18 MED ORDER — VANCOMYCIN HCL 10 G IV SOLR
2000.0000 mg | Freq: Once | INTRAVENOUS | Status: AC
  Administered 2019-02-18: 20:00:00 2000 mg via INTRAVENOUS
  Filled 2019-02-18: qty 2000

## 2019-02-18 MED ORDER — VANCOMYCIN HCL IN DEXTROSE 1-5 GM/200ML-% IV SOLN: 1000.0000 mg | Freq: Once | INTRAVENOUS | Status: DC

## 2019-02-18 MED ORDER — CALCIUM GLUCONATE-NACL 1-0.675 GM/50ML-% IV SOLN
1.0000 g | Freq: Once | INTRAVENOUS | Status: DC
  Filled 2019-02-18: qty 50

## 2019-02-18 MED ORDER — MIDAZOLAM HCL 2 MG/2ML IJ SOLN: 1.0000 mg | INTRAMUSCULAR | Status: DC | PRN

## 2019-02-18 MED ORDER — FENTANYL CITRATE (PF) 100 MCG/2ML IJ SOLN
25.0000 ug | INTRAMUSCULAR | Status: DC | PRN
  Administered 2019-02-23 (×2): 25 ug via INTRAVENOUS
  Filled 2019-02-18 (×3): qty 2

## 2019-02-18 MED ORDER — NOREPINEPHRINE 4 MG/250ML-% IV SOLN
2.0000 ug/min | INTRAVENOUS | Status: DC
  Administered 2019-02-18: 2 ug/min via INTRAVENOUS
  Filled 2019-02-18: qty 250

## 2019-02-18 MED ORDER — FAMOTIDINE IN NACL 20-0.9 MG/50ML-% IV SOLN
20.0000 mg | INTRAVENOUS | Status: DC
  Administered 2019-02-18 – 2019-02-21 (×4): 20 mg via INTRAVENOUS
  Filled 2019-02-18 (×4): qty 50

## 2019-02-18 MED ORDER — ATORVASTATIN CALCIUM 20 MG PO TABS
40.0000 mg | ORAL_TABLET | Freq: Every day | ORAL | Status: DC
  Administered 2019-02-19 – 2019-02-21 (×3): 40 mg via ORAL
  Filled 2019-02-18 (×3): qty 2

## 2019-02-18 MED ORDER — ORAL CARE MOUTH RINSE
15.0000 mL | OROMUCOSAL | Status: DC
  Administered 2019-02-19 – 2019-02-23 (×39): 15 mL via OROMUCOSAL

## 2019-02-18 MED ORDER — SODIUM CHLORIDE 0.9 % IV SOLN
2.0000 g | Freq: Once | INTRAVENOUS | Status: AC
  Administered 2019-02-18: 2 g via INTRAVENOUS
  Filled 2019-02-18: qty 2

## 2019-02-18 MED ORDER — HEPARIN SODIUM (PORCINE) 5000 UNIT/ML IJ SOLN
5000.0000 [IU] | Freq: Three times a day (TID) | INTRAMUSCULAR | Status: DC
  Administered 2019-02-18 – 2019-02-23 (×13): 5000 [IU] via SUBCUTANEOUS
  Filled 2019-02-18 (×14): qty 1

## 2019-02-18 MED ORDER — MIDAZOLAM HCL 2 MG/2ML IJ SOLN
4.0000 mg | Freq: Once | INTRAMUSCULAR | Status: AC
  Administered 2019-02-18: 4 mg via INTRAVENOUS

## 2019-02-18 MED ORDER — FENTANYL CITRATE (PF) 100 MCG/2ML IJ SOLN
INTRAMUSCULAR | Status: AC
  Filled 2019-02-18: qty 2

## 2019-02-18 MED ORDER — DEXTROSE 50 % IV SOLN
INTRAVENOUS | Status: AC
  Administered 2019-02-18: 50 mL via INTRAVENOUS
  Filled 2019-02-18: qty 50

## 2019-02-18 MED ORDER — SODIUM BICARBONATE-DEXTROSE 150-5 MEQ/L-% IV SOLN
150.0000 meq | INTRAVENOUS | Status: DC
  Administered 2019-02-18: 150 meq via INTRAVENOUS
  Filled 2019-02-18 (×2): qty 1000

## 2019-02-18 MED ORDER — MIDAZOLAM HCL 2 MG/2ML IJ SOLN
INTRAMUSCULAR | Status: AC
  Filled 2019-02-18: qty 4

## 2019-02-18 MED ORDER — SODIUM CHLORIDE 0.9 % IV BOLUS
1000.0000 mL | Freq: Once | INTRAVENOUS | Status: AC
  Administered 2019-02-18: 1000 mL via INTRAVENOUS

## 2019-02-18 MED ORDER — INSULIN ASPART 100 UNIT/ML ~~LOC~~ SOLN
10.0000 [IU] | Freq: Once | SUBCUTANEOUS | Status: AC
  Administered 2019-02-18: 17:00:00 100 [IU] via INTRAVENOUS

## 2019-02-18 MED ORDER — ALBUTEROL SULFATE (2.5 MG/3ML) 0.083% IN NEBU
10.0000 mg | INHALATION_SOLUTION | Freq: Once | RESPIRATORY_TRACT | Status: AC
  Administered 2019-02-18: 10 mg via RESPIRATORY_TRACT
  Filled 2019-02-18: qty 12

## 2019-02-18 MED ORDER — CHLORHEXIDINE GLUCONATE 0.12% ORAL RINSE (MEDLINE KIT)
15.0000 mL | Freq: Two times a day (BID) | OROMUCOSAL | Status: DC
  Administered 2019-02-19 – 2019-02-23 (×9): 15 mL via OROMUCOSAL

## 2019-02-18 MED ORDER — THIAMINE HCL 100 MG/ML IJ SOLN
100.0000 mg | Freq: Every day | INTRAMUSCULAR | Status: DC
  Administered 2019-02-22: 100 mg via INTRAVENOUS
  Filled 2019-02-18 (×2): qty 2

## 2019-02-18 MED ORDER — DEXTROSE 50 % IV SOLN
1.0000 | Freq: Once | INTRAVENOUS | Status: AC
  Administered 2019-02-18: 50 mL via INTRAVENOUS
  Filled 2019-02-18: qty 50

## 2019-02-18 MED ORDER — FENTANYL CITRATE (PF) 100 MCG/2ML IJ SOLN
25.0000 ug | INTRAMUSCULAR | Status: DC | PRN
  Administered 2019-02-23: 100 ug via INTRAVENOUS
  Filled 2019-02-18: qty 2

## 2019-02-18 MED ORDER — MIDAZOLAM 50MG/50ML (1MG/ML) PREMIX INFUSION
0.5000 mg/h | INTRAVENOUS | Status: DC
  Filled 2019-02-18: qty 50

## 2019-02-18 MED ORDER — DEXTROSE 50 % IV SOLN
1.0000 | Freq: Once | INTRAVENOUS | Status: AC
  Administered 2019-02-18: 17:00:00 50 mL via INTRAVENOUS

## 2019-02-18 MED ORDER — CALCIUM GLUCONATE 10 % IV SOLN
INTRAVENOUS | Status: AC
  Administered 2019-02-18: 4.65 meq
  Filled 2019-02-18: qty 10

## 2019-02-18 NOTE — Consult Note (Signed)
PHARMACY -  BRIEF ANTIBIOTIC NOTE   Pharmacy has received consult(s) for Cefepime and Vancomycin from an ED provider.  The patient's profile has been reviewed for ht/wt/allergies/indication/available labs.    One time order(s) placed for Cefepime 2g x1 dose and Vancomycin 2 g x1 dose.   Further antibiotics/pharmacy consults should be ordered by admitting physician if indicated.                       Thank you, Rowland Lathe 02/11/2019  7:02 PM

## 2019-02-18 NOTE — Consult Note (Signed)
[facility]  CONSULT NOTE    Date: 02/20/2019                  Patient Name:  David Blackburn  MRN: [id number]  DOB: 06/29/1950  Age / Sex: 68 y.o., male         PCP: Dorcas CarrowJohnson, Megan P, DO                 Service Requesting Consult: Dr. Marisa SeverinSiadecki                 Reason for Consult: Acute renal failure            History of Present Illness: David Blackburn admitted after being found unresponsive. He was drinking heavily with his friends the night before. He was found this afternoon on the ground next to his car. Intubated for airway protection.   Nephrology consulted for acute renal failure with hyperkalemia.    Medications: Outpatient medications: (Not in a hospital admission)   Current medications: Current Facility-Administered Medications  Medication Dose Route Frequency Provider Last Rate Last Dose  . atorvastatin (LIPITOR) tablet 40 mg  40 mg Oral Daily Ouma, Hubbard HartshornElizabeth Achieng, NP      . fentaNYL 2500mcg in NS 250mL (6710mcg/ml) infusion-PREMIX  0-400 mcg/hr Intravenous Continuous Dionne BucySiadecki, Sebastian, MD 7.5 mL/hr at 02/03/2019 1841 75 mcg/hr at 02/09/2019 1841  . heparin injection 5,000 Units  5,000 Units Subcutaneous Q8H Ouma, Hubbard HartshornElizabeth Achieng, NP      . midazolam (VERSED) 50 mg/50 mL (1 mg/mL) premix infusion  0.5 mg/hr Intravenous Continuous Dionne BucySiadecki, Sebastian, MD      . norepinephrine (LEVOPHED) 4mg  in 250mL premix infusion  2-10 mcg/min Intravenous Titrated Dionne BucySiadecki, Sebastian, MD 15 mL/hr at 02/08/2019 1941 4 mcg/min at 02/04/2019 1941  . sodium bicarbonate 150 mEq in dextrose 5% 1000 mL infusion  150 mEq Intravenous Continuous Dionne BucySiadecki, Sebastian, MD 75 mL/hr at 02/13/2019 1900 150 mEq at 02/22/2019 1900  . vancomycin (VANCOCIN) 2,000 mg in sodium chloride 0.9 % 500 mL IVPB  2,000 mg Intravenous Once Katha CabalDuncan, Asajah R, RPH 250 mL/hr at 02/20/2019 1956 2,000 mg at 02/10/2019 1956   Current Outpatient Medications  Medication Sig Dispense Refill  .  atorvastatin (LIPITOR) 40 MG tablet Take 1 tablet (40 mg total) by mouth daily. 90 tablet 1  . cholecalciferol (VITAMIN D3) 25 MCG (1000 UT) tablet Take 1,000 Units by mouth daily.    . folic acid (FOLVITE) 400 MCG tablet Take 400 mcg by mouth daily.    . Inositol Niacinate (NIACIN FLUSH FREE) 500 MG CAPS Take 500 mg by mouth daily.    Marland Kitchen. losartan (COZAAR) 50 MG tablet Take 1 tablet (50 mg total) by mouth daily. (Patient taking differently: Take 50 mg by mouth at bedtime. ) 90 tablet 1  . metFORMIN (GLUCOPHAGE-XR) 500 MG 24 hr tablet TAKE 1 TABLET BY MOUTH ONCE DAILY WITH BREAKFAST (Patient taking differently: Take 500 mg by mouth daily with breakfast. ) 90 tablet 1  . Multiple Vitamin (MULTIVITAMIN) tablet Take 1 tablet by mouth daily.    . naproxen (NAPROSYN) 500 MG tablet TAKE 1 TABLET BY MOUTH TWICE DAILY WITH MEALS (Patient taking differently: Take 500 mg by mouth 2 (two) times daily with a meal. ) 60 tablet 6  . QUEtiapine (SEROQUEL XR) 50 MG TB24 24 hr tablet Take 2 tablets (100 mg total) by mouth at bedtime. 180 tablet 1      Allergies: No Known Allergies  Past Medical History: Past Medical History:  Diagnosis Date  . Alcohol abuse 01/30/2017  . Dental abscess 08/29/2014  . Diabetes mellitus without complication ([place])   . Hypertension   . Psoriasis   . Sciatica   . Substance induced mood disorder ([place]) 01/30/2017     Past Surgical History: Past Surgical History:  Procedure Laterality Date  . TONSILLECTOMY  1961  . VASECTOMY  1980     Family History: Family History  Problem Relation Age of Onset  . Lung cancer Mother   . Hypertension Mother   . Cancer Brother        Brain  . Stroke Maternal Grandmother   . Heart disease Maternal Grandfather   . Cancer Paternal Grandfather        Stomach     Social History: Social History   Socioeconomic History  . Marital status: Single    Spouse name: Not on file  . Number of children: Not on file  . Years of education:  Not on file  . Highest education level: Not on file  Occupational History  . Not on file  Social Needs  . Financial resource strain: Not on file  . Food insecurity    Worry: Not on file    Inability: Not on file  . Transportation needs    Medical: Not on file    Non-medical: Not on file  Tobacco Use  . Smoking status: Former Smoker    Packs/day: 1.00    Years: 40.00    Pack years: 40.00    Types: Cigars    Quit date: 2017    Years since quitting: 3.8  . Smokeless tobacco: Former Systems developer  . Tobacco comment: occassionally a cigar  Substance and Sexual Activity  . Alcohol use: Yes    Alcohol/week: 21.0 standard drinks    Types: 21 Glasses of wine per week    Comment: pt states he has not have any drink for about 2 weeks now  . Drug use: Not Currently    Types: Marijuana    Comment: Occassionally  . Sexual activity: Not on file  Lifestyle  . Physical activity    Days per week: Not on file    Minutes per session: Not on file  . Stress: Not on file  Relationships  . Social Herbalist on phone: Not on file    Gets together: Not on file    Attends religious service: Not on file    Active member of club or organization: Not on file    Attends meetings of clubs or organizations: Not on file    Relationship status: Not on file  . Intimate partner violence    Fear of current or ex partner: Not on file    Emotionally abused: Not on file    Physically abused: Not on file    Forced sexual activity: Not on file  Other Topics Concern  . Not on file  Social History Narrative  . Not on file     Review of Systems: Review of Systems  Unable to perform ROS: Critical illness    Vital Signs: Blood pressure 122/79, pulse 88, temperature 98.8 F (37.1 C), resp. rate (!) 27, height 5\' 4"  (1.626 m), weight 88 kg, SpO2 100 %.  Weight trends: Filed Weights   02/27/2019 1600  Weight: 88 kg    Physical Exam: General: Critically ill  Head: ETT  Eyes: Anicteri  Neck:  trachea midline  Lungs:  Clear to  auscultation, requiring mechanical ventilation AC FiO2 60%  Heart: Regular rate and rhythm  Abdomen:  Soft  Extremities: no peripheral edema.  Neurologic: Intubated, sedate  Skin: No lesions  Access: none     Lab results: Basic Metabolic Panel: Recent Labs  Lab 02/06/2019 1540  NA 140  K >7.5*  CL 105  CO2 21*  GLUCOSE 146*  BUN 36*  CREATININE 3.11*  CALCIUM 8.6*    Liver Function Tests: Recent Labs  Lab 02/13/2019 1540  AST 108*  ALT 63*  ALKPHOS 81  BILITOT 0.9  PROT 7.8  ALBUMIN 4.2   Recent Labs  Lab 02/02/2019 1954  LIPASE 51   No results for input(s): AMMONIA in the last 168 hours.  CBC: Recent Labs  Lab 02/19/2019 1540  WBC 14.7*  NEUTROABS 12.1*  HGB 14.8  HCT 47.0  MCV 93.1  PLT 375    Cardiac Enzymes: Recent Labs  Lab 02/22/2019 1540  CKTOTAL 7,081*    BNP: Invalid input(s): POCBNP  CBG: Recent Labs  Lab 02/04/2019 1539  GLUCAP 125*    Microbiology: Results for orders placed or performed during the hospital encounter of 02/03/2019  SARS Coronavirus 2 by RT PCR (hospital order, performed in [facility] hospital lab) Nasopharyngeal Urine, Catheterized     Status: None   Collection Time: 02/06/2019  3:40 PM   Specimen: Urine, Catheterized; Nasopharyngeal  Result Value Ref Range Status   SARS Coronavirus 2 NEGATIVE NEGATIVE Final    Comment: (NOTE) If result is NEGATIVE SARS-CoV-2 target nucleic acids are NOT DETECTED. The SARS-CoV-2 RNA is generally detectable in upper and lower  respiratory specimens during the acute phase of infection. The lowest  concentration of SARS-CoV-2 viral copies this assay can detect is 250  copies / mL. A negative result does not preclude SARS-CoV-2 infection  and should not be used as the sole basis for treatment or other  patient management decisions.  A negative result may occur with  improper specimen collection / handling, submission of specimen other  than  nasopharyngeal swab, presence of viral mutation(s) within the  areas targeted by this assay, and inadequate number of viral copies  (<250 copies / mL). A negative result must be combined with clinical  observations, patient history, and epidemiological information. If result is POSITIVE SARS-CoV-2 target nucleic acids are DETECTED. The SARS-CoV-2 RNA is generally detectable in upper and lower  respiratory specimens dur ing the acute phase of infection.  Positive  results are indicative of active infection with SARS-CoV-2.  Clinical  correlation with patient history and other diagnostic information is  necessary to determine patient infection status.  Positive results do  not rule out bacterial infection or co-infection with other viruses. If result is PRESUMPTIVE POSTIVE SARS-CoV-2 nucleic acids MAY BE PRESENT.   A presumptive positive result was obtained on the submitted specimen  and confirmed on repeat testing.  While 2019 novel coronavirus  (SARS-CoV-2) nucleic acids may be present in the submitted sample  additional confirmatory testing may be necessary for epidemiological  and / or clinical management purposes  to differentiate between  SARS-CoV-2 and other Sarbecovirus currently known to infect humans.  If clinically indicated additional testing with an alternate test  methodology [phone]) is advised. The SARS-CoV-2 RNA is generally  detectable in upper and lower respiratory sp ecimens during the acute  phase of infection. The expected result is Negative. Fact Sheet for Patients:  BoilerBrush.com.cy Fact Sheet for Healthcare Providers: https://pope.com/ This test is not yet approved or  cleared by the [place] and has been authorized for detection and/or diagnosis of SARS-CoV-2 by FDA under an Emergency Use Authorization (EUA).  This EUA will remain in effect (meaning this test can be used) for the duration of  the COVID-19 declaration under Section 564(b)(1) of the Act, 21 U.S.C. section 360bbb-3(b)(1), unless the authorization is terminated or revoked sooner. Performed at [facility], [address] [place], [place] [address]     Coagulation Studies: Recent Labs    03-02-19 1540  LABPROT 15.0  INR 1.2    Urinalysis: Recent Labs    2019/03/02 1540  COLORURINE YELLOW*  LABSPEC 1.008  PHURINE 5.0  GLUCOSEU NEGATIVE  HGBUR SMALL*  BILIRUBINUR NEGATIVE  KETONESUR NEGATIVE  PROTEINUR 30*  NITRITE NEGATIVE  LEUKOCYTESUR NEGATIVE      Imaging: Ct Head Wo Contrast  Result Date: 03/02/2019 CLINICAL DATA:  Altered LOC EXAM: CT HEAD WITHOUT CONTRAST TECHNIQUE: Contiguous axial images were obtained from the base of the skull through the vertex without intravenous contrast. COMPARISON:  None. FINDINGS: Brain: No acute territorial infarction, hemorrhage, or intracranial mass. Moderate atrophy. Moderate hypodensity in the white matter, consistent with chronic small vessel ischemic change. Prominent ventricles felt secondary to atrophy. Vascular: No hyperdense vessels.  Carotid vascular calcification. Skull: Normal. Negative for fracture or focal lesion. Sinuses/Orbits: No acute finding. Other: None IMPRESSION: 1. No CT evidence for acute intracranial abnormality. 2. Atrophy and small vessel ischemic changes of the white matter. Electronically Signed   By: Jasmine Pang M.D.   On: 03-02-2019 16:34   Dg Chest Portable 1 View  Result Date: 2019-03-02 CLINICAL DATA:  Status post intubation and NG tube placement. EXAM: PORTABLE CHEST 1 VIEW COMPARISON:  None. FINDINGS: Endotracheal tube is in place with its tip approximately 2.4 cm above the carina. NG tube tip is in the stomach in good position. Elevation of the right hemidiaphragm noted. Right basilar atelectasis. The lungs are otherwise clear. No pneumothorax or pleural effusion. Heart size is normal. IMPRESSION: ETT and NG tube in good  position. Subsegmental atelectasis right lung base. Electronically Signed   By: Drusilla Kanner M.D.   On: Mar 02, 2019 16:30      Assessment & Plan: Mr. Rawleigh Rode Blackburn is a 68 y.o. white male with diabetes mellitus type Blackburn, hypertension, alcohol abuse, substance abuse, Bipolar disorder who was admitted to [facility] on 03-02-19 for unresponsive  1. Acute Renal Failure: basline creatinine of 1.14, normal GFR on 11/18/18.  2. Hyperkalemia 3. Acidosis 4. Acute respiratory failure 5. Rhabdomyolysis 6. Sepsis  - IV bicarb gtt - recheck serum electrolytes - if no improvement in urine output or hyperkalemia, will proceed with renal replacement therapy.     LOS: 0 Alda Gaultney 11-04-208:28 PM

## 2019-02-18 NOTE — ED Notes (Signed)
1550: resp at bedside 1552 3m etomidate pushed IV via verbal order MD sidecki by mMariana Kaufman 1552: 1257msucc pushed IV via verbal order MD siadecki by man,rn 1553: 7.5 mm ET tube in place, positive color change, breath sounds present bilaterally, 27 at the lip 1554: 4 mg versed verbal order for IV push

## 2019-02-18 NOTE — ED Notes (Signed)
Date and time results received: 02/02/2019 1728 (use smartphrase ".now" to insert current time)  Test: K Critical Value: >7.5  Name of Provider Notified: Siadecki  Orders Received? Or Actions Taken?: Orders Received - See Orders for details

## 2019-02-18 NOTE — Progress Notes (Signed)
David Blackburn [phone] called about patient. Staff still attempting to contact son but no valid number present. David Blackburn did not know sons name or number.

## 2019-02-18 NOTE — Consult Note (Signed)
Name: David Blackburn MRN: [id number] DOB: December 07, 1950    ADMISSION DATE:  01/30/2019 CONSULTATION DATE: 02/12/2019  REFERRING MD : Rufina Falco, NP  CHIEF COMPLAINT: Unresponsiveness   BRIEF PATIENT DESCRIPTION:  68 yo male with hx of ETOH abuse admitted with acute encephalopathy, acute renal failure with hyperkalemia, and acute respiratory failure secondary to severe metabolic acidosis and possible aspiration pneumonia requiring mechanical intubation   SIGNIFICANT EVENTS/STUDIES:  10/23: Pt admitted to ICU mechanically intubated  10/23: CT Head revealed no CT evidence for acute intracranial abnormality. Atrophy and small vessel ischemic changes of the white matter  HISTORY OF PRESENT ILLNESS:   This is a 68 yo male with PMH of Substance Induced Mood Disorder, Sciatica, Psoriasis, HTN, Type Blackburn Diabetes Mellitus, Dental Abscess, and Alcohol Abuse.  He presented to [facility] ER via EMS on 10/23 after being found unresponsivene.  Per ER notes it was reported the pt drank alcohol with his friend the night of 10/22 and later on during the night he was dropped off at another friends house to "sleep off" the intoxication.  However, he was found the morning of 10/23 laying on the ground near a vehicle unresponsive prompting EMS notification.  Upon EMS arrival pt remained unresponsive with sbp in the 80's, he received narcan x3 doses en route to the ER without improvement in mentation. EMS EKG revealed possible small ST elevation in the inferior leads, no reciprocal changes, and no ischemic findings. Upon arrival to the ER pts O2 sats were 95% on 4L, however he remained unresponsive with snoring respirations requiring mechanical intubation.  CT Head negative for acute intracranial abnormality.  Lab results revealed K+ >7.5, CO2 21, glucose 146, BUN 36, creatinine 3.11, AST 108, ALT 63, troponin 501, lactic acid 3.1, wbc 14.7, acetaminophen level <17, salicylate level <4.9, toxicology positive for benzo's,  UA negative for UTI, and abg ph 7.19/pCO2 39/pO2 178/bicarb 14.9. Repeat EKG revealed slight T wave elevation and normal QRS consistent with hyperkalemia.  He received 10 units iv novolog, 1 amp D50W, and sodium bicarb gtt initiated.  COVID-19 negative, however CXR concerning for atelectasis of right lung.  Pt received cefepime, vancomycin, and 2L NS bolus.  He was subsequently admitted to ICU per hospitalist team for additional workup and treatment.    PAST MEDICAL HISTORY :   has a past medical history of Alcohol abuse (01/30/2017), Dental abscess (08/29/2014), Diabetes mellitus without complication ([place]), Hypertension, Psoriasis, Sciatica, and Substance induced mood disorder ([place]) (01/30/2017).  has a past surgical history that includes Tonsillectomy (1961) and Vasectomy (1980). Prior to Admission medications   Medication Sig Start Date End Date Taking? Authorizing Provider  atorvastatin (LIPITOR) 40 MG tablet Take 1 tablet (40 mg total) by mouth daily. 11/18/18  Yes Johnson, Megan P, DO  cholecalciferol (VITAMIN D3) 25 MCG (1000 UT) tablet Take 1,000 Units by mouth daily.   Yes [provider]  folic acid (FOLVITE) 449 MCG tablet Take 400 mcg by mouth daily.   Yes [provider]  Inositol Niacinate (NIACIN FLUSH FREE) 500 MG CAPS Take 500 mg by mouth daily.   Yes [provider]  losartan (COZAAR) 50 MG tablet Take 1 tablet (50 mg total) by mouth daily. Patient taking differently: Take 50 mg by mouth at bedtime.  11/18/18  Yes Johnson, Megan P, DO  metFORMIN (GLUCOPHAGE-XR) 500 MG 24 hr tablet TAKE 1 TABLET BY MOUTH ONCE DAILY WITH BREAKFAST Patient taking differently: Take 500 mg by mouth daily with breakfast.  11/18/18  Yes Johnson, Megan P, DO  Multiple Vitamin (MULTIVITAMIN) tablet Take 1 tablet by mouth daily.   Yes [provider]  naproxen (NAPROSYN) 500 MG tablet TAKE 1 TABLET BY MOUTH TWICE DAILY WITH MEALS Patient taking differently: Take 500 mg by mouth 2  (two) times daily with a meal.  12/28/18  Yes Johnson, Megan P, DO  QUEtiapine (SEROQUEL XR) 50 MG TB24 24 hr tablet Take 2 tablets (100 mg total) by mouth at bedtime. 11/18/18  Yes Johnson, Megan P, DO   No Known Allergies  FAMILY HISTORY:  family history includes Cancer in his brother and paternal grandfather; Heart disease in his maternal grandfather; Hypertension in his mother; Lung cancer in his mother; Stroke in his maternal grandmother. SOCIAL HISTORY:  reports that he quit smoking about 3 years ago. His smoking use included cigars. He has a 40.00 pack-year smoking history. He has quit using smokeless tobacco. He reports current alcohol use of about 21.0 standard drinks of alcohol per week. He reports previous drug use. Drug: Marijuana.  REVIEW OF SYSTEMS:   Unable to assess pt intubated   SUBJECTIVE:  Unable to assess pt intubated   VITAL SIGNS: Temp:  [97.1 F (36.2 C)-98.9 F (37.2 C)] 98.7 F (37.1 C) (10/23 1930) Pulse Rate:  [72-91] 85 (10/23 1930) Resp:  [16-24] 21 (10/23 1930) BP: (56-123)/(45-89) 123/87 (10/23 1930) SpO2:  [94 %-100 %] 100 % (10/23 1930) FiO2 (%):  [60 %] 60 % (10/23 1951) Weight:  [88 kg] 88 kg (10/23 1600)  PHYSICAL EXAMINATION: General: acutely ill appearing male, NAD mechanically intubated  Neuro: sedated, not following commands, bilateral pupils 2 mm sluggish   HEENT: supple, no JVD  Cardiovascular: nsr, rrr, no R/G  Lungs: faint rhonchi throughout, even, non labored  Abdomen: +BS x4, soft, non distended  Musculoskeletal: normal bulk and tone, no edema  Skin: scattered psoriasis rash on back and bilateral lower extremities   Recent Labs  Lab 02/08/2019 1540  NA 140  K >7.5*  CL 105  CO2 21*  BUN 36*  CREATININE 3.11*  GLUCOSE 146*   Recent Labs  Lab 02/06/2019 1540  HGB 14.8  HCT 47.0  WBC 14.7*  PLT 375   Ct Head Wo Contrast  Result Date: 02/24/2019 CLINICAL DATA:  Altered LOC EXAM: CT HEAD WITHOUT CONTRAST TECHNIQUE:  Contiguous axial images were obtained from the base of the skull through the vertex without intravenous contrast. COMPARISON:  None. FINDINGS: Brain: No acute territorial infarction, hemorrhage, or intracranial mass. Moderate atrophy. Moderate hypodensity in the white matter, consistent with chronic small vessel ischemic change. Prominent ventricles felt secondary to atrophy. Vascular: No hyperdense vessels.  Carotid vascular calcification. Skull: Normal. Negative for fracture or focal lesion. Sinuses/Orbits: No acute finding. Other: None IMPRESSION: 1. No CT evidence for acute intracranial abnormality. 2. Atrophy and small vessel ischemic changes of the white matter. Electronically Signed   By: Donavan Foil M.D.   On: 02/19/2019 16:34   Dg Chest Portable 1 View  Result Date: 02/05/2019 CLINICAL DATA:  Status post intubation and NG tube placement. EXAM: PORTABLE CHEST 1 VIEW COMPARISON:  None. FINDINGS: Endotracheal tube is in place with its tip approximately 2.4 cm above the carina. NG tube tip is in the stomach in good position. Elevation of the right hemidiaphragm noted. Right basilar atelectasis. The lungs are otherwise clear. No pneumothorax or pleural effusion. Heart size is normal. IMPRESSION: ETT and NG tube in good position. Subsegmental atelectasis right lung base. Electronically Signed  By: Inge Rise M.D.   On: 02/14/2019 16:30    ASSESSMENT / PLAN:  Acute respiratory failure secondary to severe metabolic acidosis and possible aspiration  Mechanical intubation  Full vent support for now-vent setting reviewed and established  SBT once all parameters met  VAP bundle implemented  Prn bronchodilator therapy   Elevated troponin secondary to demand ischemia vs. NSTEMI  Hx: HTN Continuous telemetry monitoring  Prn levophed gtt to maintain map >65 Hold outpatient antihypertensives, continue outpatient atorvastatin Trend troponin's   Acute renal failure with hyperkalemia  Lactic  acidosis  Trend BMP  Replace electrolytes as indicated  Monitor UOP  Avoid nephrotoxic medications  Renal US pending  Nephrology consulted appreciate input-continue sodium bicarb gtt _0  ml/hr if acute renal failure does not improve will need CRRT   Leukocytosis concerning for possible aspiration pneumonia  Trend WBC and monitor fever curve  Trend PCT  Follow cultures  Will discontinue cefepime and start unasyn for aspiration; if MRSA PCR negative will discontinue vancomycin   Type Blackburn Diabetes Mellitus  CBG's q4hrs  SSI   Acute encephalopathy etiology unclear  Mechanical intubation pain/discomfort  Hx: ETOH abuse and Substance induced mood disorder  Maintain RASS goal 0 to -1 Prn versed and fentanyl to maintain RASS goal  MR Brain pending if mentation does not improve will consult neurology  Will initiate CIWA protocol WUA daily   SUP px: iv pepcid VTE px: subq heparin   -Attempted to contact pts son Jaquann Guarisco via telephone to inform him of pts hospitalization using the only phone number listed in epic, however went to voicemail.  I did not leave a voicemail message, I am concerned this may be an incorrect number based on voicemail message.  There are no other contacts listed in epic.  Marda Stalker, [place] Pager [phone] (please enter 7 digits) PCCM Consult Pager [phone] (please enter 7 digits)

## 2019-02-18 NOTE — ED Notes (Signed)
Hospitalist at bedside 

## 2019-02-18 NOTE — ED Notes (Signed)
Repeat EKG performed.   

## 2019-02-18 NOTE — ED Notes (Signed)
Pads placed on pt at this time 

## 2019-02-18 NOTE — ED Notes (Signed)
Date and time results received: 02-28-19 1715 (use smartphrase ".now" to insert current time)  Test: Troponin I Critical Value: 501  Name of Provider Notified: Siadecki MD  Orders Received? Or Actions Taken?:

## 2019-02-18 NOTE — ED Notes (Signed)
Pt suctioned at this time.

## 2019-02-18 NOTE — ED Notes (Addendum)
Called ICU to inform pt was on the way, was told by charge nurse that they are getting another pt and will not take David Blackburn until after the other pt has been admitted. ICU Charge nurse said to check status of other pt and call back after they were admitted

## 2019-02-18 NOTE — H&P (Addendum)
[facility] NAME: David Blackburn    MR#:  [id number]  DATE OF BIRTH:  December 08, 1950  DATE OF ADMISSION:  02/05/2019  PRIMARY CARE PHYSICIAN: Valerie Roys, DO   REQUESTING/REFERRING PHYSICIAN: Arta Silence, MD  CHIEF COMPLAINT:   Chief Complaint  Patient presents with   unresponsive    HISTORY OF PRESENT ILLNESS:  68 y.o. male with pertinent past medical history of EtOH abuse, diabetes mellitus, hypertension, psoriasis, sciatica, mood disorder with suicidal ideation and tobacco abuse presenting to the ED with unresponsiveness.  Per ED reports, patient was found lying next to his car this afternoon unresponsive. It is unclear how long he has been on the ground since the last time he was seen normal. Last known normal was last night according to friends. He apparently had been drinking the previous night with friends and was dropped off at unknown location by friends "to sleep it off". On EMS arrival, patient was noted to be hypotensive with SBP in the 80s, Narcan was given on route with no response.  On arrival to the ED, he was afebrile with blood pressure 59/45 mm Hg and pulse rate 158 beats/min. There were no focal neurological deficits; he was somnolent and unresponsive with snoring respirations and minimal gag reflex.  He does not respond to verbal or noxious stimuli.  Due to the inability to protect his airway, patient was intubated in the ED for airway protection.  Patient remained hypotensive therefore was started on Levophed drip to keep MAP greater than 65.  Bedside ultrasound exam in the ED showed evidence of decreased LVEF, but no pericardial effusion.  Patient labs in the ED revealed glucose 125, acetaminophen levels <10 ug/mL, ethanol  <10, potassium> 7.5, BUN 36, creatinine 3.11, AST 108, ALT 63, initial troponin 501, repeat 691, lactic acid 3.1, WBC 14.7, CK 7081, Covid 19 negative.  UDS positive for benzos however  he received benzo in the ED prior to intubation.  Patient also received calcium gluconate, insulin D50 and albuterol for hyperkalemia.  Patient also received 2 L of IV fluid bolus with continuous maintenance IV fluids.  A noncontrast CT head was obtained and showed no acute intracranial abnormality. Hospitalist ask to admit for further management.  PAST MEDICAL HISTORY:   Past Medical History:  Diagnosis Date   Alcohol abuse 01/30/2017   Dental abscess 08/29/2014   Diabetes mellitus without complication (HCC)    Hypertension    Psoriasis    Sciatica    Substance induced mood disorder ([place]) 01/30/2017    PAST SURGICAL HISTORY:   Past Surgical History:  Procedure Laterality Date   TONSILLECTOMY  1961   VASECTOMY  1980    SOCIAL HISTORY:   Social History   Tobacco Use   Smoking status: Former Smoker    Packs/day: 1.00    Years: 40.00    Pack years: 40.00    Types: Cigars    Quit date: 2017    Years since quitting: 3.8   Smokeless tobacco: Former Systems developer   Tobacco comment: occassionally a cigar  Substance Use Topics   Alcohol use: Yes    Alcohol/week: 21.0 standard drinks    Types: 21 Glasses of wine per week    Comment: pt states he has not have any drink for about 2 weeks now    FAMILY HISTORY:   Family History  Problem Relation Age of Onset   Lung cancer Mother    Hypertension Mother  Cancer Brother        Brain   Stroke Maternal Grandmother    Heart disease Maternal Grandfather    Cancer Paternal Grandfather        Stomach    DRUG ALLERGIES:  No Known Allergies  REVIEW OF SYSTEMS:   Review of Systems  Unable to perform ROS: Intubated   MEDICATIONS AT HOME:   Prior to Admission medications   Medication Sig Start Date End Date Taking? Authorizing Provider  atorvastatin (LIPITOR) 40 MG tablet Take 1 tablet (40 mg total) by mouth daily. 11/18/18  Yes Johnson, Megan P, DO  cholecalciferol (VITAMIN D3) 25 MCG (1000 UT) tablet Take 1,000  Units by mouth daily.   Yes [provider]  folic acid (FOLVITE) 400 MCG tablet Take 400 mcg by mouth daily.   Yes [provider]  Inositol Niacinate (NIACIN FLUSH FREE) 500 MG CAPS Take 500 mg by mouth daily.   Yes [provider]  losartan (COZAAR) 50 MG tablet Take 1 tablet (50 mg total) by mouth daily. Patient taking differently: Take 50 mg by mouth at bedtime.  11/18/18  Yes Johnson, Megan P, DO  metFORMIN (GLUCOPHAGE-XR) 500 MG 24 hr tablet TAKE 1 TABLET BY MOUTH ONCE DAILY WITH BREAKFAST Patient taking differently: Take 500 mg by mouth daily with breakfast.  11/18/18  Yes Johnson, Megan P, DO  Multiple Vitamin (MULTIVITAMIN) tablet Take 1 tablet by mouth daily.   Yes [provider]  naproxen (NAPROSYN) 500 MG tablet TAKE 1 TABLET BY MOUTH TWICE DAILY WITH MEALS Patient taking differently: Take 500 mg by mouth 2 (two) times daily with a meal.  12/28/18  Yes Johnson, Megan P, DO  QUEtiapine (SEROQUEL XR) 50 MG TB24 24 hr tablet Take 2 tablets (100 mg total) by mouth at bedtime. 11/18/18  Yes Johnson, Megan P, DO      VITAL SIGNS:  Blood pressure 122/79, pulse 88, temperature 98.8 F (37.1 C), resp. rate (!) 27, height  (1.626 m), weight 88 kg, SpO2 100 %.  PHYSICAL EXAMINATION:   Physical Exam  GENERAL:  68 y.o.-year-old patient lying in the bed intubated and sedated EYES: Pupils equal, round, reactive to light and accommodation. No scleral icterus. Extraocular muscles intact.  HEENT: Head atraumatic, normocephalic. Oropharynx: ET tube in place and nasopharynx clear.  NECK:  Supple, no jugular venous distention. No thyroid enlargement, no tenderness.  LUNGS: Normal breath sounds bilaterally, no wheezing, rales,rhonchi or crepitation. No use of accessory muscles of respiration.  CARDIOVASCULAR: S1, S2 normal. No murmurs, rubs, or gallops.  ABDOMEN: Soft, nontender, nondistended. Bowel sounds present. No organomegaly or mass.  EXTREMITIES: No  pedal edema, cyanosis, or clubbing.  NEUROLOGIC: Patient does not respond to verbal stimuli.  Does not respond to deep sternal rub.  Does not follow commands.  No verbalizations noted.  Cranial Nerves: Pupils right 2 mm, left 2 mm,and minimally reactive bilaterally III,IV,VI: doll's response present bilaterally.  V,VII: corneal reflex present bilaterally  VIII: patient does not respond to verbal stimuli IX,X: gag reflex present, XI: trapezius strength unable to test bilaterally XII: tongue strength unable to test Motor: Extremities flaccid throughout.  No spontaneous movement noted.  No purposeful movements noted. Sensory: Flickers to noxious stimuli in all extremity. Deep Tendon Reflexes:  Absent throughout. Plantars: upgoing bilaterally Cerebellar: Unable to perform PSYCHIATRIC: Unable to assess.  SKIN: No obvious rash, lesion, or ulcer.   DATA REVIEWED:  LABORATORY PANEL:   CBC Recent Labs  Lab 02/24/2019 1540  WBC 14.7*  HGB 14.8  HCT 47.0  PLT 375   ------------------------------------------------------------------------------------------------------------------  Chemistries  Recent Labs  Lab 01/30/2019 1540  NA 140  K >7.5*  CL 105  CO2 21*  GLUCOSE 146*  BUN 36*  CREATININE 3.11*  CALCIUM 8.6*  AST 108*  ALT 63*  ALKPHOS 81  BILITOT 0.9   ------------------------------------------------------------------------------------------------------------------  Cardiac Enzymes No results for input(s): TROPONINI in the last 168 hours. ------------------------------------------------------------------------------------------------------------------  RADIOLOGY:  Ct Head Wo Contrast  Result Date: 01/29/2019 CLINICAL DATA:  Altered LOC EXAM: CT HEAD WITHOUT CONTRAST TECHNIQUE: Contiguous axial images were obtained from the base of the skull through the vertex without intravenous contrast. COMPARISON:  None. FINDINGS: Brain: No acute territorial infarction,  hemorrhage, or intracranial mass. Moderate atrophy. Moderate hypodensity in the white matter, consistent with chronic small vessel ischemic change. Prominent ventricles felt secondary to atrophy. Vascular: No hyperdense vessels.  Carotid vascular calcification. Skull: Normal. Negative for fracture or focal lesion. Sinuses/Orbits: No acute finding. Other: None IMPRESSION: 1. No CT evidence for acute intracranial abnormality. 2. Atrophy and small vessel ischemic changes of the white matter. Electronically Signed   By: Jasmine Pang M.D.   On: 02/01/2019 16:34   Dg Chest Portable 1 View  Result Date: 02/12/2019 CLINICAL DATA:  Status post intubation and NG tube placement. EXAM: PORTABLE CHEST 1 VIEW COMPARISON:  None. FINDINGS: Endotracheal tube is in place with its tip approximately 2.4 cm above the carina. NG tube tip is in the stomach in good position. Elevation of the right hemidiaphragm noted. Right basilar atelectasis. The lungs are otherwise clear. No pneumothorax or pleural effusion. Heart size is normal. IMPRESSION: ETT and NG tube in good position. Subsegmental atelectasis right lung base. Electronically Signed   By: Drusilla Kanner M.D.   On: 02/04/2019 16:30    EKG:  EKG: Date: 02/14/2019 EKG Time: 1542 Rate: 111 Rhythm: Sinus tachycardia with PVCs QRS Axis: normal Intervals: Borderline QTc ST/T Wave abnormalities: normal Narrative Interpretation: no evidence of acute ischemia  IMPRESSION AND PLAN:   68 y.o. male  with pertinent past medical history of EtOH abuse, diabetes mellitus, hypertension, psoriasis, sciatica, mood disorder with suicidal ideation and tobacco abuse presenting to the ED with unresponsiveness.  1. Acute respiratory failure with hypoxia -patient found down for unknown period of time - Admit to ICU - On mechanical ventilation - CT head negative for acute intracranial abnormality - We will check follow-up MRI of the brain - Management per ICU team  2. Acute  rhabdomyolysis - Aggressive IV fluids hydration - Trend CK  3. Elevated troponin - likely demand ischemia with no dynamic EKG changes -Trend troponin - Check echocardiogram  4. Leukocytosis : WBC 14.7 with  lactic acidosis - Monitor fever curve - UA negative - CXR mental atelectasis in the right lung base concerns for aspiration pneumonia - Start Empiric abx Unasyn - Cultures pending - Trend WBC's and Procalcitonin  5. Acute kidney injury with hyperkalemia-likely complication of rhabdomyolysis - Hold nephrotoxins - Aggressive IV fluids hydration - Continue sodium bicarb gtt - Continue to monitor renal function and electrolytes - Nephrology following   6.  History of hypertension -now hypotensive in shock possibly hypovolemic - Continue Levophed gtt  7. Type 2 diabetes mellitus - SSI - Glycemic control per ICU protocol  8. EtOH Abusel -high risk for withdrawal ? Last Drink  02/17/19 - Check CMP, INR, Daily BMP+Mg - Banana Bag x1 (1L NS or D5W, 100mg  thiamine, 1mg  folate, 1amp/tab MVI, 3g  magnesium sulfate) - Daily Thiamine, Folate, MVI once tolerating PO - SW consult for cessation resources - PT/OT evaluation for mobility - CIWA +/- Standing Protocol  9. DVT prophylaxis - Heparin SubQ   All the records are reviewed and case discussed with ED provider. Management plans discussed with the patient, family and they are in agreement.  CODE STATUS: FULL  TOTAL TIME TAKING CARE OF THIS PATIENT: 50 minutes.    on 08/06/2018 at 8:14 PM   Webb SilversmithElizabeth Jaquelin Meaney, DNP, FNP-BC [facility] Nurse Practitioner Between 7am to 6pm - Pager [phone]- [phone]  After 6pm go to [email or web address] - Social research officer, governmentpassword EPAS [email or web address]  [facility]  Office  [phone]  CC: Primary care physician; Dorcas CarrowJohnson, Megan P, DO

## 2019-02-18 NOTE — ED Notes (Signed)
Second BMP sent for analysis

## 2019-02-18 NOTE — ED Notes (Signed)
Brother Paula Compton [phone]. Would like to

## 2019-02-18 NOTE — ED Triage Notes (Signed)
Pt presents via acems with c/o unresponsiveness. Pt has hx of alcohol abuse. pt was reported to be drinking with friend last night and dropped off at unknown location. Pt was found laying nect to car this morning unresponsive.  Last known normal was last night according to friends. systolic pressure of 80 upon ems arrival, 3 narcan given via acems in route with no response. pt 95% on 4L. Blood glucose 153. pt currently unreposinve with snoring respirations. 20G in right hand placed by ems.

## 2019-02-18 NOTE — ED Notes (Signed)
Date and time results received: 02-26-2019 1650 (use smartphrase ".now" to insert current time)  Test: Lactic Acid Critical Value: 3.1  Name of Provider Notified: Cherylann Banas, MD  Orders Received? Or Actions Taken?:

## 2019-02-18 NOTE — ED Notes (Signed)
Respiratory at bedside.

## 2019-02-18 NOTE — ED Notes (Signed)
Attempted to call floor for pt, per charge nurse they will call back. Will inform RN charge Sharyn Lull

## 2019-02-18 NOTE — ED Notes (Signed)
Pt transported to CT ?

## 2019-02-18 NOTE — ED Notes (Signed)
Date and time results received: 02/20/2019 1827 (use smartphrase ".now" to insert current time)  Test: troponin Critical Value: 691  Name of Provider Notified: Siadecki  Orders Received? Or Actions Taken?: Orders Received - See Orders for details

## 2019-02-18 NOTE — Progress Notes (Signed)
eLink Physician-Brief Progress Note Patient Name: David Blackburn DOB: 1950/09/03 MRN: [id number]   Date of Service  03-09-19  HPI/Events of Note  59 M ETOH, HTN, DM found down by the side of a car. Head CT negative. Tox positive for benzo which he had received in the ED. ETOH <7. K 7.5, creatinine 3.11 hyperkalemia protocol received and started on bicarb drip. CK 7K, Troponin 500-600. WBC 15, procal 0.63 started on broad spectrum antibiotics for possible aspiration. He remained unresponsive with severe metabolic acidosis and was intubated.  eICU Interventions   Respiratory failure, intubated for airway protection and compensation for metabolic acidosis  Hypotension improved with fluids  Unclear etiology of decreased level of consciousness, for repeat CT vs MRI  AKI with hyperkalemia, mild rhabdo, improved with fluids and now bicarb gtt  Elevated troponins in the background of AKI, trend     Intervention Category Major Interventions: Respiratory failure - evaluation and management;Other:;Acute renal failure - evaluation and management;Electrolyte abnormality - evaluation and management Evaluation Type: New Patient Evaluation  Judd Lien 2019-03-09, 9:43 PM

## 2019-02-18 NOTE — ED Notes (Signed)
MD Siadecki at bedside with [place]

## 2019-02-18 NOTE — ED Notes (Signed)
4 mg versed IV push by mac.rn

## 2019-02-18 NOTE — Consult Note (Signed)
[facility] NOTE  Pharmacy Consult for Vasopressor monitoring  Indication: Assist in monitoring of safety of peripherally administered vasopressors. Do not concentrate vasopressor infusions while administered via peripheral access. Convert infusions to standard doses and administration instructions when central line placed.   No Known Allergies  Patient Measurements: Height: 5\' 4"  (162.6 cm) Weight: 194 lb 0.1 oz (88 kg) IBW/kg (Calculated) : 59.2 Adjusted Body Weight: 88 kg   Vital Signs: Temp: 98.7 F (37.1 C) (10/23 1700) BP: 56/47 (10/23 1700) Pulse Rate: 81 (10/23 1546) Intake/Output from previous day: No intake/output data recorded. Intake/Output from this shift: No intake/output data recorded. Vent settings for last 24 hours: Vent Mode: AC FiO2 (%):  [60 %] 60 % Set Rate:  [20 bmp] 20 bmp Vt Set:  [500 mL] 500 mL PEEP:  [5 cmH20] 5 cmH20  Labs: Recent Labs    02/10/2019 1540  WBC 14.7*  HGB 14.8  HCT 47.0  PLT 375  INR 1.2   CrCl cannot be calculated (Patient's most recent lab result is older than the maximum 21 days allowed.).  Recent Labs    02/24/2019 1539  GLUCAP 125*    Microbiology: No results found for this or any previous visit (from the past 720 hour(s)).  Medications:  Levophed 4 mg/250 mL- titrated infusion   Assessment/Plan:  Will monitor that patient receives appropriate vasopressor concentration  Do not concentrate vasopressor infusions while administered via peripheral access.   Rowland Lathe 02/14/2019,5:13 PM

## 2019-02-18 NOTE — ED Provider Notes (Signed)
[facility] Emergency Department Provider Note ____________________________________________   First MD Initiated Contact with Patient 02/26/2019 1559     (approximate)  I have reviewed the triage vital signs and the nursing notes.   HISTORY  Chief Complaint unresponsive  Level 5 caveat: History of present illness limited due to unresponsiveness  HPI David Blackburn is a 68 y.o. male with PMH as noted below including alcohol abuse, diabetes, and hypertension who presents after he was found unresponsive.  Per EMS, the patient had apparently been drinking with a friend last night.  He was then dropped off some time last night or early in the morning at another friend's place to "sleep it off."  This afternoon, he was found outside on the ground next to his car.  He was found to be unresponsive.  The patient continues to be unresponsive and is unable to give history.  Past Medical History:  Diagnosis Date  . Alcohol abuse 01/30/2017  . Dental abscess 08/29/2014  . Diabetes mellitus without complication (HCC)   . Hypertension   . Psoriasis   . Sciatica   . Substance induced mood disorder (HCC) 01/30/2017    Patient Active Problem List   Diagnosis Date Noted  . BPH (benign prostatic hyperplasia) 09/23/2018  . Personal history of tobacco use, presenting hazards to health 04/13/2018  . Advanced directives, counseling/discussion 02/03/2018  . Bilateral hearing loss 02/03/2018  . Hyperlipidemia associated with type 2 diabetes mellitus (HCC) 09/01/2017  . Controlled type 2 diabetes with renal manifestation 08/31/2017  . Arthritis of hip 08/31/2017  . Bipolar 2 disorder (HCC) 01/30/2017  . 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 Psoriasis 05/04/2014    Past Surgical History:  Procedure Laterality Date  . TONSILLECTOMY  1961  . VASECTOMY  1980    Prior to Admission medications   Medication Sig Start Date End Date Taking? Authorizing Provider   atorvastatin (LIPITOR) 40 MG tablet Take 1 tablet (40 mg total) by mouth daily. 11/18/18  Yes Johnson, Megan P, DO  cholecalciferol (VITAMIN D3) 25 MCG (1000 UT) tablet Take 1,000 Units by mouth daily.   Yes [provider]  folic acid (FOLVITE) 400 MCG tablet Take 400 mcg by mouth daily.   Yes [provider]  Inositol Niacinate (NIACIN FLUSH FREE) 500 MG CAPS Take 500 mg by mouth daily.   Yes [provider]  losartan (COZAAR) 50 MG tablet Take 1 tablet (50 mg total) by mouth daily. Patient taking differently: Take 50 mg by mouth at bedtime.  11/18/18  Yes Johnson, Megan P, DO  metFORMIN (GLUCOPHAGE-XR) 500 MG 24 hr tablet TAKE 1 TABLET BY MOUTH ONCE DAILY WITH BREAKFAST Patient taking differently: Take 500 mg by mouth daily with breakfast.  11/18/18  Yes Johnson, Megan P, DO  Multiple Vitamin (MULTIVITAMIN) tablet Take 1 tablet by mouth daily.   Yes [provider]  naproxen (NAPROSYN) 500 MG tablet TAKE 1 TABLET BY MOUTH TWICE DAILY WITH MEALS Patient taking differently: Take 500 mg by mouth 2 (two) times daily with a meal.  12/28/18  Yes Johnson, Megan P, DO  QUEtiapine (SEROQUEL XR) 50 MG TB24 24 hr tablet Take 2 tablets (100 mg total) by mouth at bedtime. 11/18/18  Yes Johnson, Megan P, DO    Allergies Patient has no known allergies.  Family History  Problem Relation Age of Onset  . Lung cancer Mother   . Hypertension Mother   . Cancer Brother  Brain  . Stroke Maternal Grandmother   . Heart disease Maternal Grandfather   . Cancer Paternal Grandfather        Stomach    Social History Social History   Tobacco Use  . Smoking status: Former Smoker    Packs/day: 1.00    Years: 40.00    Pack years: 40.00    Types: Cigars    Quit date: 2017    Years since quitting: 3.8  . Smokeless tobacco: Former Neurosurgeon  . Tobacco comment: occassionally a cigar  Substance Use Topics  . Alcohol use: Yes    Alcohol/week: 21.0 standard drinks    Types: 21  Glasses of wine per week    Comment: pt states he has not have any drink for about 2 weeks now  . Drug use: Not Currently    Types: Marijuana    Comment: Occassionally    Review of Systems Level 5 caveat: Unable to obtain review of systems due to unresponsiveness    ____________________________________________   PHYSICAL EXAM:  VITAL SIGNS: ED Triage Vitals  Enc Vitals Group     BP 02/21/2019 1546 109/85     Pulse Rate 02/24/2019 1545 78     Resp 02/15/2019 1545 20     Temp 01/30/2019 1600 (!) 97.1 F (36.2 C)     Temp src --      SpO2 02/13/2019 1545 100 %     Weight 02/17/2019 1600 194 lb 0.1 oz (88 kg)     Height 01/29/2019 1600  (1.626 m)     Head Circumference --      Peak Flow --      Pain Score 02/08/2019 1543 Asleep     Pain Loc --      Pain Edu? --      Excl. in GC? --     Constitutional: Somnolent, unresponsive to deep painful stimuli. Eyes: Conjunctivae are normal.  EOMI.  PERRLA. Head: Atraumatic. Nose: No congestion/rhinnorhea. Mouth/Throat: Mucous membranes are dry.   Neck: Normal range of motion.  Cardiovascular: Borderline tachycardic, regular rhythm. Grossly normal heart sounds.  Good peripheral circulation. Respiratory: Snoring type respirations with coarse breath sounds bilaterally.  Minimal gag reflex. Gastrointestinal: Soft and nontender. No distention.  Genitourinary: No flank tenderness. Musculoskeletal: No lower extremity edema.  Extremities warm and well perfused.  Neurologic: Unable to assess due to unresponsiveness. Skin:  Skin is warm and dry. No rash noted. Psychiatric: Unresponsive.  ____________________________________________   LABS (all labs ordered are listed, but only abnormal results are displayed)  Labs Reviewed  GLUCOSE, CAPILLARY - Abnormal; Notable for the following components:      Result Value   Glucose-Capillary 125 (*)    All other components within normal limits  ACETAMINOPHEN LEVEL - Abnormal; Notable for the following  components:   Acetaminophen (Tylenol), Serum <10 (*)    All other components within normal limits  BASIC METABOLIC PANEL - Abnormal; Notable for the following components:   Potassium >7.5 (*)    CO2 21 (*)    Glucose, Bld 146 (*)    BUN 36 (*)    Creatinine, Ser 3.11 (*)    Calcium 8.6 (*)    GFR calc non Af Amer 20 (*)    GFR calc Af Amer 23 (*)    All other components within normal limits  HEPATIC FUNCTION PANEL - Abnormal; Notable for the following components:   AST 108 (*)    ALT 63 (*)    All other components  within normal limits  LACTIC ACID, PLASMA - Abnormal; Notable for the following components:   Lactic Acid, Venous 3.1 (*)    All other components within normal limits  LACTIC ACID, PLASMA - Abnormal; Notable for the following components:   Lactic Acid, Venous 3.2 (*)    All other components within normal limits  CBC WITH DIFFERENTIAL/PLATELET - Abnormal; Notable for the following components:   WBC 14.7 (*)    Neutro Abs 12.1 (*)    Monocytes Absolute 1.1 (*)    Abs Immature Granulocytes 0.23 (*)    All other components within normal limits  URINALYSIS, COMPLETE (UACMP) WITH MICROSCOPIC - Abnormal; Notable for the following components:   Color, Urine YELLOW (*)    APPearance CLEAR (*)    Hgb urine dipstick SMALL (*)    Protein, ur 30 (*)    Bacteria, UA RARE (*)    All other components within normal limits  URINE DRUG SCREEN, QUALITATIVE ([facility] ONLY) - Abnormal; Notable for the following components:   Benzodiazepine, Ur Scrn POSITIVE (*)    All other components within normal limits  BLOOD GAS, ARTERIAL - Abnormal; Notable for the following components:   pH, Arterial 7.19 (*)    pO2, Arterial 178 (*)    Bicarbonate 14.9 (*)    Acid-base deficit 12.6 (*)    All other components within normal limits  TROPONIN I (HIGH SENSITIVITY) - Abnormal; Notable for the following components:   Troponin I (High Sensitivity) 501 (*)    All other components within normal limits   TROPONIN I (HIGH SENSITIVITY) - Abnormal; Notable for the following components:   Troponin I (High Sensitivity) 691 (*)    All other components within normal limits  SARS CORONAVIRUS 2 BY RT PCR (HOSPITAL ORDER, [place] [facility])  SALICYLATE LEVEL  PROTIME-INR  ETHANOL  CK   ____________________________________________  EKG  ED ECG REPORT I, Arta Silence, the attending physician, personally viewed and interpreted this ECG.  Date: 02/02/2019 EKG Time: 1542 Rate: 111 Rhythm: Sinus tachycardia with PVCs QRS Axis: normal Intervals: Borderline QTc ST/T Wave abnormalities: normal Narrative Interpretation: no evidence of acute ischemia  ____________________________________________  RADIOLOGY  CXR: No focal infiltrate.  ET and OG tube in good position. CT head: No ICH or other acute abnormality  ____________________________________________   PROCEDURES  Procedure(s) performed: Yes  Procedure Name: Intubation Date/Time: 02/19/2019 4:09 PM Performed by: Arta Silence, MD Pre-anesthesia Checklist: Patient identified, Patient being monitored, Emergency Drugs available, Timeout performed and Suction available Oxygen Delivery Method: Non-rebreather mask Preoxygenation: Pre-oxygenation with 100% oxygen Induction Type: Rapid sequence Ventilation: Mask ventilation without difficulty Laryngoscope Size: Glidescope and 4 Tube size: 7.5 mm Number of attempts: 1 Placement Confirmation: ETT inserted through vocal cords under direct vision,  CO2 detector and Breath sounds checked- equal and bilateral Secured at: 27 cm Tube secured with: ETT holder       Critical Care performed: Yes  CRITICAL CARE Performed by: Arta Silence   Total critical care time: 120 minutes  Critical care time was exclusive of separately billable procedures and treating other patients.  Critical care was necessary to treat or prevent imminent or  life-threatening deterioration.  Critical care was time spent personally by me on the following activities: development of treatment plan with patient and/or surrogate as well as nursing, discussions with consultants, evaluation of patient's response to treatment, examination of patient, obtaining history from patient or surrogate, ordering and performing treatments and interventions, ordering and review of laboratory  studies, ordering and review of radiographic studies, pulse oximetry and re-evaluation of patient's condition. ____________________________________________   INITIAL IMPRESSION / ASSESSMENT AND PLAN / ED COURSE  Pertinent labs & imaging results that were available during my care of the patient were reviewed by me and considered in my medical decision making (see chart for details).  68 year old male with PMH as noted above presents after he was found unresponsive this afternoon.  Per EMS, he had been drinking last night and was driven to a friend's place.  He was last seen sometime last night or early this morning, and then found on the ground next to his vehicle.  He is unable to give history.  I reviewed the past medical records in Epic.  The patient was most recently admitted 2 years ago with suicidal ideation and cleared by psychiatry. I confirmed the past history of alcohol abuse, diabetes, and hypertension.  On exam, the patient is somnolent and unresponsive with snoring respirations and minimal gag reflex.  He does not respond to painful stimuli.  His vital signs are otherwise normal except for borderline tachycardia.  There is no visible trauma.  Pupils are reactive.  EKG was sent by EMS and showed some possible small ST elevations in the inferior leads with no reciprocal changes, however EKG here does not show any ischemic type findings.  Differential is very broad but includes intracranial hemorrhage, stroke, withdrawal seizures, other drug ingestion, metabolic etiology, or  less likely cardiac cause or infection.  Due to the inability to protect his airway, the patient required intubation which was completed successfully on the first attempt.  His vital signs remained stable.  We will obtain a CT head, chest x-ray, lab work-up, and reassess.  ----------------------------------------- 5:11 PM on March 16, 2019 -----------------------------------------  CT shows no ICH.  After returning from CT, the patient became hypotensive.  He is receiving his second liter of normal saline, and we have started peripheral norepinephrine.  I did a bedside ultrasound exam.  The patient does have evidence of decreased LVEF, but no pericardial effusion.  FAST exam is negative for peritoneal free fluid.  The aorta diameter appears normal.  ----------------------------------------- 5:40 PM on Mar 16, 2019 -----------------------------------------  The patient's heart rate began to range from the 90s to the 40s.  Although the EKG only shows a few slightly tall T waves and a normal QRS, I became suspicious of hyperkalemia as a possible etiology.  We initiated calcium gluconate, insulin and D50, and albuterol.  The potassium then resulted as greater than 7.5.  The heart rate is now in the 70s and the blood pressure is improving.  ----------------------------------------- 7:04 PM on 2019/03/16 -----------------------------------------  The patient's vital signs remain stable, with a good blood pressure and normal heart rate in the 80s.  His perfusion appears better.  I consulted Dr. Wynelle Link from nephrology who advised that he would come to evaluate the patient.  He recommended that we start the patient on a sodium bicarbonate drip in addition to the already initiated therapies.  Lab work-up is significant for elevated lactic acid and troponin.  I suspect that this is due to myocardial strain rather than primary cardiac etiology.  The lactic acid is likely due to hypoperfusion from the low  blood pressure, however given the elevated WBC count I will also cover the patient for infection/sepsis with broad-spectrum antibiotics.  I discussed the case with NP Ouma from the hospitalist service for admission. ______________________________  David Blackburn was evaluated in Emergency Department on Mar 16, 2019 for  the symptoms described in the history of present illness. He was evaluated in the context of the global COVID-19 pandemic, which necessitated consideration that the patient might be at risk for infection with the SARS-CoV-2 virus that causes COVID-19. Institutional protocols and algorithms that pertain to the evaluation of patients at risk for COVID-19 are in a state of rapid change based on information released by regulatory bodies including the CDC and federal and state organizations. These policies and algorithms were followed during the patient's care in the ED.  ____________________________________________   FINAL CLINICAL IMPRESSION(S) / ED DIAGNOSES  Final diagnoses:  Hyperkalemia  Hypotension, unspecified hypotension type  Unresponsiveness      NEW MEDICATIONS STARTED DURING THIS VISIT:  New Prescriptions   No medications on file     Note:  This document was prepared using Dragon voice recognition software and may include unintentional dictation errors.    Dionne Bucy, MD 03/02/[id number]

## 2019-02-18 NOTE — ED Notes (Signed)
Called Pharmacy and they will send up medication 

## 2019-02-19 ENCOUNTER — Inpatient Hospital Stay (HOSPITAL_COMMUNITY)
Admit: 2019-02-19 | Discharge: 2019-02-19 | Disposition: A | Discharge status: Home / Self Care | Payer: Medicare Other | Attending: Critical Care Medicine | Admitting: Critical Care Medicine

## 2019-02-19 ENCOUNTER — Inpatient Hospital Stay: Payer: Medicare Other

## 2019-02-19 DIAGNOSIS — J9601 Acute respiratory failure with hypoxia: Secondary | ICD-10-CM | POA: Diagnosis not present

## 2019-02-19 LAB — ECHOCARDIOGRAM COMPLETE
Height: 72 in
Weight: 3121.71 oz

## 2019-02-19 LAB — CBC WITH DIFFERENTIAL/PLATELET
Abs Immature Granulocytes: 0.04 10*3/uL (ref 0.00–0.07)
Basophils Absolute: 0 10*3/uL (ref 0.0–0.1)
Basophils Relative: 0 %
Eosinophils Absolute: 0 10*3/uL (ref 0.0–0.5)
Eosinophils Relative: 0 %
HCT: 44.6 % (ref 39.0–52.0)
Hemoglobin: 14.4 g/dL (ref 13.0–17.0)
Immature Granulocytes: 0 %
Lymphocytes Relative: 13 %
Lymphs Abs: 1.3 10*3/uL (ref 0.7–4.0)
MCH: 28.8 pg (ref 26.0–34.0)
MCHC: 32.3 g/dL (ref 30.0–36.0)
MCV: 89.2 fL (ref 80.0–100.0)
Monocytes Absolute: 1.3 10*3/uL — ABNORMAL HIGH (ref 0.1–1.0)
Monocytes Relative: 12 %
Neutro Abs: 7.8 10*3/uL — ABNORMAL HIGH (ref 1.7–7.7)
Neutrophils Relative %: 75 %
Platelets: 250 10*3/uL (ref 150–400)
RBC: 5 MIL/uL (ref 4.22–5.81)
RDW: 13.4 % (ref 11.5–15.5)
WBC: 10.5 10*3/uL (ref 4.0–10.5)
nRBC: 0 % (ref 0.0–0.2)

## 2019-02-19 LAB — COMPREHENSIVE METABOLIC PANEL
ALT: 117 U/L — ABNORMAL HIGH (ref 0–44)
AST: 314 U/L — ABNORMAL HIGH (ref 15–41)
Albumin: 3.3 g/dL — ABNORMAL LOW (ref 3.5–5.0)
Alkaline Phosphatase: 63 U/L (ref 38–126)
Anion gap: 7 (ref 5–15)
BUN: 31 mg/dL — ABNORMAL HIGH (ref 8–23)
CO2: 23 mmol/L (ref 22–32)
Calcium: 7.9 mg/dL — ABNORMAL LOW (ref 8.9–10.3)
Chloride: 110 mmol/L (ref 98–111)
Creatinine, Ser: 1.74 mg/dL — ABNORMAL HIGH (ref 0.61–1.24)
GFR calc Af Amer: 46 mL/min — ABNORMAL LOW (ref >60)
GFR calc non Af Amer: 40 mL/min — ABNORMAL LOW (ref >60)
Glucose, Bld: 213 mg/dL — ABNORMAL HIGH (ref 70–99)
Potassium: 4.1 mmol/L (ref 3.5–5.1)
Sodium: 140 mmol/L (ref 135–145)
Total Bilirubin: 0.8 mg/dL (ref 0.3–1.2)
Total Protein: 6.4 g/dL — ABNORMAL LOW (ref 6.5–8.1)

## 2019-02-19 LAB — BLOOD GAS, ARTERIAL
Acid-base deficit: 2.6 mmol/L — ABNORMAL HIGH (ref 0.0–2.0)
Bicarbonate: 21.8 mmol/L (ref 20.0–28.0)
FIO2: 0.5
MECHVT: 500 mL
Mechanical Rate: 20
O2 Saturation: 98.9 %
PEEP: 5 cmH2O
Patient temperature: 37
pCO2 arterial: 36 mmHg (ref 32.0–48.0)
pH, Arterial: 7.39 (ref 7.350–7.450)
pO2, Arterial: 127 mmHg — ABNORMAL HIGH (ref 83.0–108.0)

## 2019-02-19 LAB — PHOSPHORUS: Phosphorus: 3.5 mg/dL (ref 2.5–4.6)

## 2019-02-19 LAB — PROTIME-INR
INR: 1.3 — ABNORMAL HIGH (ref 0.8–1.2)
Prothrombin Time: 15.6 seconds — ABNORMAL HIGH (ref 11.4–15.2)

## 2019-02-19 LAB — GLUCOSE, CAPILLARY
Glucose-Capillary: 121 mg/dL — ABNORMAL HIGH (ref 70–99)
Glucose-Capillary: 133 mg/dL — ABNORMAL HIGH (ref 70–99)
Glucose-Capillary: 137 mg/dL — ABNORMAL HIGH (ref 70–99)
Glucose-Capillary: 162 mg/dL — ABNORMAL HIGH (ref 70–99)
Glucose-Capillary: 195 mg/dL — ABNORMAL HIGH (ref 70–99)
Glucose-Capillary: 198 mg/dL — ABNORMAL HIGH (ref 70–99)
Glucose-Capillary: 234 mg/dL — ABNORMAL HIGH (ref 70–99)

## 2019-02-19 LAB — AMMONIA: Ammonia: 27 umol/L (ref 9–35)

## 2019-02-19 LAB — HEMOGLOBIN A1C
Hgb A1c MFr Bld: 6.1 % — ABNORMAL HIGH (ref 4.8–5.6)
Mean Plasma Glucose: 128.37 mg/dL

## 2019-02-19 LAB — TROPONIN I (HIGH SENSITIVITY)
Troponin I (High Sensitivity): 1062 ng/L (ref <18)
Troponin I (High Sensitivity): 1019 ng/L (ref <18)

## 2019-02-19 LAB — HIV ANTIBODY (ROUTINE TESTING W REFLEX): HIV Screen 4th Generation wRfx: NONREACTIVE

## 2019-02-19 LAB — CK: Total CK: 21665 U/L — ABNORMAL HIGH (ref 49–397)

## 2019-02-19 LAB — MAGNESIUM: Magnesium: 2.1 mg/dL (ref 1.7–2.4)

## 2019-02-19 MED ORDER — SODIUM CHLORIDE 0.9 % IV SOLN
3.0000 g | Freq: Four times a day (QID) | INTRAVENOUS | Status: DC
  Administered 2019-02-19 – 2019-02-23 (×18): 3 g via INTRAVENOUS
  Filled 2019-02-19: qty 3
  Filled 2019-02-19: qty 8
  Filled 2019-02-19: qty 3
  Filled 2019-02-19 (×4): qty 8
  Filled 2019-02-19 (×2): qty 3
  Filled 2019-02-19 (×2): qty 8
  Filled 2019-02-19 (×2): qty 3
  Filled 2019-02-19 (×2): qty 8
  Filled 2019-02-19 (×2): qty 3
  Filled 2019-02-19: qty 8
  Filled 2019-02-19: qty 3
  Filled 2019-02-19: qty 8
  Filled 2019-02-19 (×3): qty 3

## 2019-02-19 NOTE — Consult Note (Signed)
Name: David Blackburn II MRN: [id number] DOB: [birth year]    ADMISSION DATE:  02/20/2019 CONSULTATION DATE: 02/05/2019  REFERRING MD : Rufina Falco, NP  CHIEF COMPLAINT: Unresponsiveness   BRIEF PATIENT DESCRIPTION:  68 yo male with hx of ETOH abuse admitted with acute encephalopathy, acute renal failure with hyperkalemia, and acute respiratory failure secondary to severe metabolic acidosis and possible aspiration pneumonia requiring mechanical intubation   SIGNIFICANT EVENTS/STUDIES:  10/23: Pt admitted to ICU mechanically intubated  10/23: CT Head revealed no CT evidence for acute intracranial abnormality. Atrophy and small vessel ischemic changes of the white matter 10/24  Moving to sternal rub without sedation, not following verbal communication    HISTORY OF PRESENT ILLNESS:   This is a 68 yo male with PMH of Substance Induced Mood Disorder, Sciatica, Psoriasis, HTN, Type II Diabetes Mellitus, Dental Abscess, and Alcohol Abuse.  He presented to [facility] ER via EMS on 10/23 after being found unresponsivene.  Per ER notes it was reported the pt drank alcohol with his friend the night of 10/22 and later on during the night he was dropped off at another friends house to "sleep off" the intoxication.  However, he was found the morning of 10/23 laying on the ground near a vehicle unresponsive prompting EMS notification.  Upon EMS arrival pt remained unresponsive with sbp in the 80's, he received narcan x3 doses en route to the ER without improvement in mentation. EMS EKG revealed possible small ST elevation in the inferior leads, no reciprocal changes, and no ischemic findings. Upon arrival to the ER pts O2 sats were 95% on 4L, however he remained unresponsive with snoring respirations requiring mechanical intubation.  CT Head negative for acute intracranial abnormality.  Lab results revealed K+ >7.5, CO2 21, glucose 146, BUN 36, creatinine 3.11, AST 108, ALT 63, troponin 501, lactic acid 3.1, wbc  14.7, acetaminophen level <62, salicylate level <2.2, toxicology positive for benzo's, UA negative for UTI, and abg ph 7.19/pCO2 39/pO2 178/bicarb 14.9. Repeat EKG revealed slight T wave elevation and normal QRS consistent with hyperkalemia.  He received 10 units iv novolog, 1 amp D50W, and sodium bicarb gtt initiated.  COVID-19 negative, however CXR concerning for atelectasis of right lung.  Pt received cefepime, vancomycin, and 2L NS bolus.  He was subsequently admitted to ICU per hospitalist team for additional workup and treatment.    PAST MEDICAL HISTORY :   has a past medical history of Alcohol abuse (01/30/2017), Dental abscess (08/29/2014), Diabetes mellitus without complication ([place]), Hypertension, Psoriasis, Sciatica, and Substance induced mood disorder ([place]) (01/30/2017).  has a past surgical history that includes Tonsillectomy (1961) and Vasectomy (1980). Prior to Admission medications   Medication Sig Start Date End Date Taking? Authorizing Provider  atorvastatin (LIPITOR) 40 MG tablet Take 1 tablet (40 mg total) by mouth daily. 11/18/18  Yes Johnson, Megan P, DO  cholecalciferol (VITAMIN D3) 25 MCG (1000 UT) tablet Take 1,000 Units by mouth daily.   Yes [provider]  folic acid (FOLVITE) 979 MCG tablet Take 400 mcg by mouth daily.   Yes [provider]  Inositol Niacinate (NIACIN FLUSH FREE) 500 MG CAPS Take 500 mg by mouth daily.   Yes [provider]  losartan (COZAAR) 50 MG tablet Take 1 tablet (50 mg total) by mouth daily. Patient taking differently: Take 50 mg by mouth at bedtime.  11/18/18  Yes Johnson, Megan P, DO  metFORMIN (GLUCOPHAGE-XR) 500 MG 24 hr tablet TAKE 1 TABLET BY MOUTH ONCE DAILY WITH  BREAKFAST Patient taking differently: Take 500 mg by mouth daily with breakfast.  11/18/18  Yes Johnson, Megan P, DO  Multiple Vitamin (MULTIVITAMIN) tablet Take 1 tablet by mouth daily.   Yes [provider]  naproxen (NAPROSYN) 500 MG tablet TAKE 1  TABLET BY MOUTH TWICE DAILY WITH MEALS Patient taking differently: Take 500 mg by mouth 2 (two) times daily with a meal.  12/28/18  Yes Johnson, Megan P, DO  QUEtiapine (SEROQUEL XR) 50 MG TB24 24 hr tablet Take 2 tablets (100 mg total) by mouth at bedtime. 11/18/18  Yes Johnson, Megan P, DO   No Known Allergies  FAMILY HISTORY:  family history includes Cancer in his brother and paternal grandfather; Heart disease in his maternal grandfather; Hypertension in his mother; Lung cancer in his mother; Stroke in his maternal grandmother. SOCIAL HISTORY:  reports that he quit smoking about 3 years ago. His smoking use included cigars. He has a 40.00 pack-year smoking history. He has quit using smokeless tobacco. He reports current alcohol use of about 21.0 standard drinks of alcohol per week. He reports previous drug use. Drug: Marijuana.  REVIEW OF SYSTEMS:   Unable to assess pt intubated   SUBJECTIVE:  Unable to assess pt intubated   VITAL SIGNS: Temp:  [97.1 F (36.2 C)-99.7 F (37.6 C)] 99.1 F (37.3 C) (10/24 1200) Pulse Rate:  [72-91] 72 (10/24 1200) Resp:  [16-28] 16 (10/24 1200) BP: (56-132)/(45-110) 109/82 (10/24 1200) SpO2:  [93 %-100 %] 96 % (10/24 1200) FiO2 (%):  [30 %-60 %] 30 % (10/24 1200) Weight:  [88 kg-88.5 kg] 88.5 kg (10/24 0120)  PHYSICAL EXAMINATION: General: acutely ill appearing male, NAD mechanically intubated  Neuro: sedated, not following commands, bilateral pupils 2 mm sluggish   HEENT: supple, no JVD  Cardiovascular: nsr, rrr, no R/G  Lungs: faint rhonchi throughout, even, non labored  Abdomen: +BS x4, soft, non distended  Musculoskeletal: normal bulk and tone, no edema  Skin: scattered psoriasis rash on back and bilateral lower extremities   Recent Labs  Lab 02/02/2019 1540 02/17/2019 2123 02/19/19 0415  NA 140 142 140  K >7.5* 5.6* 4.1  CL 105 108 110  CO2 21* 20* 23  BUN 36* 36* 31*  CREATININE 3.11* 2.73* 1.74*  GLUCOSE 146* 192* 213*   Recent  Labs  Lab 02/15/2019 1540 02/19/19 0415  HGB 14.8 14.4  HCT 47.0 44.6  WBC 14.7* 10.5  PLT 375 250   Ct Head Wo Contrast  Result Date: 01/27/2019 CLINICAL DATA:  Altered LOC EXAM: CT HEAD WITHOUT CONTRAST TECHNIQUE: Contiguous axial images were obtained from the base of the skull through the vertex without intravenous contrast. COMPARISON:  None. FINDINGS: Brain: No acute territorial infarction, hemorrhage, or intracranial mass. Moderate atrophy. Moderate hypodensity in the white matter, consistent with chronic small vessel ischemic change. Prominent ventricles felt secondary to atrophy. Vascular: No hyperdense vessels.  Carotid vascular calcification. Skull: Normal. Negative for fracture or focal lesion. Sinuses/Orbits: No acute finding. Other: None IMPRESSION: 1. No CT evidence for acute intracranial abnormality. 2. Atrophy and small vessel ischemic changes of the white matter. Electronically Signed   By: Donavan Foil M.D.   On: 02/05/2019 16:34   Dg Chest Portable 1 View  Result Date: 02/20/2019 CLINICAL DATA:  Status post intubation and NG tube placement. EXAM: PORTABLE CHEST 1 VIEW COMPARISON:  None. FINDINGS: Endotracheal tube is in place with its tip approximately 2.4 cm above the carina. NG tube tip is in the stomach in  good position. Elevation of the right hemidiaphragm noted. Right basilar atelectasis. The lungs are otherwise clear. No pneumothorax or pleural effusion. Heart size is normal. IMPRESSION: ETT and NG tube in good position. Subsegmental atelectasis right lung base. Electronically Signed   By: Inge Rise M.D.   On: 02/22/2019 16:30    ASSESSMENT / PLAN:  Acute respiratory failure secondary to severe metabolic acidosis and possible aspiration  Mechanical intubation  Full vent support for now-vent setting reviewed and established  SBT once all parameters met  VAP bundle implemented  Prn bronchodilator therapy    Rhabdomyolysis - patient found down post ETOH  ovedose for unknown time period  - AKI/hyperkalemia/ CK >20K improving  - IVF   - nephrology on case appreciate input  Elevated troponin secondary to demand ischemia vs. NSTEMI  Hx: HTN Continuous telemetry monitoring  Prn levophed gtt to maintain map >65 Hold outpatient antihypertensives, continue outpatient atorvastatin Trend troponin's   Acute renal failure with hyperkalemia  Lactic acidosis  Trend BMP  Replace electrolytes as indicated  Monitor UOP  Avoid nephrotoxic medications  Renal US pending  Nephrology consulted appreciate input-continue sodium bicarb gtt _0  ml/hr if acute renal failure does not improve will need CRRT   Leukocytosis concerning for possible aspiration pneumonia  Trend WBC and monitor fever curve  Trend PCT  Follow cultures  Will discontinue cefepime and start unasyn for aspiration; if MRSA PCR negative will discontinue vancomycin   Type II Diabetes Mellitus  CBG's q4hrs  SSI   Acute encephalopathy etiology unclear  Mechanical intubation pain/discomfort  Hx: ETOH abuse and Substance induced mood disorder  Maintain RASS goal 0 to -1 Prn versed and fentanyl to maintain RASS goal  MR Brain pending if mentation does not improve will consult neurology  Will initiate CIWA protocol WUA daily   SUP px: iv pepcid VTE px: subq heparin     Ottie Glazier, M.D.  P[facility] [place]    Critical care provider statement:    Critical care time (minutes):  35   Critical care time was exclusive of:  Separately billable procedures and  treating other patients   Critical care was necessary to treat or prevent imminent or  life-threatening deterioration of the following conditions:  acutely comatose, etoh overdose, rhabdomyolysis, AKI, hyperkalmeia, multiple comorbid conditions   Critical care was time spent personally by me on the following  activities:  Development of treatment plan with patient or surrogate,   discussions with consultants, evaluation of patient's response to  treatment, examination of patient, obtaining history from patient or  surrogate, ordering and performing treatments and interventions, ordering  and review of laboratory studies and re-evaluation of patient's condition   I assumed direction of critical care for this patient from another  provider in my specialty: no

## 2019-02-19 NOTE — Progress Notes (Addendum)
David Blackburn  MRN: [id number]  DOB/AGE: 1951/02/11 68 y.o.  Primary Care Physician:Johnson, Barb Merino, DO  Admit date: 02/17/2019  Chief Complaint:  Chief Complaint  Patient presents with  . unresponsive    S-Pt presented on  02/04/2019 with  Chief Complaint  Patient presents with  . unresponsive  .    Pt is intubated unable to offer any complaints  Medication . atorvastatin  40 mg Oral Daily  . chlorhexidine gluconate (MEDLINE KIT)  15 mL Mouth Rinse BID  . Chlorhexidine Gluconate Cloth  6 each Topical Q0600  . folic acid  1 mg Per Tube Daily  . heparin  5,000 Units Subcutaneous Q8H  . insulin aspart  0-9 Units Subcutaneous Q4H  . mouth rinse  15 mL Mouth Rinse 10 times per day  . thiamine  100 mg Oral Daily   Or  . thiamine  100 mg Intravenous Daily         ROS: Unable to get all systems reviewed.  Physical Exam: Vital signs in last 24 hours: Temp:  [97.1 F (36.2 C)-99.7 F (37.6 C)] 99.5 F (37.5 C) (10/24 1000) Pulse Rate:  [72-91] 76 (10/24 1000) Resp:  [16-28] 16 (10/24 0900) BP: (56-130)/(45-110) 122/84 (10/24 1000) SpO2:  [93 %-100 %] 93 % (10/24 1000) FiO2 (%):  [30 %-60 %] 30 % (10/24 1100) Weight:  [88 kg-88.5 kg] 88.5 kg (10/24 0120) Weight change:  Last BM Date: (PTA)  Intake/Output from previous day: 10/23 0701 - 10/24 0700 In: 4562.8 [I.V.:1971.6; NG/GT:190; IV Piggyback:2401.2] Out: 2440 [Urine:2090; Emesis/NG output:350] Total I/O In: 130 [NG/GT:30; IV Piggyback:100] Out: -    Physical Exam: General- pt is intubated Resp-ET tube in situ, bilateral breath sounds  NO Rhonchi CVS- S1S2 regular in rate and rhythm GIT- BS+, soft, NT, ND EXT- NO LE Edema, no cyanosis   Lab Results: CBC Recent Labs    02/08/2019 1540 02/19/19 0415  WBC 14.7* 10.5  HGB 14.8 14.4  HCT 47.0 44.6  PLT 375 250    BMET Recent Labs    02/02/2019 2123 02/19/19 0415  NA 142 140  K 5.6* 4.1  CL 108 110  CO2 20* 23  GLUCOSE 192* 213*  BUN  36* 31*  CREATININE 2.73* 1.74*  CALCIUM 8.1* 7.9*    Creat trend 2020   3.1=> 1.74 2016   1.4    Potassium > 7.5==> 4.1  CO2 18==> 24  MICRO Recent Results (from the past 240 hour(s))  SARS Coronavirus 2 by RT PCR (hospital order, performed in [facility] hospital lab) Nasopharyngeal Urine, Catheterized     Status: None   Collection Time: 02/22/2019  3:40 PM   Specimen: Urine, Catheterized; Nasopharyngeal  Result Value Ref Range Status   SARS Coronavirus 2 NEGATIVE NEGATIVE Final    Comment: (NOTE) If result is NEGATIVE SARS-CoV-2 target nucleic acids are NOT DETECTED. The SARS-CoV-2 RNA is generally detectable in upper and lower  respiratory specimens during the acute phase of infection. The lowest  concentration of SARS-CoV-2 viral copies this assay can detect is 250  copies / mL. A negative result does not preclude SARS-CoV-2 infection  and should not be used as the sole basis for treatment or other  patient management decisions.  A negative result may occur with  improper specimen collection / handling, submission of specimen other  than nasopharyngeal swab, presence of viral mutation(s) within the  areas targeted by this assay, and inadequate number of viral copies  (<250 copies /  mL). A negative result must be combined with clinical  observations, patient history, and epidemiological information. If result is POSITIVE SARS-CoV-2 target nucleic acids are DETECTED. The SARS-CoV-2 RNA is generally detectable in upper and lower  respiratory specimens dur ing the acute phase of infection.  Positive  results are indicative of active infection with SARS-CoV-2.  Clinical  correlation with patient history and other diagnostic information is  necessary to determine patient infection status.  Positive results do  not rule out bacterial infection or co-infection with other viruses. If result is PRESUMPTIVE POSTIVE SARS-CoV-2 nucleic acids MAY BE PRESENT.   A presumptive  positive result was obtained on the submitted specimen  and confirmed on repeat testing.  While 2019 novel coronavirus  (SARS-CoV-2) nucleic acids may be present in the submitted sample  additional confirmatory testing may be necessary for epidemiological  and / or clinical management purposes  to differentiate between  SARS-CoV-2 and other Sarbecovirus currently known to infect humans.  If clinically indicated additional testing with an alternate test  methodology [phone]) is advised. The SARS-CoV-2 RNA is generally  detectable in upper and lower respiratory sp ecimens during the acute  phase of infection. The expected result is Negative. Fact Sheet for Patients:  StrictlyIdeas.no Fact Sheet for Healthcare Providers: BankingDealers.co.za This test is not yet approved or cleared by the [place] FDA and has been authorized for detection and/or diagnosis of SARS-CoV-2 by FDA under an Emergency Use Authorization (EUA).  This EUA will remain in effect (meaning this test can be used) for the duration of the COVID-19 declaration under Section 564(b)(1) of the Act, 21 U.S.C. section 360bbb-3(b)(1), unless the authorization is terminated or revoked sooner. Performed at [facility], [address] [place], [place] [address]   Blood culture (routine x 2)     Status: None (Preliminary result)   Collection Time: 02/03/2019  7:19 PM   Specimen: BLOOD  Result Value Ref Range Status   Specimen Description BLOOD BLOOD LEFT HAND  Final   Special Requests   Final    BOTTLES DRAWN AEROBIC AND ANAEROBIC Blood Culture adequate volume   Culture   Final    NO GROWTH < 24 HOURS Performed at [facility], [address] [place], [place] [address]    Report Status PENDING  Incomplete  Blood culture (routine x 2)     Status: None (Preliminary result)   Collection Time: 01/31/2019  7:20 PM   Specimen: BLOOD  Result Value Ref Range  Status   Specimen Description BLOOD BLOOD RIGHT HAND  Final   Special Requests   Final    BOTTLES DRAWN AEROBIC AND ANAEROBIC Blood Culture adequate volume   Culture   Final    NO GROWTH < 24 HOURS Performed at [facility], [address] [place], [place] [address]    Report Status PENDING  Incomplete  MRSA PCR Screening     Status: None   Collection Time: 01/29/2019  8:50 PM   Specimen: Nasal Mucosa; Nasopharyngeal  Result Value Ref Range Status   MRSA by PCR NEGATIVE NEGATIVE Final    Comment:        The GeneXpert MRSA Assay (FDA approved for NASAL specimens only), is one component of a comprehensive MRSA colonization surveillance program. It is not intended to diagnose MRSA infection nor to guide or monitor treatment for MRSA infections. Performed at [facility] [address] [place], [place] [address]       Lab Results  Component Value Date   CALCIUM 7.9 (L) 02/19/2019   PHOS 3.5 02/19/2019               Impression:  Mr. Dantrell Schertzer Blackburn is a 68 y.o. white male with diabetes mellitus type Blackburn, hypertension, alcohol abuse, substance abuse, Bipolar disorder who was admitted to [facility] on 02/08/2019 for unresponsive   1)Renal  AKI secondary to prerenal/ATN                AKI secondary to hypovolemia/hypotension                AKI now better                Creatinine trending down                Adequate urine output  2)Respiratory failure  Pt was  intubated for airway protection. Remains intubated  3)Anemia none   4)CKD Mineral-Bone Disorder Phosphorus at goal.   5) hypotension      Patient was on pressors     Now hemodynamically better  6) hyperkalemia Now  better  7) acidosis Now better  8) aspiration pneumonia     Patient is on IV antibiotics      WBC counts are trending down     Plan:  Will DC IV bicarb We will follow bmet No need for renal replacement therapy    Caree Wolpert s Mazel Villela 02/19/2019, 11:37  AM

## 2019-02-19 NOTE — Progress Notes (Signed)
[facility]   PATIENT NAME: Daytona Retana    MR#:  [id number]  DATE OF BIRTH:  1950/06/12  SUBJECTIVE:  CHIEF COMPLAINT:   Chief Complaint  Patient presents with  . unresponsive   Intubated and sedated  REVIEW OF SYSTEMS:    Review of Systems  Unable to perform ROS: Intubated    DRUG ALLERGIES:  No Known Allergies  VITALS:  Blood pressure 109/82, pulse 72, temperature 99.1 F (37.3 C), temperature source Bladder, resp. rate 16, height 6' (1.829 m), weight 88.5 kg, SpO2 96 %.  PHYSICAL EXAMINATION:   Physical Exam  GENERAL:  68 y.o.-year-old patient lying in the bed.  EYES: Pupils equal, round, reactive to light and accommodation. No scleral icterus. Extraocular muscles intact.  HEENT: Head atraumatic, normocephalic. Oropharynx and nasopharynx clear. ETT NECK:  Supple, no jugular venous distention. No thyroid enlargement, no tenderness.  LUNGS: Normal breath sounds bilaterally, no wheezing, rales, rhonchi. No use of accessory muscles of respiration.  CARDIOVASCULAR: S1, S2 normal. No murmurs, rubs, or gallops.  ABDOMEN: Soft, nontender, nondistended. Bowel sounds present. No organomegaly or mass.  EXTREMITIES: No cyanosis, clubbing or edema b/l.    NEUROLOGIC: sedated PSYCHIATRIC: The patient is sedated SKIN: No obvious rash, lesion, or ulcer.   LABORATORY PANEL:   CBC Recent Labs  Lab 02/19/19 0415  WBC 10.5  HGB 14.4  HCT 44.6  PLT 250   ------------------------------------------------------------------------------------------------------------------ Chemistries  Recent Labs  Lab 02/19/19 0415  NA 140  K 4.1  CL 110  CO2 23  GLUCOSE 213*  BUN 31*  CREATININE 1.74*  CALCIUM 7.9*  MG 2.1  AST 314*  ALT 117*  ALKPHOS 63  BILITOT 0.8   ------------------------------------------------------------------------------------------------------------------  Cardiac Enzymes No results for input(s): TROPONINI in  the last 168 hours. ------------------------------------------------------------------------------------------------------------------  RADIOLOGY:  Ct Head Wo Contrast  Result Date: March 06, 2019 CLINICAL DATA:  Altered LOC EXAM: CT HEAD WITHOUT CONTRAST TECHNIQUE: Contiguous axial images were obtained from the base of the skull through the vertex without intravenous contrast. COMPARISON:  None. FINDINGS: Brain: No acute territorial infarction, hemorrhage, or intracranial mass. Moderate atrophy. Moderate hypodensity in the white matter, consistent with chronic small vessel ischemic change. Prominent ventricles felt secondary to atrophy. Vascular: No hyperdense vessels.  Carotid vascular calcification. Skull: Normal. Negative for fracture or focal lesion. Sinuses/Orbits: No acute finding. Other: None IMPRESSION: 1. No CT evidence for acute intracranial abnormality. 2. Atrophy and small vessel ischemic changes of the white matter. Electronically Signed   By: Jasmine Pang M.D.   On: Mar 06, 2019 16:34   Dg Chest Portable 1 View  Result Date: Mar 06, 2019 CLINICAL DATA:  Status post intubation and NG tube placement. EXAM: PORTABLE CHEST 1 VIEW COMPARISON:  None. FINDINGS: Endotracheal tube is in place with its tip approximately 2.4 cm above the carina. NG tube tip is in the stomach in good position. Elevation of the right hemidiaphragm noted. Right basilar atelectasis. The lungs are otherwise clear. No pneumothorax or pleural effusion. Heart size is normal. IMPRESSION: ETT and NG tube in good position. Subsegmental atelectasis right lung base. Electronically Signed   By: Drusilla Kanner M.D.   On: 03-06-19 16:30     ASSESSMENT AND PLAN:   68 y.o. male  with pertinent past medical history of EtOH abuse, diabetes mellitus, hypertension, psoriasis, sciatica, mood disorder with suicidal ideation and tobacco abuse presenting to the ED with unresponsiveness.  1. Acute respiratory failure with hypoxia with  aspiration pneumonia Primarily intubated to  protect airway Continue full ventilatory support.  Appreciate ICU help.  2. Acute rhabdomyolysis with acute kidney injury Trend CK levels.  Change bicarb drip to normal saline.  Discussed with nephrology. Monitor input and output. Repeat labs in a.m.  3. Elevated troponin - likely due to rhabdomyolysis -Trend troponin - Echocardiogram pending  4.  History of hypertension -now hypotensive in shock possibly hypovolemic - Continue Levophed gtt  5. Type 2 diabetes mellitus - SSI - Glycemic control per ICU protocol  6. EtOH Abuse -high risk for withdrawal CIWA protocol if needed  All the records are reviewed and case discussed with Care Management/Social Worker Management plans discussed with the patient, family and they are in agreement.  CODE STATUS: FULL CODE  TOTAL TIME TAKING CARE OF THIS PATIENT: 35 minutes.   POSSIBLE D/C IN 3-4 DAYS, DEPENDING ON CLINICAL CONDITION.  Leia Alf Jamarl Pew M.D on 02/19/2019 at 12:41 PM  Between 7am to 6pm - Pager - [phone]  After 6pm go to [email or web address] - password [email or web address] [facility]  Office  [phone]  CC: Primary care physician; Valerie Roys, DO  Note: This dictation was prepared with Dragon dictation along with smaller phrase technology. Any transcriptional errors that result from this process are unintentional.

## 2019-02-19 NOTE — Progress Notes (Signed)
Pharmacy Antibiotic Note  David Blackburn is a 68 y.o. male admitted on 02/21/19 with aspiration pneumonia.  Pharmacy has been consulted for Unasyn dosing.  Plan: Will start Unasyn 3g IV q6h and treat a bit aggressively, if renal function worsens will decrease dose to q12h; however, patient is in AKI w/ a Scr of 3.11 on admission now has gone down to 2.73, will continue to monitor.  Height: 6' (182.9 cm) Weight: 195 lb 1.7 oz (88.5 kg) IBW/kg (Calculated) : 77.6  Temp (24hrs), Avg:98.8 F (37.1 C), Min:97.1 F (36.2 C), Max:99.7 F (37.6 C)  Recent Labs  Lab 2019-02-21 1540 Feb 21, 2019 1745 02-21-19 2123  WBC 14.7*  --   --   CREATININE 3.11*  --  2.73*  LATICACIDVEN 3.1* 3.2*  --     Estimated Creatinine Clearance: 29.2 mL/min (A) (by C-G formula based on SCr of 2.73 mg/dL (H)).    No Known Allergies   Thank you for allowing pharmacy to be a part of this patient's care.  Tobie Lords, PharmD, BCPS Clinical Pharmacist 02/19/2019 4:00 AM

## 2019-02-20 DIAGNOSIS — R4189 Other symptoms and signs involving cognitive functions and awareness: Secondary | ICD-10-CM

## 2019-02-20 DIAGNOSIS — G931 Anoxic brain damage, not elsewhere classified: Secondary | ICD-10-CM | POA: Diagnosis not present

## 2019-02-20 LAB — BLOOD GAS, ARTERIAL
Acid-Base Excess: 3.9 mmol/L — ABNORMAL HIGH (ref 0.0–2.0)
Bicarbonate: 27.7 mmol/L (ref 20.0–28.0)
FIO2: 0.3
MECHVT: 500 mL
O2 Saturation: 96.2 %
PEEP: 5 cmH2O
Patient temperature: 37
RATE: 16 resp/min
pCO2 arterial: 38 mmHg (ref 32.0–48.0)
pH, Arterial: 7.47 — ABNORMAL HIGH (ref 7.350–7.450)
pO2, Arterial: 78 mmHg — ABNORMAL LOW (ref 83.0–108.0)

## 2019-02-20 LAB — CBC WITH DIFFERENTIAL/PLATELET
Abs Immature Granulocytes: 0.03 10*3/uL (ref 0.00–0.07)
Basophils Absolute: 0 10*3/uL (ref 0.0–0.1)
Basophils Relative: 0 %
Eosinophils Absolute: 0.1 10*3/uL (ref 0.0–0.5)
Eosinophils Relative: 1 %
HCT: 39 % (ref 39.0–52.0)
Hemoglobin: 12.6 g/dL — ABNORMAL LOW (ref 13.0–17.0)
Immature Granulocytes: 0 %
Lymphocytes Relative: 16 %
Lymphs Abs: 1.7 10*3/uL (ref 0.7–4.0)
MCH: 29 pg (ref 26.0–34.0)
MCHC: 32.3 g/dL (ref 30.0–36.0)
MCV: 89.7 fL (ref 80.0–100.0)
Monocytes Absolute: 1.3 10*3/uL — ABNORMAL HIGH (ref 0.1–1.0)
Monocytes Relative: 13 %
Neutro Abs: 7.1 10*3/uL (ref 1.7–7.7)
Neutrophils Relative %: 70 %
Platelets: 194 10*3/uL (ref 150–400)
RBC: 4.35 MIL/uL (ref 4.22–5.81)
RDW: 13.7 % (ref 11.5–15.5)
WBC: 10.2 10*3/uL (ref 4.0–10.5)
nRBC: 0 % (ref 0.0–0.2)

## 2019-02-20 LAB — COMPREHENSIVE METABOLIC PANEL
ALT: 107 U/L — ABNORMAL HIGH (ref 0–44)
AST: 272 U/L — ABNORMAL HIGH (ref 15–41)
Albumin: 2.9 g/dL — ABNORMAL LOW (ref 3.5–5.0)
Alkaline Phosphatase: 51 U/L (ref 38–126)
Anion gap: 6 (ref 5–15)
BUN: 17 mg/dL (ref 8–23)
CO2: 28 mmol/L (ref 22–32)
Calcium: 8 mg/dL — ABNORMAL LOW (ref 8.9–10.3)
Chloride: 110 mmol/L (ref 98–111)
Creatinine, Ser: 1.16 mg/dL (ref 0.61–1.24)
GFR calc Af Amer: >60 mL/min (ref >60)
GFR calc non Af Amer: >60 mL/min (ref >60)
Glucose, Bld: 140 mg/dL — ABNORMAL HIGH (ref 70–99)
Potassium: 3.8 mmol/L (ref 3.5–5.1)
Sodium: 144 mmol/L (ref 135–145)
Total Bilirubin: 1 mg/dL (ref 0.3–1.2)
Total Protein: 5.8 g/dL — ABNORMAL LOW (ref 6.5–8.1)

## 2019-02-20 LAB — GLUCOSE, CAPILLARY
Glucose-Capillary: 135 mg/dL — ABNORMAL HIGH (ref 70–99)
Glucose-Capillary: 118 mg/dL — ABNORMAL HIGH (ref 70–99)
Glucose-Capillary: 147 mg/dL — ABNORMAL HIGH (ref 70–99)
Glucose-Capillary: 116 mg/dL — ABNORMAL HIGH (ref 70–99)
Glucose-Capillary: 92 mg/dL (ref 70–99)

## 2019-02-20 LAB — URINE CULTURE: Culture: NO GROWTH

## 2019-02-20 LAB — PROCALCITONIN: Procalcitonin: 0.16 ng/mL

## 2019-02-20 LAB — MAGNESIUM: Magnesium: 2 mg/dL (ref 1.7–2.4)

## 2019-02-20 LAB — CK: Total CK: 8417 U/L — ABNORMAL HIGH (ref 49–397)

## 2019-02-20 LAB — PHOSPHORUS: Phosphorus: 1.7 mg/dL — ABNORMAL LOW (ref 2.5–4.6)

## 2019-02-20 MED ORDER — POTASSIUM PHOSPHATES 15 MMOLE/5ML IV SOLN
20.0000 mmol | Freq: Once | INTRAVENOUS | Status: AC
  Administered 2019-02-20: 20 mmol via INTRAVENOUS
  Filled 2019-02-20: qty 6.67

## 2019-02-20 MED ORDER — ASPIRIN 300 MG RE SUPP
300.0000 mg | Freq: Every day | RECTAL | Status: DC
  Administered 2019-02-20 – 2019-02-22 (×3): 300 mg via RECTAL
  Filled 2019-02-20 (×3): qty 1

## 2019-02-20 MED ORDER — POTASSIUM PHOSPHATES 15 MMOLE/5ML IV SOLN
20.0000 mmol | Freq: Once | INTRAVENOUS | Status: DC
  Filled 2019-02-20: qty 6.67

## 2019-02-20 NOTE — Consult Note (Signed)
Reason for Consult:Unresponsive Referring Physician: Sudini  CC: Unresponsive  HPI: David Blackburn is an 68 y.o. male with a history of ETOH abuse, HTN and DM who is unable to provide any history due to mental status therefore all history obtained from the chart.  Per ED report, patient was found lying next to his car yesterday afternoon, unresponsive. It is unclear how long he has been on the ground. Last known normal was the night before according to friends. He apparently had been drinking the previous night with friends and was dropped off at an unknown location by friends "to sleep it off". On EMS arrival, patient was noted to be hypotensive with SBP in the 80s, Narcan was given on route with no response.   Past Medical History:  Diagnosis Date  . Alcohol abuse 01/30/2017  . Dental abscess 08/29/2014  . Diabetes mellitus without complication ([place])   . Hypertension   . Psoriasis   . Sciatica   . Substance induced mood disorder ([place]) 01/30/2017    Past Surgical History:  Procedure Laterality Date  . TONSILLECTOMY  1961  . VASECTOMY  1980    Family History  Problem Relation Age of Onset  . Lung cancer Mother   . Hypertension Mother   . Cancer Brother        Brain  . Stroke Maternal Grandmother   . Heart disease Maternal Grandfather   . Cancer Paternal Grandfather        Stomach    Social History:  reports that he quit smoking about 3 years ago. His smoking use included cigars. He has a 40.00 pack-year smoking history. He has quit using smokeless tobacco. He reports current alcohol use of about 21.0 standard drinks of alcohol per week. He reports previous drug use. Drug: Marijuana.  No Known Allergies  Medications:  I have reviewed the patient's current medications. Prior to Admission:  Medications Prior to Admission  Medication Sig Dispense Refill Last Dose  . atorvastatin (LIPITOR) 40 MG tablet Take 1 tablet (40 mg total) by mouth daily. 90 tablet 1 24+ hours at  Unknown  . cholecalciferol (VITAMIN D3) 25 MCG (1000 UT) tablet Take 1,000 Units by mouth daily.   24+ hours at Unknown  . folic acid (FOLVITE) 161 MCG tablet Take 400 mcg by mouth daily.   24+ hours at Unknown  . Inositol Niacinate (NIACIN FLUSH FREE) 500 MG CAPS Take 500 mg by mouth daily.   24+ hours at Unknown  . losartan (COZAAR) 50 MG tablet Take 1 tablet (50 mg total) by mouth daily. (Patient taking differently: Take 50 mg by mouth at bedtime. ) 90 tablet 1 24+ hours at Unknown  . metFORMIN (GLUCOPHAGE-XR) 500 MG 24 hr tablet TAKE 1 TABLET BY MOUTH ONCE DAILY WITH BREAKFAST (Patient taking differently: Take 500 mg by mouth daily with breakfast. ) 90 tablet 1 24+ hours at Unknown  . Multiple Vitamin (MULTIVITAMIN) tablet Take 1 tablet by mouth daily.   24+ hours at Unknown  . naproxen (NAPROSYN) 500 MG tablet TAKE 1 TABLET BY MOUTH TWICE DAILY WITH MEALS (Patient taking differently: Take 500 mg by mouth 2 (two) times daily with a meal. ) 60 tablet 6 24+ hours at Unknown  . QUEtiapine (SEROQUEL XR) 50 MG TB24 24 hr tablet Take 2 tablets (100 mg total) by mouth at bedtime. 180 tablet 1 24+ hours at Unknown   Scheduled: . atorvastatin  40 mg Oral Daily  . chlorhexidine gluconate (MEDLINE KIT)  15 mL  Mouth Rinse BID  . Chlorhexidine Gluconate Cloth  6 each Topical Q0600  . folic acid  1 mg Per Tube Daily  . heparin  5,000 Units Subcutaneous Q8H  . insulin aspart  0-9 Units Subcutaneous Q4H  . mouth rinse  15 mL Mouth Rinse 10 times per day  . thiamine  100 mg Oral Daily   Or  . thiamine  100 mg Intravenous Daily    ROS: Unable to obtain due to mental status  Physical Examination: Blood pressure (!) 145/90, pulse 77, temperature 99.5 F (37.5 C), temperature source Axillary, resp. rate 17, height 6' (1.829 m), weight 88.9 kg, SpO2 98 %.  HEENT-  Normocephalic, no lesions, without obvious abnormality.  Normal external eye and conjunctiva.  Normal TM's bilaterally.  Normal auditory  canals and external ears. Normal external nose, mucus membranes and septum.  Normal pharynx. Cardiovascular- S1, S2 normal, pulses palpable throughout   Lungs- chest clear, no wheezing, rales, normal symmetric air entry Abdomen- soft, non-tender; bowel sounds normal; no masses,  no organomegaly Extremities- no edema Lymph-no adenopathy palpable Musculoskeletal-no joint tenderness, deformity or swelling Skin-warm and dry, no hyperpigmentation, vitiligo, or suspicious lesions  Neurological Examination   Mental Status: Patient does not respond to verbal stimuli. Localizes to pain with both upper extremities with deep sternal rub.  Does not follow commands.  No verbalizations noted.  Cranial Nerves: Blackburn: patient does not respond confrontation bilaterally, pupils right 3 mm, left 3 mm,and reactive bilaterally III,IV,VI: Oculocephalic response present bilaterally. V,VII: corneal reflex present bilaterally  VIII: patient does not respond to verbal stimuli IX,X: gag reflex unable to test, XI: trapezius strength unable to test bilaterally XII: tongue strength unable to test Motor: Moves both upper extremities with sternal rub.  Moves lower extremities if soles rubbed.   Sensory: Does not respond to noxious stimuli in any extremity. Deep Tendon Reflexes:  Symmetric throughout. Plantars: Mute bilaterally Cerebellar: Unable to perform  Laboratory Studies:   Basic Metabolic Panel: Recent Labs  Lab 02/22/2019 1540 01/28/2019 2123 02/19/19 0415 02/20/19 0427  NA 140 142 140 144  K >7.5* 5.6* 4.1 3.8  CL 105 108 110 110  CO2 21* 20* 23 28  GLUCOSE 146* 192* 213* 140*  BUN 36* 36* 31* 17  CREATININE 3.11* 2.73* 1.74* 1.16  CALCIUM 8.6* 8.1* 7.9* 8.0*  MG  --   --  2.1 2.0  PHOS  --   --  3.5 1.7*    Liver Function Tests: Recent Labs  Lab 02/22/2019 1540 02/19/19 0415 02/20/19 0427  AST 108* 314* 272*  ALT 63* 117* 107*  ALKPHOS 81 63 51  BILITOT 0.9 0.8 1.0  PROT 7.8 6.4* 5.8*   ALBUMIN 4.2 3.3* 2.9*   Recent Labs  Lab 02/09/2019 1954  LIPASE 51   Recent Labs  Lab 02/17/2019 2332  AMMONIA 27    CBC: Recent Labs  Lab 02/01/2019 1540 02/19/19 0415 02/20/19 0427  WBC 14.7* 10.5 10.2  NEUTROABS 12.1* 7.8* 7.1  HGB 14.8 14.4 12.6*  HCT 47.0 44.6 39.0  MCV 93.1 89.2 89.7  PLT 375 250 194    Cardiac Enzymes: Recent Labs  Lab 02/21/2019 1540 02/19/19 0415 02/20/19 0427  CKTOTAL 7,081* 21,665* 8,417*    BNP: Invalid input(s): POCBNP  CBG: Recent Labs  Lab 02/19/19 1613 02/19/19 1929 02/19/19 2342 02/20/19 0357 02/20/19 0737  GLUCAP 121* 133* 137* 135* 118*    Microbiology: Results for orders placed or performed during the hospital encounter of 02/02/2019  SARS Coronavirus 2 by RT PCR (hospital order, performed in [facility] hospital lab) Nasopharyngeal Urine, Catheterized     Status: None   Collection Time: 02/08/2019  3:40 PM   Specimen: Urine, Catheterized; Nasopharyngeal  Result Value Ref Range Status   SARS Coronavirus 2 NEGATIVE NEGATIVE Final    Comment: (NOTE) If result is NEGATIVE SARS-CoV-2 target nucleic acids are NOT DETECTED. The SARS-CoV-2 RNA is generally detectable in upper and lower  respiratory specimens during the acute phase of infection. The lowest  concentration of SARS-CoV-2 viral copies this assay can detect is 250  copies / mL. A negative result does not preclude SARS-CoV-2 infection  and should not be used as the sole basis for treatment or other  patient management decisions.  A negative result may occur with  improper specimen collection / handling, submission of specimen other  than nasopharyngeal swab, presence of viral mutation(s) within the  areas targeted by this assay, and inadequate number of viral copies  (<250 copies / mL). A negative result must be combined with clinical  observations, patient history, and epidemiological information. If result is POSITIVE SARS-CoV-2 target nucleic acids are  DETECTED. The SARS-CoV-2 RNA is generally detectable in upper and lower  respiratory specimens dur ing the acute phase of infection.  Positive  results are indicative of active infection with SARS-CoV-2.  Clinical  correlation with patient history and other diagnostic information is  necessary to determine patient infection status.  Positive results do  not rule out bacterial infection or co-infection with other viruses. If result is PRESUMPTIVE POSTIVE SARS-CoV-2 nucleic acids MAY BE PRESENT.   A presumptive positive result was obtained on the submitted specimen  and confirmed on repeat testing.  While 2019 novel coronavirus  (SARS-CoV-2) nucleic acids may be present in the submitted sample  additional confirmatory testing may be necessary for epidemiological  and / or clinical management purposes  to differentiate between  SARS-CoV-2 and other Sarbecovirus currently known to infect humans.  If clinically indicated additional testing with an alternate test  methodology [phone]) is advised. The SARS-CoV-2 RNA is generally  detectable in upper and lower respiratory sp ecimens during the acute  phase of infection. The expected result is Negative. Fact Sheet for Patients:  StrictlyIdeas.no Fact Sheet for Healthcare Providers: BankingDealers.co.za This test is not yet approved or cleared by the [place] FDA and has been authorized for detection and/or diagnosis of SARS-CoV-2 by FDA under an Emergency Use Authorization (EUA).  This EUA will remain in effect (meaning this test can be used) for the duration of the COVID-19 declaration under Section 564(b)(1) of the Act, 21 U.S.C. section 360bbb-3(b)(1), unless the authorization is terminated or revoked sooner. Performed at [facility], [address] [place], [place] [address]   Blood culture (routine x 2)     Status: None (Preliminary result)   Collection Time: 02/06/2019   7:19 PM   Specimen: BLOOD  Result Value Ref Range Status   Specimen Description BLOOD BLOOD LEFT HAND  Final   Special Requests   Final    BOTTLES DRAWN AEROBIC AND ANAEROBIC Blood Culture adequate volume   Culture   Final    NO GROWTH 2 DAYS Performed at [facility], [address], [place], [place] [address]    Report Status PENDING  Incomplete  Blood culture (routine x 2)     Status: None (Preliminary result)   Collection Time: 02/15/2019  7:20 PM   Specimen: BLOOD  Result Value Ref  Range Status   Specimen Description BLOOD BLOOD RIGHT HAND  Final   Special Requests   Final    BOTTLES DRAWN AEROBIC AND ANAEROBIC Blood Culture adequate volume   Culture   Final    NO GROWTH 2 DAYS Performed at [facility], [address], [place], [place] [address]    Report Status PENDING  Incomplete  MRSA PCR Screening     Status: None   Collection Time: 02/08/2019  8:50 PM   Specimen: Nasal Mucosa; Nasopharyngeal  Result Value Ref Range Status   MRSA by PCR NEGATIVE NEGATIVE Final    Comment:        The GeneXpert MRSA Assay (FDA approved for NASAL specimens only), is one component of a comprehensive MRSA colonization surveillance program. It is not intended to diagnose MRSA infection nor to guide or monitor treatment for MRSA infections. Performed at [facility], [address] [place], [place] [address]   Culture, respiratory (non-expectorated)     Status: None (Preliminary result)   Collection Time: 02/19/19  2:59 AM   Specimen: Tracheal Aspirate; Respiratory  Result Value Ref Range Status   Specimen Description   Final    TRACHEAL ASPIRATE Performed at [facility], [address] [place], [place] [address]    Special Requests   Final    NONE Performed at [facility], [address], [place] [address]    Gram Stain   Final    RARE WBC PRESENT, PREDOMINANTLY PMN ABUNDANT GRAM POSITIVE COCCI RARE GRAM POSITIVE  RODS Performed at [facility], [place] [address] [place],  [address]    Culture PENDING  Incomplete   Report Status PENDING  Incomplete    Coagulation Studies: Recent Labs    02/10/2019 1540 02/19/19 0415  LABPROT 15.0 15.6*  INR 1.2 1.3*    Urinalysis:  Recent Labs  Lab 02/10/2019 1540  COLORURINE YELLOW*  LABSPEC 1.008  PHURINE 5.0  GLUCOSEU NEGATIVE  HGBUR SMALL*  BILIRUBINUR NEGATIVE  KETONESUR NEGATIVE  PROTEINUR 30*  NITRITE NEGATIVE  LEUKOCYTESUR NEGATIVE    Lipid Panel:     Component Value Date/Time   CHOL 103 11/18/2018 1346   TRIG 216 (H) 11/18/2018 1346   HDL 37 (L) 11/18/2018 1346   CHOLHDL 6.0 (H) 06/24/2017 1006   LDLCALC 23 11/18/2018 1346    HgbA1C:  Lab Results  Component Value Date   HGBA1C 6.1 (H) 02/13/2019    Urine Drug Screen:      Component Value Date/Time   LABOPIA NONE DETECTED 02/26/2019 1540   COCAINSCRNUR NONE DETECTED 02/10/2019 1540   LABBENZ POSITIVE (A) 01/27/2019 1540   AMPHETMU NONE DETECTED 02/01/2019 1540   THCU NONE DETECTED 02/17/2019 1540   LABBARB NONE DETECTED 02/23/2019 1540    Alcohol Level:  Recent Labs  Lab 02/10/2019 1540  ETH <10    Other results: EKG: sinus rhythm at 78 bpm.  Imaging: Dg Eye Foreign Body  Result Date: 02/19/2019 CLINICAL DATA:  Metal working/exposure; clearance prior to MRI EXAM: ORBITS FOR FOREIGN BODY - 11 VIEW COMPARISON:  None. FINDINGS: Water's view obtained. No intraorbital radiopaque foreign body. Visualized paranasal sinuses clear. No fracture or dislocation. IMPRESSION: No evidence of metallic foreign body within the orbits on single waters view. Electronically Signed   By: Lowella Grip III M.D.   On: 02/19/2019 15:55   Dg Abd 1 View  Result Date: 02/19/2019 CLINICAL DATA:  Nasogastric tube placement EXAM: ABDOMEN - 1 VIEW COMPARISON:  None. FINDINGS: Nasogastric tube tip and side port are in the stomach. There is moderate stool in the colon. There is no  bowel dilatation or air-fluid level to suggest bowel obstruction. No free air. There is degenerative change in the lumbar spine. 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 There is iliac artery atherosclerosis, primarily on the right. IMPRESSION: Nasogastric tube tip and side port in stomach. No bowel obstruction or free air. 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 Electronically Signed   By: Lowella Grip III M.D.   On: 02/19/2019 15:56   Ct Head Wo Contrast  Result Date: 01/30/2019 CLINICAL DATA:  Altered LOC EXAM: CT HEAD WITHOUT CONTRAST TECHNIQUE: Contiguous axial images were obtained from the base of the skull through the vertex without intravenous contrast. COMPARISON:  None. FINDINGS: Brain: No acute territorial infarction, hemorrhage, or intracranial mass. Moderate atrophy. Moderate hypodensity in the white matter, consistent with chronic small vessel ischemic change. Prominent ventricles felt secondary to atrophy. Vascular: No hyperdense vessels.  Carotid vascular calcification. Skull: Normal. Negative for fracture or focal lesion. Sinuses/Orbits: No acute finding. Other: None IMPRESSION: 1. No CT evidence for acute intracranial abnormality. 2. Atrophy and small vessel ischemic changes of the white matter. Electronically Signed   By: Donavan Foil M.D.   On: 02/17/2019 16:34   Mr Angio Head Wo Contrast  Result Date: 02/20/2019 CLINICAL DATA:  Initial evaluation for acute altered mental status, found down, hypotensive. EXAM: MRI HEAD WITHOUT CONTRAST MRA HEAD WITHOUT CONTRAST TECHNIQUE: Multiplanar, multiecho pulse sequences of the brain and surrounding structures were obtained without intravenous contrast. Angiographic images of the head were obtained using MRA technique without contrast. COMPARISON:  Prior CT from 02/05/2019. FINDINGS: MRI HEAD FINDINGS Brain: Examination  mildly degraded by motion artifact. Diffuse prominence of the CSF containing spaces compatible with generalized cerebral atrophy. Patchy and confluent T2/FLAIR hyperintensity within the periventricular and deep white matter both cerebral hemispheres most consistent chronic small vessel ischemic disease, moderate in nature. There is abnormal symmetric restricted diffusion involving the globus pallidi bilaterally. Mild scattered patchy involvement of the adjacent basal ganglia and internal capsules. Scattered multifocal patchy foci of restricted diffusion seen involving the periventricular, deep, and subcortical white matter of both cerebral hemispheres, also fairly symmetric in nature. Symmetric restricted diffusion also seen involving the hippocampal formations bilaterally. Patchy restricted diffusion also seen involving the cerebellar hemispheres bilaterally, left greater than right. Associated T2/FLAIR hyperintensity throughout the areas affected without significant mass effect. Few scattered foci of susceptibility artifact seen within the areas affected, most notable at the globus pallidi and posterior hippocampal formations, consistent with associated petechial hemorrhage. Given history of hypertension, findings favored to reflect a hypoperfusion/anoxic insult. No other evidence for acute vascular infarct. Gray-white matter differentiation otherwise maintained. No encephalomalacia to suggest chronic cortical infarction. No other evidence for acute or chronic intracranial hemorrhage. No mass lesion, midline shift or mass effect. No hydrocephalus. Pituitary gland normal. No extra-axial fluid collection. Vascular: Major intracranial vascular flow voids are maintained. Skull and upper cervical spine: Craniocervical junction normal. Bone marrow signal intensity within normal limits. No scalp soft tissue abnormality. Sinuses/Orbits: Globes and orbital soft tissues within normal limits. Scattered mucosal thickening  throughout the paranasal sinuses. Fluid seen layering within the nasopharynx. Small bilateral mastoid effusions. Patient likely intubated. Other: None. MRA HEAD FINDINGS ANTERIOR CIRCULATION: Examination moderately degraded by motion artifact. Distal cervical segments of the  internal carotid arteries are patent with symmetric antegrade flow. Petrous, cavernous, and supraclinoid segments patent without obvious high-grade stenosis or other abnormality. A1 segments patent. Grossly normal anterior communicating artery complex. Anterior cerebral arteries widely patent to their distal aspects. No M1 stenosis or occlusion. Grossly normal MCA bifurcations. Distal MCA branches well perfused and fairly symmetric. POSTERIOR CIRCULATION: Vertebral arteries patent to the vertebrobasilar junction without appreciable stenosis. Patent right PICA. Left PICA not seen. Basilar widely patent to its distal aspect. Apparent focal narrowing of the mid basilar artery on MIP reconstructions felt to be most consistent with artifact. Superior cerebral arteries patent bilaterally. Both posterior cerebral arteries primarily supplied via the basilar and are well perfused to their distal aspects. No appreciable intracranial aneurysm. IMPRESSION: MRI HEAD IMPRESSION: 1. Extensive and fairly symmetric multifocal areas of restricted diffusion and signal abnormality involving the bilateral basal ganglia, hemispheric cerebral white matter, bilateral hippocampal formations and cerebellum as above. Findings are nonspecific, with primary differential consideration consisting of possible hypoperfusion/anoxic injury given history of hypotension. Possible toxic metabolic derangement would be the primary differential consideration. 2. Underlying moderately advanced cerebral atrophy with chronic small vessel ischemic disease. MRA HEAD IMPRESSION: 1. Motion degraded exam. 2. Grossly negative MRA with no large vessel occlusion. No definite hemodynamically  significant or correctable stenosis. Electronically Signed   By: Jeannine Boga M.D.   On: 02/20/2019 01:35   Mr Brain Wo Contrast  Result Date: 02/20/2019 CLINICAL DATA:  Initial evaluation for acute altered mental status, found down, hypotensive. EXAM: MRI HEAD WITHOUT CONTRAST MRA HEAD WITHOUT CONTRAST TECHNIQUE: Multiplanar, multiecho pulse sequences of the brain and surrounding structures were obtained without intravenous contrast. Angiographic images of the head were obtained using MRA technique without contrast. COMPARISON:  Prior CT from 02/15/2019. FINDINGS: MRI HEAD FINDINGS Brain: Examination mildly degraded by motion artifact. Diffuse prominence of the CSF containing spaces compatible with generalized cerebral atrophy. Patchy and confluent T2/FLAIR hyperintensity within the periventricular and deep white matter both cerebral hemispheres most consistent chronic small vessel ischemic disease, moderate in nature. There is abnormal symmetric restricted diffusion involving the globus pallidi bilaterally. Mild scattered patchy involvement of the adjacent basal ganglia and internal capsules. Scattered multifocal patchy foci of restricted diffusion seen involving the periventricular, deep, and subcortical white matter of both cerebral hemispheres, also fairly symmetric in nature. Symmetric restricted diffusion also seen involving the hippocampal formations bilaterally. Patchy restricted diffusion also seen involving the cerebellar hemispheres bilaterally, left greater than right. Associated T2/FLAIR hyperintensity throughout the areas affected without significant mass effect. Few scattered foci of susceptibility artifact seen within the areas affected, most notable at the globus pallidi and posterior hippocampal formations, consistent with associated petechial hemorrhage. Given history of hypertension, findings favored to reflect a hypoperfusion/anoxic insult. No other evidence for acute vascular  infarct. Gray-white matter differentiation otherwise maintained. No encephalomalacia to suggest chronic cortical infarction. No other evidence for acute or chronic intracranial hemorrhage. No mass lesion, midline shift or mass effect. No hydrocephalus. Pituitary gland normal. No extra-axial fluid collection. Vascular: Major intracranial vascular flow voids are maintained. Skull and upper cervical spine: Craniocervical junction normal. Bone marrow signal intensity within normal limits. No scalp soft tissue abnormality. Sinuses/Orbits: Globes and orbital soft tissues within normal limits. Scattered mucosal thickening throughout the paranasal sinuses. Fluid seen layering within the nasopharynx. Small bilateral mastoid effusions. Patient likely intubated. Other: None. MRA HEAD FINDINGS ANTERIOR CIRCULATION: Examination moderately degraded by motion artifact. Distal cervical segments of the internal carotid arteries are patent with symmetric antegrade flow.  Petrous, cavernous, and supraclinoid segments patent without obvious high-grade stenosis or other abnormality. A1 segments patent. Grossly normal anterior communicating artery complex. Anterior cerebral arteries widely patent to their distal aspects. No M1 stenosis or occlusion. Grossly normal MCA bifurcations. Distal MCA branches well perfused and fairly symmetric. POSTERIOR CIRCULATION: Vertebral arteries patent to the vertebrobasilar junction without appreciable stenosis. Patent right PICA. Left PICA not seen. Basilar widely patent to its distal aspect. Apparent focal narrowing of the mid basilar artery on MIP reconstructions felt to be most consistent with artifact. Superior cerebral arteries patent bilaterally. Both posterior cerebral arteries primarily supplied via the basilar and are well perfused to their distal aspects. No appreciable intracranial aneurysm. IMPRESSION: MRI HEAD IMPRESSION: 1. Extensive and fairly symmetric multifocal areas of restricted  diffusion and signal abnormality involving the bilateral basal ganglia, hemispheric cerebral white matter, bilateral hippocampal formations and cerebellum as above. Findings are nonspecific, with primary differential consideration consisting of possible hypoperfusion/anoxic injury given history of hypotension. Possible toxic metabolic derangement would be the primary differential consideration. 2. Underlying moderately advanced cerebral atrophy with chronic small vessel ischemic disease. MRA HEAD IMPRESSION: 1. Motion degraded exam. 2. Grossly negative MRA with no large vessel occlusion. No definite hemodynamically significant or correctable stenosis. Electronically Signed   By: Jeannine Boga M.D.   On: 02/20/2019 01:35   US Renal  Result Date: 02/19/2019 CLINICAL DATA:  Acute renal failure. EXAM: RENAL / URINARY TRACT ULTRASOUND COMPLETE COMPARISON:  None. FINDINGS: Right Kidney: Renal measurements: 9.7 x 5.8 x 5.7 cm = volume: 170 mL . Echogenicity within normal limits. No mass or hydronephrosis visualized. Left Kidney: Renal measurements: 10.1 x 5.0 x 5.1 cm = volume: 137 mL. Echogenicity within normal limits. No mass or hydronephrosis visualized. Bladder: Foley catheter within a partially decompressed bladder. Other: None. IMPRESSION: Normal renal ultrasound. Electronically Signed   By: Titus Dubin M.D.   On: 02/19/2019 13:16   Dg Chest Portable 1 View  Result Date: 02/24/2019 CLINICAL DATA:  Status post intubation and NG tube placement. EXAM: PORTABLE CHEST 1 VIEW COMPARISON:  None. FINDINGS: Endotracheal tube is in place with its tip approximately 2.4 cm above the carina. NG tube tip is in the stomach in good position. Elevation of the right hemidiaphragm noted. Right basilar atelectasis. The lungs are otherwise clear. No pneumothorax or pleural effusion. Heart size is normal. IMPRESSION: ETT and NG tube in good position. Subsegmental atelectasis right lung base. Electronically Signed    By: Inge Rise M.D.   On: 02/11/2019 16:30     Assessment/Plan: 68 year old male with a history of ETOH abuse, HTN and DM found down.  Unclear period of time down.  Intubated for airway management.  Multiple metabolic abnormalities and in rhabdo.  MRI of the brain reviewed and shows multiple areas of diffusion weighted abnormality that are fairly symmetric and may suggest hypoperfusion injury and/or acute ischemic injury.  MRA is unremarkable.   Patient did require sedation this morning due to biting on the ET tube but despite this the patient does not fulfill criteria for brain death.  At best though, based on MR findings I do suspect patient will have significant disability.    Recommendations: 1. EEG 2. Liberal BP control 3. Echocardiogram 4. Frequent neuro checks 5. Telemetry 6. ASA 337m rectally, daily 7. Agree with addressing metabolic abnormalities  LAlexis Goodell MD Neurology [phone]/25/2020, 9:38 AM

## 2019-02-20 NOTE — Progress Notes (Signed)
David Blackburn  MRN: [id number]  DOB/AGE: Apr 24, 1951 68 y.o.  Primary Care Physician:Johnson, Barb Merino, DO  Admit date: 01/27/2019  Chief Complaint:  Chief Complaint  Patient presents with  . unresponsive    S-Pt presented on  02/06/2019 with  Chief Complaint  Patient presents with  . unresponsive  .    Pt is intubated unable to offer any complaints  Medication . aspirin  300 mg Rectal Daily  . atorvastatin  40 mg Oral Daily  . chlorhexidine gluconate (MEDLINE KIT)  15 mL Mouth Rinse BID  . Chlorhexidine Gluconate Cloth  6 each Topical Q0600  . folic acid  1 mg Per Tube Daily  . heparin  5,000 Units Subcutaneous Q8H  . insulin aspart  0-9 Units Subcutaneous Q4H  . mouth rinse  15 mL Mouth Rinse 10 times per day  . thiamine  100 mg Oral Daily   Or  . thiamine  100 mg Intravenous Daily         ROS: Unable to get all systems reviewed.  Physical Exam: Vital signs in last 24 hours: Temp:  [99.1 F (37.3 C)-100 F (37.8 C)] 99.5 F (37.5 C) (10/25 0800) Pulse Rate:  [67-85] 77 (10/25 0800) Resp:  [13-22] 17 (10/25 0800) BP: (116-179)/(74-104) 145/90 (10/25 0800) SpO2:  [94 %-98 %] 98 % (10/25 0800) FiO2 (%):  [30 %] 30 % (10/25 1059) Weight:  [88.9 kg] 88.9 kg (10/25 0500) Weight change: 0.9 kg Last BM Date: (PTA)  Intake/Output from previous day: 10/24 0701 - 10/25 0700 In: 2770.5 [I.V.:2440.5; NG/GT:30; IV Piggyback:300] Out: 1200 [Urine:1200] Total I/O In: 100 [IV Piggyback:100] Out: -    Physical Exam: General- pt is intubated Resp-ET tube in situ, bilateral breath sounds  NO Rhonchi CVS- S1S2 regular in rate and rhythm GIT- BS+, soft, NT, ND EXT- NO LE Edema, no cyanosis   Lab Results: CBC Recent Labs    02/19/19 0415 02/20/19 0427  WBC 10.5 10.2  HGB 14.4 12.6*  HCT 44.6 39.0  PLT 250 194    BMET Recent Labs    02/19/19 0415 02/20/19 0427  NA 140 144  K 4.1 3.8  CL 110 110  CO2 23 28  GLUCOSE 213* 140*  BUN 31* 17   CREATININE 1.74* 1.16  CALCIUM 7.9* 8.0*    Creat trend 2020   3.1=> 1.74==> 1.16 2016   1.4    Potassium > 7.5==> 4.1==> 3.8  CO2 18==> 24  MICRO Recent Results (from the past 240 hour(s))  SARS Coronavirus 2 by RT PCR (hospital order, performed in [facility] hospital lab) Nasopharyngeal Urine, Catheterized     Status: None   Collection Time: 02/19/2019  3:40 PM   Specimen: Urine, Catheterized; Nasopharyngeal  Result Value Ref Range Status   SARS Coronavirus 2 NEGATIVE NEGATIVE Final    Comment: (NOTE) If result is NEGATIVE SARS-CoV-2 target nucleic acids are NOT DETECTED. The SARS-CoV-2 RNA is generally detectable in upper and lower  respiratory specimens during the acute phase of infection. The lowest  concentration of SARS-CoV-2 viral copies this assay can detect is 250  copies / mL. A negative result does not preclude SARS-CoV-2 infection  and should not be used as the sole basis for treatment or other  patient management decisions.  A negative result may occur with  improper specimen collection / handling, submission of specimen other  than nasopharyngeal swab, presence of viral mutation(s) within the  areas targeted by this assay, and inadequate number of  viral copies  (<250 copies / mL). A negative result must be combined with clinical  observations, patient history, and epidemiological information. If result is POSITIVE SARS-CoV-2 target nucleic acids are DETECTED. The SARS-CoV-2 RNA is generally detectable in upper and lower  respiratory specimens dur ing the acute phase of infection.  Positive  results are indicative of active infection with SARS-CoV-2.  Clinical  correlation with patient history and other diagnostic information is  necessary to determine patient infection status.  Positive results do  not rule out bacterial infection or co-infection with other viruses. If result is PRESUMPTIVE POSTIVE SARS-CoV-2 nucleic acids MAY BE PRESENT.   A  presumptive positive result was obtained on the submitted specimen  and confirmed on repeat testing.  While 2019 novel coronavirus  (SARS-CoV-2) nucleic acids may be present in the submitted sample  additional confirmatory testing may be necessary for epidemiological  and / or clinical management purposes  to differentiate between  SARS-CoV-2 and other Sarbecovirus currently known to infect humans.  If clinically indicated additional testing with an alternate test  methodology [phone]) is advised. The SARS-CoV-2 RNA is generally  detectable in upper and lower respiratory sp ecimens during the acute  phase of infection. The expected result is Negative. Fact Sheet for Patients:  StrictlyIdeas.no Fact Sheet for Healthcare Providers: BankingDealers.co.za This test is not yet approved or cleared by the [place] FDA and has been authorized for detection and/or diagnosis of SARS-CoV-2 by FDA under an Emergency Use Authorization (EUA).  This EUA will remain in effect (meaning this test can be used) for the duration of the COVID-19 declaration under Section 564(b)(1) of the Act, 21 U.S.C. section 360bbb-3(b)(1), unless the authorization is terminated or revoked sooner. Performed at [facility], [address] [place], [place] [address]   Urine Culture     Status: None   Collection Time: 02/03/2019  3:40 PM   Specimen: Urine, Random  Result Value Ref Range Status   Specimen Description   Final    URINE, RANDOM Performed at [facility], [address] [place] [address] [place], [place] [address]    Special Requests   Final    NONE Performed at [facility], [address], [place], [place] [address]    Culture   Final    NO GROWTH Performed at [facility], [place] [address], [place], [place] [address]    Report Status 02/20/2019 FINAL  Final  Blood culture (routine x 2)     Status: None (Preliminary result)    Collection Time: 01/27/2019  7:19 PM   Specimen: BLOOD  Result Value Ref Range Status   Specimen Description BLOOD BLOOD LEFT HAND  Final   Special Requests   Final    BOTTLES DRAWN AEROBIC AND ANAEROBIC Blood Culture adequate volume   Culture   Final    NO GROWTH 2 DAYS Performed at [facility], [address] [place], [place] [address]    Report Status PENDING  Incomplete  Blood culture (routine x 2)     Status: None (Preliminary result)   Collection Time: 02/17/2019  7:20 PM   Specimen: BLOOD  Result Value Ref Range Status   Specimen Description BLOOD BLOOD RIGHT HAND  Final   Special Requests   Final    BOTTLES DRAWN AEROBIC AND ANAEROBIC Blood Culture adequate volume   Culture   Final    NO GROWTH 2 DAYS Performed at [facility], [address] [place], [place] [address]  Report Status PENDING  Incomplete  MRSA PCR Screening     Status: None   Collection Time: 02/02/2019  8:50 PM   Specimen: Nasal Mucosa; Nasopharyngeal  Result Value Ref Range Status   MRSA by PCR NEGATIVE NEGATIVE Final    Comment:        The GeneXpert MRSA Assay (FDA approved for NASAL specimens only), is one component of a comprehensive MRSA colonization surveillance program. It is not intended to diagnose MRSA infection nor to guide or monitor treatment for MRSA infections. Performed at [facility], [address] [place], [place] [address]   Culture, respiratory (non-expectorated)     Status: None (Preliminary result)   Collection Time: 02/19/19  2:59 AM   Specimen: Tracheal Aspirate; Respiratory  Result Value Ref Range Status   Specimen Description   Final    TRACHEAL ASPIRATE Performed at [facility], [address] [place], [place] [address]    Special Requests   Final    NONE Performed at [facility], [place], [place] [address]    Gram Stain   Final    RARE WBC PRESENT, PREDOMINANTLY PMN ABUNDANT GRAM POSITIVE  COCCI RARE GRAM POSITIVE RODS Performed at [facility], [place] [address] [place], [place] [address]    Culture PENDING  Incomplete   Report Status PENDING  Incomplete      Lab Results  Component Value Date   CALCIUM 8.0 (L) 02/20/2019   PHOS 1.7 (L) 02/20/2019         Impression:  Mr. David Blackburn is a 68 y.o. white male with diabetes mellitus type Blackburn, hypertension, alcohol abuse, substance abuse, Bipolar disorder who was admitted to [facility] on 01/27/2019 for unresponsive   1)Renal  AKI secondary to prerenal/ATN                AKI secondary to hypovolemia/hypotension                AKI now better                Creatinine trending down                Adequate urine output  2)Respiratory failure  Pt was  intubated for airway protection. Remains intubated  3)Anemia hemoglobin is trending low   4)CKD Mineral-Bone Disorder Phosphorus at goal.   5) hypotension      Patient was on pressors     Now hemodynamically better  6) hyperkalemia Now  better  7) acidosis Now better  8) aspiration pneumonia     Patient is on IV antibiotics      WBC counts are trending down   9) rhabdomyolysis     CPK levels now much better   Plan:  AKI now better Will sign off Please call if any query.     Tasheka Houseman s Theador Hawthorne 02/20/2019, 12:00 PM

## 2019-02-20 NOTE — Progress Notes (Signed)
[employer] - [place] [facility]   PATIENT NAME: David Blackburn    MR#:  [id number]  DATE OF BIRTH:  Jun 15, 1950  SUBJECTIVE:  CHIEF COMPLAINT:   Chief Complaint  Patient presents with  . unresponsive   Intubated and sedated. On full vent support Afebrile  REVIEW OF SYSTEMS:    Review of Systems  Unable to perform ROS: Intubated   DRUG ALLERGIES:  No Known Allergies  VITALS:  Blood pressure (!) 145/90, pulse 77, temperature 99.5 F (37.5 C), temperature source Axillary, resp. rate 17, height 6' (1.829 m), weight 88.9 kg, SpO2 98 %.  PHYSICAL EXAMINATION:   Physical Exam  GENERAL:  68 y.o.-year-old patient lying in the bed.  EYES: Pupils equal, round, reactive to light and accommodation. No scleral icterus. Extraocular muscles intact.  HEENT: Head atraumatic, normocephalic. Oropharynx and nasopharynx clear. ETT NECK:  Supple, no jugular venous distention. No thyroid enlargement, no tenderness.  LUNGS: Normal breath sounds bilaterally, no wheezing, rales, rhonchi. No use of accessory muscles of respiration.  CARDIOVASCULAR: S1, S2 normal. No murmurs, rubs, or gallops.  ABDOMEN: Soft, nontender, nondistended. Bowel sounds present. No organomegaly or mass.  EXTREMITIES: No cyanosis, clubbing or edema b/l.    NEUROLOGIC: sedated PSYCHIATRIC: The patient is sedated SKIN: No obvious rash, lesion, or ulcer.   LABORATORY PANEL:   CBC Recent Labs  Lab 02/20/19 0427  WBC 10.2  HGB 12.6*  HCT 39.0  PLT 194   ------------------------------------------------------------------------------------------------------------------ Chemistries  Recent Labs  Lab 02/20/19 0427  NA 144  K 3.8  CL 110  CO2 28  GLUCOSE 140*  BUN 17  CREATININE 1.16  CALCIUM 8.0*  MG 2.0  AST 272*  ALT 107*  ALKPHOS 51  BILITOT 1.0   ------------------------------------------------------------------------------------------------------------------  Cardiac Enzymes No  results for input(s): TROPONINI in the last 168 hours. ------------------------------------------------------------------------------------------------------------------  RADIOLOGY:  Dg Eye Foreign Body  Result Date: 02/19/2019 CLINICAL DATA:  Metal working/exposure; clearance prior to MRI EXAM: ORBITS FOR FOREIGN BODY - 11 VIEW COMPARISON:  None. FINDINGS: Water's view obtained. No intraorbital radiopaque foreign body. Visualized paranasal sinuses clear. No fracture or dislocation. IMPRESSION: No evidence of metallic foreign body within the orbits on single waters view. Electronically Signed   By: Bretta Bang III M.D.   On: 02/19/2019 15:55   Dg Abd 1 View  Result Date: 02/19/2019 CLINICAL DATA:  Nasogastric tube placement EXAM: ABDOMEN - 1 VIEW COMPARISON:  None. FINDINGS: Nasogastric tube tip and side port are in the stomach. There is moderate stool in the colon. There is no bowel dilatation or air-fluid level to suggest bowel obstruction. No free air. There is degenerative change in the lumbar spine. 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 There is iliac artery atherosclerosis, primarily on the right. IMPRESSION: Nasogastric tube tip and side port in stomach. No bowel obstruction or free air. 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 Electronically Signed   By: Bretta Bang III M.D.   On: 02/19/2019 15:56   Ct Head Wo Contrast  Result Date: 01/31/2019 CLINICAL DATA:  Altered LOC EXAM: CT HEAD WITHOUT CONTRAST TECHNIQUE: Contiguous axial images were obtained from the base of the skull through the vertex without intravenous contrast. COMPARISON:  None. FINDINGS: Brain: No acute territorial infarction, hemorrhage, or intracranial mass. Moderate atrophy. Moderate hypodensity in the white matter, consistent with chronic small vessel ischemic change. Prominent ventricles  felt secondary to atrophy. Vascular: No hyperdense vessels.  Carotid vascular calcification. Skull: Normal. Negative for fracture or focal lesion. Sinuses/Orbits: No acute finding. Other: None IMPRESSION: 1. No CT evidence for acute intracranial abnormality. 2. Atrophy and small vessel ischemic changes of the white matter. Electronically Signed   By: Jasmine PangKim  Fujinaga M.D.   On: 05/05/2018 16:34   Mr Angio Head Wo Contrast  Result Date: 02/20/2019 CLINICAL DATA:  Initial evaluation for acute altered mental status, found down, hypotensive. EXAM: MRI HEAD WITHOUT CONTRAST MRA HEAD WITHOUT CONTRAST TECHNIQUE: Multiplanar, multiecho pulse sequences of the brain and surrounding structures were obtained without intravenous contrast. Angiographic images of the head were obtained using MRA technique without contrast. COMPARISON:  Prior CT from 05/05/2018. FINDINGS: MRI HEAD FINDINGS Brain: Examination mildly degraded by motion artifact. Diffuse prominence of the CSF containing spaces compatible with generalized cerebral atrophy. Patchy and confluent T2/FLAIR hyperintensity within the periventricular and deep white matter both cerebral hemispheres most consistent chronic small vessel ischemic disease, moderate in nature. There is abnormal symmetric restricted diffusion involving the globus pallidi bilaterally. Mild scattered patchy involvement of the adjacent basal ganglia and internal capsules. Scattered multifocal patchy foci of restricted diffusion seen involving the periventricular, deep, and subcortical white matter of both cerebral hemispheres, also fairly symmetric in nature. Symmetric restricted diffusion also seen involving the hippocampal formations bilaterally. Patchy restricted diffusion also seen involving the cerebellar hemispheres bilaterally, left greater than right. Associated T2/FLAIR hyperintensity throughout the areas affected without significant mass effect. Few scattered foci of susceptibility artifact  seen within the areas affected, most notable at the globus pallidi and posterior hippocampal formations, consistent with associated petechial hemorrhage. Given history of hypertension, findings favored to reflect a hypoperfusion/anoxic insult. No other evidence for acute vascular infarct. Gray-white matter differentiation otherwise maintained. No encephalomalacia to suggest chronic cortical infarction. No other evidence for acute or chronic intracranial hemorrhage. No mass lesion, midline shift or mass effect. No hydrocephalus. Pituitary gland normal. No extra-axial fluid collection. Vascular: Major intracranial vascular flow voids are maintained. Skull and upper cervical spine: Craniocervical junction normal. Bone marrow signal intensity within normal limits. No scalp soft tissue abnormality. Sinuses/Orbits: Globes and orbital soft tissues within normal limits. Scattered mucosal thickening throughout the paranasal sinuses. Fluid seen layering within the nasopharynx. Small bilateral mastoid effusions. Patient likely intubated. Other: None. MRA HEAD FINDINGS ANTERIOR CIRCULATION: Examination moderately degraded by motion artifact. Distal cervical segments of the internal carotid arteries are patent with symmetric antegrade flow. Petrous, cavernous, and supraclinoid segments patent without obvious high-grade stenosis or other abnormality. A1 segments patent. Grossly normal anterior communicating artery complex. Anterior cerebral arteries widely patent to their distal aspects. No M1 stenosis or occlusion. Grossly normal MCA bifurcations. Distal MCA branches well perfused and fairly symmetric. POSTERIOR CIRCULATION: Vertebral arteries patent to the vertebrobasilar junction without appreciable stenosis. Patent right PICA. Left PICA not seen. Basilar widely patent to its distal aspect. Apparent focal narrowing of the mid basilar artery on MIP reconstructions felt to be most consistent with artifact. Superior cerebral  arteries patent bilaterally. Both posterior cerebral arteries primarily supplied via the basilar and are well perfused to their distal aspects. No appreciable intracranial aneurysm. IMPRESSION: MRI HEAD IMPRESSION: 1. Extensive and fairly symmetric multifocal areas of restricted diffusion and signal abnormality involving the bilateral basal ganglia, hemispheric cerebral white matter, bilateral hippocampal formations and cerebellum as above. Findings are nonspecific, with primary differential consideration consisting of possible hypoperfusion/anoxic injury given history of hypotension. Possible toxic metabolic derangement would be the  primary differential consideration. 2. Underlying moderately advanced cerebral atrophy with chronic small vessel ischemic disease. MRA HEAD IMPRESSION: 1. Motion degraded exam. 2. Grossly negative MRA with no large vessel occlusion. No definite hemodynamically significant or correctable stenosis. Electronically Signed   By: Rise Mu M.D.   On: 02/20/2019 01:35   Mr Brain Wo Contrast  Result Date: 02/20/2019 CLINICAL DATA:  Initial evaluation for acute altered mental status, found down, hypotensive. EXAM: MRI HEAD WITHOUT CONTRAST MRA HEAD WITHOUT CONTRAST TECHNIQUE: Multiplanar, multiecho pulse sequences of the brain and surrounding structures were obtained without intravenous contrast. Angiographic images of the head were obtained using MRA technique without contrast. COMPARISON:  Prior CT from 02/24/2019. FINDINGS: MRI HEAD FINDINGS Brain: Examination mildly degraded by motion artifact. Diffuse prominence of the CSF containing spaces compatible with generalized cerebral atrophy. Patchy and confluent T2/FLAIR hyperintensity within the periventricular and deep white matter both cerebral hemispheres most consistent chronic small vessel ischemic disease, moderate in nature. There is abnormal symmetric restricted diffusion involving the globus pallidi bilaterally. Mild  scattered patchy involvement of the adjacent basal ganglia and internal capsules. Scattered multifocal patchy foci of restricted diffusion seen involving the periventricular, deep, and subcortical white matter of both cerebral hemispheres, also fairly symmetric in nature. Symmetric restricted diffusion also seen involving the hippocampal formations bilaterally. Patchy restricted diffusion also seen involving the cerebellar hemispheres bilaterally, left greater than right. Associated T2/FLAIR hyperintensity throughout the areas affected without significant mass effect. Few scattered foci of susceptibility artifact seen within the areas affected, most notable at the globus pallidi and posterior hippocampal formations, consistent with associated petechial hemorrhage. Given history of hypertension, findings favored to reflect a hypoperfusion/anoxic insult. No other evidence for acute vascular infarct. Gray-white matter differentiation otherwise maintained. No encephalomalacia to suggest chronic cortical infarction. No other evidence for acute or chronic intracranial hemorrhage. No mass lesion, midline shift or mass effect. No hydrocephalus. Pituitary gland normal. No extra-axial fluid collection. Vascular: Major intracranial vascular flow voids are maintained. Skull and upper cervical spine: Craniocervical junction normal. Bone marrow signal intensity within normal limits. No scalp soft tissue abnormality. Sinuses/Orbits: Globes and orbital soft tissues within normal limits. Scattered mucosal thickening throughout the paranasal sinuses. Fluid seen layering within the nasopharynx. Small bilateral mastoid effusions. Patient likely intubated. Other: None. MRA HEAD FINDINGS ANTERIOR CIRCULATION: Examination moderately degraded by motion artifact. Distal cervical segments of the internal carotid arteries are patent with symmetric antegrade flow. Petrous, cavernous, and supraclinoid segments patent without obvious high-grade  stenosis or other abnormality. A1 segments patent. Grossly normal anterior communicating artery complex. Anterior cerebral arteries widely patent to their distal aspects. No M1 stenosis or occlusion. Grossly normal MCA bifurcations. Distal MCA branches well perfused and fairly symmetric. POSTERIOR CIRCULATION: Vertebral arteries patent to the vertebrobasilar junction without appreciable stenosis. Patent right PICA. Left PICA not seen. Basilar widely patent to its distal aspect. Apparent focal narrowing of the mid basilar artery on MIP reconstructions felt to be most consistent with artifact. Superior cerebral arteries patent bilaterally. Both posterior cerebral arteries primarily supplied via the basilar and are well perfused to their distal aspects. No appreciable intracranial aneurysm. IMPRESSION: MRI HEAD IMPRESSION: 1. Extensive and fairly symmetric multifocal areas of restricted diffusion and signal abnormality involving the bilateral basal ganglia, hemispheric cerebral white matter, bilateral hippocampal formations and cerebellum as above. Findings are nonspecific, with primary differential consideration consisting of possible hypoperfusion/anoxic injury given history of hypotension. Possible toxic metabolic derangement would be the primary differential consideration. 2. Underlying moderately advanced cerebral atrophy  with chronic small vessel ischemic disease. MRA HEAD IMPRESSION: 1. Motion degraded exam. 2. Grossly negative MRA with no large vessel occlusion. No definite hemodynamically significant or correctable stenosis. Electronically Signed   By: Jeannine Boga M.D.   On: 02/20/2019 01:35   US Renal  Result Date: 02/19/2019 CLINICAL DATA:  Acute renal failure. EXAM: RENAL / URINARY TRACT ULTRASOUND COMPLETE COMPARISON:  None. FINDINGS: Right Kidney: Renal measurements: 9.7 x 5.8 x 5.7 cm = volume: 170 mL . Echogenicity within normal limits. No mass or hydronephrosis visualized. Left Kidney:  Renal measurements: 10.1 x 5.0 x 5.1 cm = volume: 137 mL. Echogenicity within normal limits. No mass or hydronephrosis visualized. Bladder: Foley catheter within a partially decompressed bladder. Other: None. IMPRESSION: Normal renal ultrasound. Electronically Signed   By: Titus Dubin M.D.   On: 02/19/2019 13:16   Dg Chest Portable 1 View  Result Date: 02/05/2019 CLINICAL DATA:  Status post intubation and NG tube placement. EXAM: PORTABLE CHEST 1 VIEW COMPARISON:  None. FINDINGS: Endotracheal tube is in place with its tip approximately 2.4 cm above the carina. NG tube tip is in the stomach in good position. Elevation of the right hemidiaphragm noted. Right basilar atelectasis. The lungs are otherwise clear. No pneumothorax or pleural effusion. Heart size is normal. IMPRESSION: ETT and NG tube in good position. Subsegmental atelectasis right lung base. Electronically Signed   By: Inge Rise M.D.   On: 02/26/2019 16:30     ASSESSMENT AND PLAN:   68 y.o. male  with pertinent past medical history of EtOH abuse, diabetes mellitus, hypertension, psoriasis, sciatica, mood disorder with suicidal ideation and tobacco abuse presenting to the ED with unresponsiveness.  1. Acute respiratory failure with hypoxia with aspiration pneumonia Primarily intubated to protect airway Continue full ventilatory support.  Appreciate ICU help.  2. Acute rhabdomyolysis with acute kidney injury Trend CK levels.   Improving Repeat labs in a.m.  3. Elevated troponin - likely due to rhabdomyolysis -Trend troponin - Echocardiogram no wall motion abnormalities.  EF of 60%  4.   Septic shock secondary to aspiration pneumonia Levophed drip  5. Type 2 diabetes mellitus - SSI - Glycemic control per ICU protocol  6. EtOH Abuse -high risk for withdrawal CIWA protocol if needed  7.  MRI with anoxic brain injury versus metabolic derangement changes Overall poor prognosis.  Have to see how patient does  once off sedation and extubated  All the records are reviewed and case discussed with Care Management/Social Worker Management plans discussed with the patient, family and they are in agreement.  CODE STATUS: FULL CODE  TOTAL TIME TAKING CARE OF THIS PATIENT: 35 minutes.   Leia Alf Jaqualin Serpa M.D on 02/20/2019 at 11:22 AM  Between 7am to 6pm - Pager - [phone]  After 6pm go to [email or web address] - password [email or web address] [facility]  Office  [phone]  CC: Primary care physician; Valerie Roys, DO  Note: This dictation was prepared with Dragon dictation along with smaller phrase technology. Any transcriptional errors that result from this process are unintentional.

## 2019-02-20 NOTE — Progress Notes (Signed)
Name: David Blackburn MRN: [id number] DOB: 1950/07/07    ADMISSION DATE:  02/04/2019 CONSULTATION DATE: 02/26/2019  REFERRING MD : Rufina Falco, NP  CHIEF COMPLAINT: Unresponsiveness   BRIEF PATIENT DESCRIPTION:  68 yo male with hx of ETOH abuse admitted with acute encephalopathy, acute renal failure with hyperkalemia, and acute respiratory failure secondary to severe metabolic acidosis and possible aspiration pneumonia requiring mechanical intubation   SIGNIFICANT EVENTS/STUDIES:  10/23: Pt admitted to ICU mechanically intubated  10/23: CT Head revealed no CT evidence for acute intracranial abnormality. Atrophy and small vessel ischemic changes of the white matter 10/24  Moving to sternal rub without sedation, not following verbal communication  10/25 - remains unresponsive - s/p MRI brain - extensive symmetric diffusion and signal abnormality, s/p neuro eval - significant disability expected at best case scenario.    HISTORY OF PRESENT ILLNESS:   This is a 68 yo male with PMH of Substance Induced Mood Disorder, Sciatica, Psoriasis, HTN, Type Blackburn Diabetes Mellitus, Dental Abscess, and Alcohol Abuse.  He presented to [facility] ER via EMS on 10/23 after being found unresponsivene.  Per ER notes it was reported the pt drank alcohol with his friend the night of 10/22 and later on during the night he was dropped off at another friends house to "sleep off" the intoxication.  However, he was found the morning of 10/23 laying on the ground near a vehicle unresponsive prompting EMS notification.  Upon EMS arrival pt remained unresponsive with sbp in the 80's, he received narcan x3 doses en route to the ER without improvement in mentation. EMS EKG revealed possible small ST elevation in the inferior leads, no reciprocal changes, and no ischemic findings. Upon arrival to the ER pts O2 sats were 95% on 4L, however he remained unresponsive with snoring respirations requiring mechanical intubation.  CT Head  negative for acute intracranial abnormality.  Lab results revealed K+ >7.5, CO2 21, glucose 146, BUN 36, creatinine 3.11, AST 108, ALT 63, troponin 501, lactic acid 3.1, wbc 14.7, acetaminophen level <03, salicylate level <2.1, toxicology positive for benzo's, UA negative for UTI, and abg ph 7.19/pCO2 39/pO2 178/bicarb 14.9. Repeat EKG revealed slight T wave elevation and normal QRS consistent with hyperkalemia.  He received 10 units iv novolog, 1 amp D50W, and sodium bicarb gtt initiated.  COVID-19 negative, however CXR concerning for atelectasis of right lung.  Pt received cefepime, vancomycin, and 2L NS bolus.  He was subsequently admitted to ICU per hospitalist team for additional workup and treatment.    PAST MEDICAL HISTORY :   has a past medical history of Alcohol abuse (01/30/2017), Dental abscess (08/29/2014), Diabetes mellitus without complication ([place]), Hypertension, Psoriasis, Sciatica, and Substance induced mood disorder ([place]) (01/30/2017).  has a past surgical history that includes Tonsillectomy (1961) and Vasectomy (1980). Prior to Admission medications   Medication Sig Start Date End Date Taking? Authorizing Provider  atorvastatin (LIPITOR) 40 MG tablet Take 1 tablet (40 mg total) by mouth daily. 11/18/18  Yes Johnson, Megan P, DO  cholecalciferol (VITAMIN D3) 25 MCG (1000 UT) tablet Take 1,000 Units by mouth daily.   Yes [provider]  folic acid (FOLVITE) 224 MCG tablet Take 400 mcg by mouth daily.   Yes [provider]  Inositol Niacinate (NIACIN FLUSH FREE) 500 MG CAPS Take 500 mg by mouth daily.   Yes [provider]  losartan (COZAAR) 50 MG tablet Take 1 tablet (50 mg total) by mouth daily. Patient taking differently: Take 50 mg by  mouth at bedtime.  11/18/18  Yes Johnson, Megan P, DO  metFORMIN (GLUCOPHAGE-XR) 500 MG 24 hr tablet TAKE 1 TABLET BY MOUTH ONCE DAILY WITH BREAKFAST Patient taking differently: Take 500 mg by mouth daily with breakfast.  11/18/18   Yes Johnson, Megan P, DO  Multiple Vitamin (MULTIVITAMIN) tablet Take 1 tablet by mouth daily.   Yes [provider]  naproxen (NAPROSYN) 500 MG tablet TAKE 1 TABLET BY MOUTH TWICE DAILY WITH MEALS Patient taking differently: Take 500 mg by mouth 2 (two) times daily with a meal.  12/28/18  Yes Johnson, Megan P, DO  QUEtiapine (SEROQUEL XR) 50 MG TB24 24 hr tablet Take 2 tablets (100 mg total) by mouth at bedtime. 11/18/18  Yes Johnson, Megan P, DO   No Known Allergies  FAMILY HISTORY:  family history includes Cancer in his brother and paternal grandfather; Heart disease in his maternal grandfather; Hypertension in his mother; Lung cancer in his mother; Stroke in his maternal grandmother. SOCIAL HISTORY:  reports that he quit smoking about 3 years ago. His smoking use included cigars. He has a 40.00 pack-year smoking history. He has quit using smokeless tobacco. He reports current alcohol use of about 21.0 standard drinks of alcohol per week. He reports previous drug use. Drug: Marijuana.  REVIEW OF SYSTEMS:   Unable to assess pt intubated   SUBJECTIVE:  Unable to assess pt intubated   VITAL SIGNS: Temp:  [99.3 F (37.4 C)-100 F (37.8 C)] 99.6 F (37.6 C) (10/25 1200) Pulse Rate:  [67-85] 70 (10/25 1200) Resp:  [13-22] 16 (10/25 1200) BP: (116-179)/(74-104) 124/79 (10/25 1200) SpO2:  [94 %-98 %] 96 % (10/25 1200) FiO2 (%):  [30 %] 30 % (10/25 1200) Weight:  [88.9 kg] 88.9 kg (10/25 0500)  PHYSICAL EXAMINATION: General: acutely ill appearing male, NAD mechanically intubated  Neuro: sedated, not following commands, bilateral pupils 2 mm sluggish   HEENT: supple, no JVD  Cardiovascular: nsr, rrr, no R/G  Lungs: faint rhonchi throughout, even, non labored  Abdomen: +BS x4, soft, non distended  Musculoskeletal: normal bulk and tone, no edema  Skin: scattered psoriasis rash on back and bilateral lower extremities   Recent Labs  Lab 02/03/2019 2123 02/19/19 0415  02/20/19 0427  NA 142 140 144  K 5.6* 4.1 3.8  CL 108 110 110  CO2 20* 23 28  BUN 36* 31* 17  CREATININE 2.73* 1.74* 1.16  GLUCOSE 192* 213* 140*   Recent Labs  Lab 01/30/2019 1540 02/19/19 0415 02/20/19 0427  HGB 14.8 14.4 12.6*  HCT 47.0 44.6 39.0  WBC 14.7* 10.5 10.2  PLT 375 250 194   Dg Eye Foreign Body  Result Date: 02/19/2019 CLINICAL DATA:  Metal working/exposure; clearance prior to MRI EXAM: ORBITS FOR FOREIGN BODY - 11 VIEW COMPARISON:  None. FINDINGS: Water's view obtained. No intraorbital radiopaque foreign body. Visualized paranasal sinuses clear. No fracture or dislocation. IMPRESSION: No evidence of metallic foreign body within the orbits on single waters view. Electronically Signed   By: Lowella Grip III M.D.   On: 02/19/2019 15:55   Dg Abd 1 View  Result Date: 02/19/2019 CLINICAL DATA:  Nasogastric tube placement EXAM: ABDOMEN - 1 VIEW COMPARISON:  None. FINDINGS: Nasogastric tube tip and side port are in the stomach. There is moderate stool in the colon. There is no bowel dilatation or air-fluid level to suggest bowel obstruction. No free air. There is degenerative change in the lumbar spine. 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 There is iliac artery atherosclerosis, primarily on the right. IMPRESSION: Nasogastric tube tip and side port in stomach. No bowel obstruction or free air. 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 Electronically Signed   By: Lowella Grip III M.D.   On: 02/19/2019 15:56   Ct Head Wo Contrast  Result Date: 02/13/2019 CLINICAL DATA:  Altered LOC EXAM: CT HEAD WITHOUT CONTRAST TECHNIQUE: Contiguous axial images were obtained from the base of the skull through the vertex without intravenous contrast. COMPARISON:  None. FINDINGS: Brain: No acute territorial infarction, hemorrhage, or intracranial mass. Moderate  atrophy. Moderate hypodensity in the white matter, consistent with chronic small vessel ischemic change. Prominent ventricles felt secondary to atrophy. Vascular: No hyperdense vessels.  Carotid vascular calcification. Skull: Normal. Negative for fracture or focal lesion. Sinuses/Orbits: No acute finding. Other: None IMPRESSION: 1. No CT evidence for acute intracranial abnormality. 2. Atrophy and small vessel ischemic changes of the white matter. Electronically Signed   By: Donavan Foil M.D.   On: 01/29/2019 16:34   Mr Angio Head Wo Contrast  Result Date: 02/20/2019 CLINICAL DATA:  Initial evaluation for acute altered mental status, found down, hypotensive. EXAM: MRI HEAD WITHOUT CONTRAST MRA HEAD WITHOUT CONTRAST TECHNIQUE: Multiplanar, multiecho pulse sequences of the brain and surrounding structures were obtained without intravenous contrast. Angiographic images of the head were obtained using MRA technique without contrast. COMPARISON:  Prior CT from 02/01/2019. FINDINGS: MRI HEAD FINDINGS Brain: Examination mildly degraded by motion artifact. Diffuse prominence of the CSF containing spaces compatible with generalized cerebral atrophy. Patchy and confluent T2/FLAIR hyperintensity within the periventricular and deep white matter both cerebral hemispheres most consistent chronic small vessel ischemic disease, moderate in nature. There is abnormal symmetric restricted diffusion involving the globus pallidi bilaterally. Mild scattered patchy involvement of the adjacent basal ganglia and internal capsules. Scattered multifocal patchy foci of restricted diffusion seen involving the periventricular, deep, and subcortical white matter of both cerebral hemispheres, also fairly symmetric in nature. Symmetric restricted diffusion also seen involving the hippocampal formations bilaterally. Patchy restricted diffusion also seen involving the cerebellar hemispheres bilaterally, left greater than right. Associated  T2/FLAIR hyperintensity throughout the areas affected without significant mass effect. Few scattered foci of susceptibility artifact seen within the areas affected, most notable at the globus pallidi and posterior hippocampal formations, consistent with associated petechial hemorrhage. Given history of hypertension, findings favored to reflect a hypoperfusion/anoxic insult. No other evidence for acute vascular infarct. Gray-white matter differentiation otherwise maintained. No encephalomalacia to suggest chronic cortical infarction. No other evidence for acute or chronic intracranial hemorrhage. No mass lesion, midline shift or mass effect. No hydrocephalus. Pituitary gland normal. No extra-axial fluid collection. Vascular: Major intracranial vascular flow voids are maintained. Skull and upper cervical spine: Craniocervical junction normal. Bone marrow signal intensity within normal limits. No scalp soft tissue abnormality. Sinuses/Orbits: Globes and orbital soft tissues within normal limits. Scattered mucosal thickening throughout the paranasal sinuses. Fluid seen layering within the nasopharynx. Small bilateral mastoid effusions. Patient likely intubated. Other: None. MRA HEAD FINDINGS ANTERIOR CIRCULATION: Examination moderately degraded by motion artifact. Distal cervical segments of the internal carotid arteries are patent with symmetric antegrade flow. Petrous, cavernous, and supraclinoid segments patent without obvious high-grade stenosis or other abnormality. A1 segments patent. Grossly normal anterior communicating artery complex. Anterior cerebral arteries widely patent to their distal aspects. No M1 stenosis or occlusion. Grossly normal MCA bifurcations. Distal MCA  branches well perfused and fairly symmetric. POSTERIOR CIRCULATION: Vertebral arteries patent to the vertebrobasilar junction without appreciable stenosis. Patent right PICA. Left PICA not seen. Basilar widely patent to its distal aspect.  Apparent focal narrowing of the mid basilar artery on MIP reconstructions felt to be most consistent with artifact. Superior cerebral arteries patent bilaterally. Both posterior cerebral arteries primarily supplied via the basilar and are well perfused to their distal aspects. No appreciable intracranial aneurysm. IMPRESSION: MRI HEAD IMPRESSION: 1. Extensive and fairly symmetric multifocal areas of restricted diffusion and signal abnormality involving the bilateral basal ganglia, hemispheric cerebral white matter, bilateral hippocampal formations and cerebellum as above. Findings are nonspecific, with primary differential consideration consisting of possible hypoperfusion/anoxic injury given history of hypotension. Possible toxic metabolic derangement would be the primary differential consideration. 2. Underlying moderately advanced cerebral atrophy with chronic small vessel ischemic disease. MRA HEAD IMPRESSION: 1. Motion degraded exam. 2. Grossly negative MRA with no large vessel occlusion. No definite hemodynamically significant or correctable stenosis. Electronically Signed   By: Jeannine Boga M.D.   On: 02/20/2019 01:35   Mr Brain Wo Contrast  Result Date: 02/20/2019 CLINICAL DATA:  Initial evaluation for acute altered mental status, found down, hypotensive. EXAM: MRI HEAD WITHOUT CONTRAST MRA HEAD WITHOUT CONTRAST TECHNIQUE: Multiplanar, multiecho pulse sequences of the brain and surrounding structures were obtained without intravenous contrast. Angiographic images of the head were obtained using MRA technique without contrast. COMPARISON:  Prior CT from 02/12/2019. FINDINGS: MRI HEAD FINDINGS Brain: Examination mildly degraded by motion artifact. Diffuse prominence of the CSF containing spaces compatible with generalized cerebral atrophy. Patchy and confluent T2/FLAIR hyperintensity within the periventricular and deep white matter both cerebral hemispheres most consistent chronic small vessel  ischemic disease, moderate in nature. There is abnormal symmetric restricted diffusion involving the globus pallidi bilaterally. Mild scattered patchy involvement of the adjacent basal ganglia and internal capsules. Scattered multifocal patchy foci of restricted diffusion seen involving the periventricular, deep, and subcortical white matter of both cerebral hemispheres, also fairly symmetric in nature. Symmetric restricted diffusion also seen involving the hippocampal formations bilaterally. Patchy restricted diffusion also seen involving the cerebellar hemispheres bilaterally, left greater than right. Associated T2/FLAIR hyperintensity throughout the areas affected without significant mass effect. Few scattered foci of susceptibility artifact seen within the areas affected, most notable at the globus pallidi and posterior hippocampal formations, consistent with associated petechial hemorrhage. Given history of hypertension, findings favored to reflect a hypoperfusion/anoxic insult. No other evidence for acute vascular infarct. Gray-white matter differentiation otherwise maintained. No encephalomalacia to suggest chronic cortical infarction. No other evidence for acute or chronic intracranial hemorrhage. No mass lesion, midline shift or mass effect. No hydrocephalus. Pituitary gland normal. No extra-axial fluid collection. Vascular: Major intracranial vascular flow voids are maintained. Skull and upper cervical spine: Craniocervical junction normal. Bone marrow signal intensity within normal limits. No scalp soft tissue abnormality. Sinuses/Orbits: Globes and orbital soft tissues within normal limits. Scattered mucosal thickening throughout the paranasal sinuses. Fluid seen layering within the nasopharynx. Small bilateral mastoid effusions. Patient likely intubated. Other: None. MRA HEAD FINDINGS ANTERIOR CIRCULATION: Examination moderately degraded by motion artifact. Distal cervical segments of the internal  carotid arteries are patent with symmetric antegrade flow. Petrous, cavernous, and supraclinoid segments patent without obvious high-grade stenosis or other abnormality. A1 segments patent. Grossly normal anterior communicating artery complex. Anterior cerebral arteries widely patent to their distal aspects. No M1 stenosis or occlusion. Grossly normal MCA bifurcations. Distal MCA branches well perfused and fairly symmetric. POSTERIOR CIRCULATION:  Vertebral arteries patent to the vertebrobasilar junction without appreciable stenosis. Patent right PICA. Left PICA not seen. Basilar widely patent to its distal aspect. Apparent focal narrowing of the mid basilar artery on MIP reconstructions felt to be most consistent with artifact. Superior cerebral arteries patent bilaterally. Both posterior cerebral arteries primarily supplied via the basilar and are well perfused to their distal aspects. No appreciable intracranial aneurysm. IMPRESSION: MRI HEAD IMPRESSION: 1. Extensive and fairly symmetric multifocal areas of restricted diffusion and signal abnormality involving the bilateral basal ganglia, hemispheric cerebral white matter, bilateral hippocampal formations and cerebellum as above. Findings are nonspecific, with primary differential consideration consisting of possible hypoperfusion/anoxic injury given history of hypotension. Possible toxic metabolic derangement would be the primary differential consideration. 2. Underlying moderately advanced cerebral atrophy with chronic small vessel ischemic disease. MRA HEAD IMPRESSION: 1. Motion degraded exam. 2. Grossly negative MRA with no large vessel occlusion. No definite hemodynamically significant or correctable stenosis. Electronically Signed   By: Jeannine Boga M.D.   On: 02/20/2019 01:35   US Renal  Result Date: 02/19/2019 CLINICAL DATA:  Acute renal failure. EXAM: RENAL / URINARY TRACT ULTRASOUND COMPLETE COMPARISON:  None. FINDINGS: Right Kidney: Renal  measurements: 9.7 x 5.8 x 5.7 cm = volume: 170 mL . Echogenicity within normal limits. No mass or hydronephrosis visualized. Left Kidney: Renal measurements: 10.1 x 5.0 x 5.1 cm = volume: 137 mL. Echogenicity within normal limits. No mass or hydronephrosis visualized. Bladder: Foley catheter within a partially decompressed bladder. Other: None. IMPRESSION: Normal renal ultrasound. Electronically Signed   By: Titus Dubin M.D.   On: 02/19/2019 13:16   Dg Chest Portable 1 View  Result Date: 02/13/2019 CLINICAL DATA:  Status post intubation and NG tube placement. EXAM: PORTABLE CHEST 1 VIEW COMPARISON:  None. FINDINGS: Endotracheal tube is in place with its tip approximately 2.4 cm above the carina. NG tube tip is in the stomach in good position. Elevation of the right hemidiaphragm noted. Right basilar atelectasis. The lungs are otherwise clear. No pneumothorax or pleural effusion. Heart size is normal. IMPRESSION: ETT and NG tube in good position. Subsegmental atelectasis right lung base. Electronically Signed   By: Inge Rise M.D.   On: 01/31/2019 16:30    ASSESSMENT / PLAN:  Acute respiratory failure secondary to severe metabolic acidosis and possible aspiration  Mechanical intubation  Full vent support for now-vent setting reviewed and established  SBT once all parameters met  VAP bundle implemented  Prn bronchodilator therapy    Acutely comatose state   - s/p neurology evaluation - appreciate input-significant disability expected due to abnormal MRI brain   Rhabdomyolysis - patient found down post ETOH ovedose for unknown time period  - AKI/hyperkalemia/ CK >20K improving  - IVF   - nephrology on case appreciate input  Elevated troponin secondary to demand ischemia vs. NSTEMI  Hx: HTN Continuous telemetry monitoring  Prn levophed gtt to maintain map >65 Hold outpatient antihypertensives, continue outpatient atorvastatin Trend troponin's   Acute renal failure stage III  with hyperkalemia  Lactic acidosis  Trend BMP  Replace electrolytes as indicated  Monitor UOP  Avoid nephrotoxic medications  Renal US pending  Nephrology consulted appreciate input-continue sodium bicarb gtt _0  ml/hr if acute renal failure does not improve will need CRRT   Leukocytosis concerning for possible aspiration pneumonia  Trend WBC and monitor fever curve  Trend PCT  Follow cultures  Will discontinue cefepime and start unasyn for aspiration; if MRSA PCR negative will discontinue vancomycin  Type [place] Diabetes Mellitus  CBG's q4hrs  SSI   Acute encephalopathy etiology unclear  Mechanical intubation pain/discomfort  Hx: ETOH abuse and Substance induced mood disorder  Maintain RASS goal 0 to -1 Prn versed and fentanyl to maintain RASS goal  MR Brain pending if mentation does not improve will consult neurology  Will initiate CIWA protocol WUA daily   SUP px: iv pepcid VTE px: subq heparin     Ottie Glazier, M.D.  [facility]    Critical care provider statement:    Critical care time (minutes):  35   Critical care time was exclusive of:  Separately billable procedures and  treating other patients   Critical care was necessary to treat or prevent imminent or  life-threatening deterioration of the following conditions:  acutely comatose, etoh overdose, rhabdomyolysis, AKI, hyperkalmeia, multiple comorbid conditions   Critical care was time spent personally by me on the following  activities:  Development of treatment plan with patient or surrogate,  discussions with consultants, evaluation of patient's response to  treatment, examination of patient, obtaining history from patient or  surrogate, ordering and performing treatments and interventions, ordering  and review of laboratory studies and re-evaluation of patient's condition   I assumed direction of critical care for this patient from another  provider in my  specialty: no

## 2019-02-20 NOTE — Progress Notes (Signed)
Initial Nutrition Assessment  DOCUMENTATION CODES:   Not applicable  INTERVENTION:  If patient expected to remain intubated recommend initiating Vital 1.5 Cal at 20 mL/hr and advancing by 10 mL/hr every 8 hours to goal rate of 40 mL/hr (960 mL goal daily volume) + Pro-Stat 60 mL BID per tube. Provides 1840 kcal, 125 grams of protein, 730 mL H2O daily.  Also provide Juven BID per tube once tube feeds initiated to promote wound healing. Each serving provides 90-95 kcal, 2.5 grams collagen protein, L-arginine, L-glutamine, and micronutrients necessary for wound healing.  Recommend replacing phosphorus to >/=2 mg/dL prior to initiating tube feeds due to risk of refeeding syndrome.  If tube feeds are initiated provide minimum free water flush of 30 mL Q4hrs to maintain tube patency.  Monitor magnesium, potassium, and phosphorus daily for at least 3 days, MD to replete as needed, as pt is at risk for refeeding syndrome given hx of EtOH abuse.  NUTRITION DIAGNOSIS:   Inadequate oral intake related to inability to eat as evidenced by NPO status.  GOAL:   Provide needs based on ASPEN/SCCM guidelines  MONITOR:   Vent status, Labs, Weight trends, TF tolerance, Skin, I & O's  REASON FOR ASSESSMENT:   Ventilator    ASSESSMENT:   68 year old male with PMHx of HTN, DM, psoriasis, sciatica, EtOH abuse admitted with acute encephalopathy, acute renal failure with hyperkalemia, acute respiratory failure secondary to severe metabolic acidosis and possible aspiration PNA requiring mechanical intubation on 10/23.   Patient intubated. He was not on any continuous sedation at time of RD assessment. On PRVC mode with FiO2 30% and PEEP 5 cmH2O. Abdomen soft. Last BM unknown/PTA. Concerned about patient's baseline nutritional status. His weight has been trending down over time. He was 97.2 kg on 03/08/2018. He is currently 88.9 kg. He has lost 8.3 kg (8.5% body weight) over the past year, which is not  significant but is still concerning. Suspect poor diet quality related to hx of EtOH abuse. Patient also with a stage II pressure ulcer and temporal wasting. Not enough data to meet criteria for malnutrition, but patient is at risk for being malnourished. Plan is to hold off on initiation of tube feeds today per MD. Discussed with RN.  Enteral Access: 18 Fr. OGT placed 10/23; terminates in stomach per abdominal x-ray 10/24; 69 cm at corner of mouth  MAP: 87-109 mmHg  Patient is currently intubated on ventilator support Ve: 8.4 L/min Temp (24hrs), Avg:99.6 F (37.6 C), Min:99.1 F (37.3 C), Max:100 F (37.8 C)  Propofol: N/A  Medications reviewed and include: folic acid 1 mg daily per tube, Novolog 0-9 units Q4hrs, thiamine 100 mg daily, NS at 100 mL/hr, Unasyn, famotidine, potassium phosphate 20 mmol IV once today.  Labs reviewed: CBG 118-147, Phosphorus 1.7. Last HgbA1c 6.1 on 10/23.  I/O: 1200 mL UOP Yesterday (0.6 mL/kg/hr)  NUTRITION - FOCUSED PHYSICAL EXAM:    Most Recent Value  Orbital Region  No depletion  Upper Arm Region  No depletion  Thoracic and Lumbar Region  No depletion  Buccal Region  Unable to assess  Temple Region  Moderate depletion  Clavicle Bone Region  No depletion  Clavicle and Acromion Bone Region  No depletion  Scapular Bone Region  Unable to assess  Dorsal Hand  No depletion  Patellar Region  No depletion  Anterior Thigh Region  No depletion  Posterior Calf Region  No depletion  Edema (RD Assessment)  Mild  Hair  Reviewed  Eyes  Unable to assess  Mouth  Unable to assess  Skin  Reviewed  Nails  Reviewed     Diet Order:   Diet Order            Diet NPO time specified  Diet effective now             EDUCATION NEEDS:   No education needs have been identified at this time  Skin:  Skin Assessment: Skin Integrity Issues:(stg II coccyx (3cm x 3cm))  Last BM:  Unknown/PTA  Height:   Ht Readings from Last 1 Encounters:  2019/03/20 6'  (1.829 m)   Weight:   Wt Readings from Last 1 Encounters:  02/20/19 88.9 kg   Ideal Body Weight:  80.9 kg  BMI:  Body mass index is 26.58 kg/m.  Estimated Nutritional Needs:   Kcal:  2005 (PSU 2003b w/ MSJ 1712, Ve 8.4, Tmax 37.8)  Protein:  107-133 grams (1.2-1.5 grams/kg)  Fluid:  2-2.2 L/day  Willey Blade, MS, RD, LDN Office: [phone] Pager: [phone] After Hours/Weekend Pager: [phone]

## 2019-02-20 NOTE — Progress Notes (Signed)
Pt has remained obtunded and responsive to voice-> briefly opens eyes, facial grimace, yawning & stretching. Pt localizes pain with withdrawal x 4 extremities. Not FC. Pt has remained on the vent at 30%-> ABG today. Pt has remained in NSR on cardiac monitor, BP/HR WNL. Attempted to reach son with the number listed in chart and was directed to a voicemail stating it was a  life support agency.

## 2019-02-21 ENCOUNTER — Ambulatory Visit: Payer: Medicare Other | Admitting: Family Medicine

## 2019-02-21 DIAGNOSIS — R4189 Other symptoms and signs involving cognitive functions and awareness: Secondary | ICD-10-CM | POA: Diagnosis not present

## 2019-02-21 DIAGNOSIS — G939 Disorder of brain, unspecified: Secondary | ICD-10-CM

## 2019-02-21 DIAGNOSIS — M6282 Rhabdomyolysis: Secondary | ICD-10-CM | POA: Diagnosis not present

## 2019-02-21 DIAGNOSIS — J9601 Acute respiratory failure with hypoxia: Secondary | ICD-10-CM

## 2019-02-21 LAB — RENAL FUNCTION PANEL
Albumin: 2.9 g/dL — ABNORMAL LOW (ref 3.5–5.0)
Anion gap: 8 (ref 5–15)
BUN: 14 mg/dL (ref 8–23)
CO2: 25 mmol/L (ref 22–32)
Calcium: 8.3 mg/dL — ABNORMAL LOW (ref 8.9–10.3)
Chloride: 110 mmol/L (ref 98–111)
Creatinine, Ser: 0.99 mg/dL (ref 0.61–1.24)
GFR calc Af Amer: >60 mL/min (ref >60)
GFR calc non Af Amer: >60 mL/min (ref >60)
Glucose, Bld: 108 mg/dL — ABNORMAL HIGH (ref 70–99)
Phosphorus: 2.4 mg/dL — ABNORMAL LOW (ref 2.5–4.6)
Potassium: 3.5 mmol/L (ref 3.5–5.1)
Sodium: 143 mmol/L (ref 135–145)

## 2019-02-21 LAB — GLUCOSE, CAPILLARY
Glucose-Capillary: 102 mg/dL — ABNORMAL HIGH (ref 70–99)
Glucose-Capillary: 103 mg/dL — ABNORMAL HIGH (ref 70–99)
Glucose-Capillary: 105 mg/dL — ABNORMAL HIGH (ref 70–99)
Glucose-Capillary: 110 mg/dL — ABNORMAL HIGH (ref 70–99)
Glucose-Capillary: 115 mg/dL — ABNORMAL HIGH (ref 70–99)
Glucose-Capillary: 127 mg/dL — ABNORMAL HIGH (ref 70–99)
Glucose-Capillary: 99 mg/dL (ref 70–99)

## 2019-02-21 LAB — CULTURE, RESPIRATORY W GRAM STAIN: Culture: NORMAL

## 2019-02-21 LAB — PROCALCITONIN: Procalcitonin: <0.1 ng/mL

## 2019-02-21 LAB — MYOGLOBIN, URINE: Myoglobin, Ur: 14151 ng/mL — ABNORMAL HIGH (ref 0–13)

## 2019-02-21 LAB — MAGNESIUM: Magnesium: 2 mg/dL (ref 1.7–2.4)

## 2019-02-21 MED ORDER — CHLORHEXIDINE GLUCONATE 0.12 % MT SOLN
OROMUCOSAL | Status: AC
  Filled 2019-02-21: qty 15

## 2019-02-21 MED ORDER — VITAL 1.5 CAL PO LIQD
1000.0000 mL | ORAL | Status: DC
  Administered 2019-02-21: 1000 mL

## 2019-02-21 MED ORDER — JUVEN PO PACK
1.0000 | PACK | Freq: Two times a day (BID) | ORAL | Status: DC
  Administered 2019-02-21 – 2019-02-22 (×3): 1

## 2019-02-21 MED ORDER — PRO-STAT SUGAR FREE PO LIQD
60.0000 mL | Freq: Two times a day (BID) | ORAL | Status: DC
  Administered 2019-02-21 – 2019-02-22 (×4): 60 mL

## 2019-02-21 MED ORDER — POTASSIUM & SODIUM PHOSPHATES 280-160-250 MG PO PACK
1.0000 | PACK | Freq: Three times a day (TID) | ORAL | Status: AC
  Administered 2019-02-21 (×2): 1 via ORAL
  Filled 2019-02-21 (×2): qty 1

## 2019-02-21 MED ORDER — VITAL HIGH PROTEIN PO LIQD: 1000.0000 mL | ORAL | Status: DC

## 2019-02-21 MED ORDER — POTASSIUM CHLORIDE 20 MEQ PO PACK
40.0000 meq | PACK | Freq: Once | ORAL | Status: AC
  Administered 2019-02-21: 17:00:00 40 meq via ORAL
  Filled 2019-02-21: qty 2

## 2019-02-21 NOTE — Progress Notes (Signed)
[facility] [place] [facility]   PATIENT NAME: David Blackburn    MR#:  [id number]  DATE OF BIRTH:  [birth year]  SUBJECTIVE:  CHIEF COMPLAINT:   Chief Complaint  Patient presents with  . unresponsive   Intubated , On full vent support Off sedation.  Poorly responsive.   REVIEW OF SYSTEMS:    Review of Systems  Unable to perform ROS: Intubated   DRUG ALLERGIES:  No Known Allergies  VITALS:  Blood pressure (!) 150/74, pulse 66, temperature 99.7 F (37.6 C), temperature source Oral, resp. rate 19, height 6' (1.829 m), weight 88.9 kg, SpO2 96 %.  PHYSICAL EXAMINATION:   Physical Exam  GENERAL:  68 y.o.-year-old patient lying in the bed.  EYES: Pupils equal, round, reactive to light and accommodation. No scleral icterus. Extraocular muscles intact.  HEENT: Head atraumatic, normocephalic. Oropharynx and nasopharynx clear. ETT NECK:  Supple, no jugular venous distention. No thyroid enlargement, no tenderness.  LUNGS: Normal breath sounds bilaterally, no wheezing, rales, rhonchi. No use of accessory muscles of respiration.  CARDIOVASCULAR: S1, S2 normal. No murmurs, rubs, or gallops.  ABDOMEN: Soft, nontender, nondistended. Bowel sounds present. No organomegaly or mass.  EXTREMITIES: No cyanosis, clubbing or edema b/l.    NEUROLOGIC: sedated SKIN: No obvious rash, lesion, or ulcer.   LABORATORY PANEL:   CBC Recent Labs  Lab 02/20/19 0427  WBC 10.2  HGB 12.6*  HCT 39.0  PLT 194   ------------------------------------------------------------------------------------------------------------------ Chemistries  Recent Labs  Lab 02/20/19 0427  NA 144  K 3.8  CL 110  CO2 28  GLUCOSE 140*  BUN 17  CREATININE 1.16  CALCIUM 8.0*  MG 2.0  AST 272*  ALT 107*  ALKPHOS 51  BILITOT 1.0   ------------------------------------------------------------------------------------------------------------------  Cardiac Enzymes No results for input(s):  TROPONINI in the last 168 hours. ------------------------------------------------------------------------------------------------------------------  RADIOLOGY:  Dg Eye Foreign Body  Result Date: 02/19/2019 CLINICAL DATA:  Metal working/exposure; clearance prior to MRI EXAM: ORBITS FOR FOREIGN BODY - 11 VIEW COMPARISON:  None. FINDINGS: Water's view obtained. No intraorbital radiopaque foreign body. Visualized paranasal sinuses clear. No fracture or dislocation. IMPRESSION: No evidence of metallic foreign body within the orbits on single waters view. Electronically Signed   By: Bretta Bang III M.D.   On: 02/19/2019 15:55   Dg Abd 1 View  Result Date: 02/19/2019 CLINICAL DATA:  Nasogastric tube placement EXAM: ABDOMEN - 1 VIEW COMPARISON:  None. FINDINGS: Nasogastric tube tip and side port are in the stomach. There is moderate stool in the colon. There is no bowel dilatation or air-fluid level to suggest bowel obstruction. No free air. There is degenerative change in the lumbar spine. 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 There is iliac artery atherosclerosis, primarily on the right. IMPRESSION: Nasogastric tube tip and side port in stomach. No bowel obstruction or free air. 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 Electronically Signed   By: Bretta Bang III M.D.   On: 02/19/2019 15:56   Mr Angio Head Wo Contrast  Result Date: 02/20/2019 CLINICAL DATA:  Initial evaluation for acute altered mental status, found down, hypotensive. EXAM: MRI HEAD WITHOUT CONTRAST MRA HEAD WITHOUT CONTRAST TECHNIQUE: Multiplanar, multiecho pulse sequences of the brain and surrounding structures were obtained without intravenous contrast. Angiographic images of the head were obtained using MRA technique without contrast. COMPARISON:  Prior CT from 02-27-19. FINDINGS: MRI  HEAD FINDINGS  Brain: Examination mildly degraded by motion artifact. Diffuse prominence of the CSF containing spaces compatible with generalized cerebral atrophy. Patchy and confluent T2/FLAIR hyperintensity within the periventricular and deep white matter both cerebral hemispheres most consistent chronic small vessel ischemic disease, moderate in nature. There is abnormal symmetric restricted diffusion involving the globus pallidi bilaterally. Mild scattered patchy involvement of the adjacent basal ganglia and internal capsules. Scattered multifocal patchy foci of restricted diffusion seen involving the periventricular, deep, and subcortical white matter of both cerebral hemispheres, also fairly symmetric in nature. Symmetric restricted diffusion also seen involving the hippocampal formations bilaterally. Patchy restricted diffusion also seen involving the cerebellar hemispheres bilaterally, left greater than right. Associated T2/FLAIR hyperintensity throughout the areas affected without significant mass effect. Few scattered foci of susceptibility artifact seen within the areas affected, most notable at the globus pallidi and posterior hippocampal formations, consistent with associated petechial hemorrhage. Given history of hypertension, findings favored to reflect a hypoperfusion/anoxic insult. No other evidence for acute vascular infarct. Gray-white matter differentiation otherwise maintained. No encephalomalacia to suggest chronic cortical infarction. No other evidence for acute or chronic intracranial hemorrhage. No mass lesion, midline shift or mass effect. No hydrocephalus. Pituitary gland normal. No extra-axial fluid collection. Vascular: Major intracranial vascular flow voids are maintained. Skull and upper cervical spine: Craniocervical junction normal. Bone marrow signal intensity within normal limits. No scalp soft tissue abnormality. Sinuses/Orbits: Globes and orbital soft tissues within normal limits. Scattered  mucosal thickening throughout the paranasal sinuses. Fluid seen layering within the nasopharynx. Small bilateral mastoid effusions. Patient likely intubated. Other: None. MRA HEAD FINDINGS ANTERIOR CIRCULATION: Examination moderately degraded by motion artifact. Distal cervical segments of the internal carotid arteries are patent with symmetric antegrade flow. Petrous, cavernous, and supraclinoid segments patent without obvious high-grade stenosis or other abnormality. A1 segments patent. Grossly normal anterior communicating artery complex. Anterior cerebral arteries widely patent to their distal aspects. No M1 stenosis or occlusion. Grossly normal MCA bifurcations. Distal MCA branches well perfused and fairly symmetric. POSTERIOR CIRCULATION: Vertebral arteries patent to the vertebrobasilar junction without appreciable stenosis. Patent right PICA. Left PICA not seen. Basilar widely patent to its distal aspect. Apparent focal narrowing of the mid basilar artery on MIP reconstructions felt to be most consistent with artifact. Superior cerebral arteries patent bilaterally. Both posterior cerebral arteries primarily supplied via the basilar and are well perfused to their distal aspects. No appreciable intracranial aneurysm. IMPRESSION: MRI HEAD IMPRESSION: 1. Extensive and fairly symmetric multifocal areas of restricted diffusion and signal abnormality involving the bilateral basal ganglia, hemispheric cerebral white matter, bilateral hippocampal formations and cerebellum as above. Findings are nonspecific, with primary differential consideration consisting of possible hypoperfusion/anoxic injury given history of hypotension. Possible toxic metabolic derangement would be the primary differential consideration. 2. Underlying moderately advanced cerebral atrophy with chronic small vessel ischemic disease. MRA HEAD IMPRESSION: 1. Motion degraded exam. 2. Grossly negative MRA with no large vessel occlusion. No definite  hemodynamically significant or correctable stenosis. Electronically Signed   By: Jeannine Boga M.D.   On: 02/20/2019 01:35   Mr Brain Wo Contrast  Result Date: 02/20/2019 CLINICAL DATA:  Initial evaluation for acute altered mental status, found down, hypotensive. EXAM: MRI HEAD WITHOUT CONTRAST MRA HEAD WITHOUT CONTRAST TECHNIQUE: Multiplanar, multiecho pulse sequences of the brain and surrounding structures were obtained without intravenous contrast. Angiographic images of the head were obtained using MRA technique without contrast. COMPARISON:  Prior CT from 03-09-19. FINDINGS: MRI HEAD FINDINGS Brain: Examination mildly degraded by motion  artifact. Diffuse prominence of the CSF containing spaces compatible with generalized cerebral atrophy. Patchy and confluent T2/FLAIR hyperintensity within the periventricular and deep white matter both cerebral hemispheres most consistent chronic small vessel ischemic disease, moderate in nature. There is abnormal symmetric restricted diffusion involving the globus pallidi bilaterally. Mild scattered patchy involvement of the adjacent basal ganglia and internal capsules. Scattered multifocal patchy foci of restricted diffusion seen involving the periventricular, deep, and subcortical white matter of both cerebral hemispheres, also fairly symmetric in nature. Symmetric restricted diffusion also seen involving the hippocampal formations bilaterally. Patchy restricted diffusion also seen involving the cerebellar hemispheres bilaterally, left greater than right. Associated T2/FLAIR hyperintensity throughout the areas affected without significant mass effect. Few scattered foci of susceptibility artifact seen within the areas affected, most notable at the globus pallidi and posterior hippocampal formations, consistent with associated petechial hemorrhage. Given history of hypertension, findings favored to reflect a hypoperfusion/anoxic insult. No other evidence for  acute vascular infarct. Gray-white matter differentiation otherwise maintained. No encephalomalacia to suggest chronic cortical infarction. No other evidence for acute or chronic intracranial hemorrhage. No mass lesion, midline shift or mass effect. No hydrocephalus. Pituitary gland normal. No extra-axial fluid collection. Vascular: Major intracranial vascular flow voids are maintained. Skull and upper cervical spine: Craniocervical junction normal. Bone marrow signal intensity within normal limits. No scalp soft tissue abnormality. Sinuses/Orbits: Globes and orbital soft tissues within normal limits. Scattered mucosal thickening throughout the paranasal sinuses. Fluid seen layering within the nasopharynx. Small bilateral mastoid effusions. Patient likely intubated. Other: None. MRA HEAD FINDINGS ANTERIOR CIRCULATION: Examination moderately degraded by motion artifact. Distal cervical segments of the internal carotid arteries are patent with symmetric antegrade flow. Petrous, cavernous, and supraclinoid segments patent without obvious high-grade stenosis or other abnormality. A1 segments patent. Grossly normal anterior communicating artery complex. Anterior cerebral arteries widely patent to their distal aspects. No M1 stenosis or occlusion. Grossly normal MCA bifurcations. Distal MCA branches well perfused and fairly symmetric. POSTERIOR CIRCULATION: Vertebral arteries patent to the vertebrobasilar junction without appreciable stenosis. Patent right PICA. Left PICA not seen. Basilar widely patent to its distal aspect. Apparent focal narrowing of the mid basilar artery on MIP reconstructions felt to be most consistent with artifact. Superior cerebral arteries patent bilaterally. Both posterior cerebral arteries primarily supplied via the basilar and are well perfused to their distal aspects. No appreciable intracranial aneurysm. IMPRESSION: MRI HEAD IMPRESSION: 1. Extensive and fairly symmetric multifocal areas of  restricted diffusion and signal abnormality involving the bilateral basal ganglia, hemispheric cerebral white matter, bilateral hippocampal formations and cerebellum as above. Findings are nonspecific, with primary differential consideration consisting of possible hypoperfusion/anoxic injury given history of hypotension. Possible toxic metabolic derangement would be the primary differential consideration. 2. Underlying moderately advanced cerebral atrophy with chronic small vessel ischemic disease. MRA HEAD IMPRESSION: 1. Motion degraded exam. 2. Grossly negative MRA with no large vessel occlusion. No definite hemodynamically significant or correctable stenosis. Electronically Signed   By: Rise Mu M.D.   On: 02/20/2019 01:35     ASSESSMENT AND PLAN:   68 y.o. male  with pertinent past medical history of EtOH abuse, diabetes mellitus, hypertension, psoriasis, sciatica, mood disorder with suicidal ideation and tobacco abuse presenting to the ED with unresponsiveness.  1. Acute respiratory failure with hypoxia with aspiration pneumonia Primarily intubated to protect airway Continue full ventilatory support.  Appreciate ICU help.  2. Acute rhabdomyolysis with acute kidney injury Trend CK levels.   Improving Repeat labs daily  3. Elevated troponin -  likely due to rhabdomyolysis -Trend troponin - Echocardiogram no wall motion abnormalities.  EF of 60%  4.   Septic shock secondary to aspiration pneumonia Levophed drip. Improved  5. Type 2 diabetes mellitus - SSI - Glycemic control per ICU protocol  6. EtOH Abuse -high risk for withdrawal CIWA protocol if needed  7.  MRI with anoxic brain injury versus metabolic derangement changes Overall poor prognosis.   Patient is off sedation and poorly responsive.  Appreciate neurology input.  All the records are reviewed and case discussed with Care Management/Social Worker Management plans discussed with the patient, family and  they are in agreement.  CODE STATUS: FULL CODE  TOTAL TIME TAKING CARE OF THIS PATIENT: 35 minutes.   Molinda BailiffSrikar R Akelia Husted M.D on 02/21/2019 at 1:12 PM  Between 7am to 6pm - Pager - [phone]  After 6pm go to [email or web address] - password [email or web address]  [facility]  Office  [phone]  CC: Primary care physician; Dorcas CarrowJohnson, Megan P, DO  Note: This dictation was prepared with Dragon dictation along with smaller phrase technology. Any transcriptional errors that result from this process are unintentional.

## 2019-02-21 NOTE — Progress Notes (Signed)
Subjective: Patient remain intubated and poorly responsive.  No sedation since early morning on yesterday  Objective: Current vital signs: BP 131/79 (BP Location: Left Arm)   Pulse (!) 55   Temp 99.7 F (37.6 C) (Oral)   Resp 16   Ht 6' (1.829 m)   Wt 88.9 kg   SpO2 97%   BMI 26.58 kg/m  Vital signs in last 24 hours: Temp:  [98.7 F (37.1 C)-100 F (37.8 C)] 99.7 F (37.6 C) (10/26 0751) Pulse Rate:  [55-73] 55 (10/26 1000) Resp:  [16-18] 16 (10/26 1000) BP: (106-147)/(68-99) 131/79 (10/26 1000) SpO2:  [95 %-99 %] 97 % (10/26 1000) FiO2 (%):  [30 %] 30 % (10/26 0730)  Intake/Output from previous day: 10/25 0701 - 10/26 0700 In: 2627.9 [I.V.:1568.6; IV Piggyback:1059.3] Out: 700 [Urine:350; Emesis/NG output:350] Intake/Output this shift: Total I/O In: 359.1 [I.V.:246.5; NG/GT:40; IV Piggyback:72.5] Out: 75 [Urine:75] Nutritional status:  Diet Order            Diet NPO time specified  Diet effective now              Neurologic Exam: Mental Status: Patient does not respond to verbal stimuli. Localizes to pain with LUE with deep sternal rub.  Does not follow commands.  No verbalizations noted.  Cranial Nerves: II: patient does not respond confrontation bilaterally, pupils right 3 mm, left 3 mm,and reactive bilaterally III,IV,VI: Oculocephalic response present bilaterally. V,VII: corneal reflex present bilaterally  VIII: patient does not respond to verbal stimuli IX,X: gag reflex unable to test, XI: trapezius strength unable to test bilaterally XII: tongue strength unable to test Motor: Moves LUE with sternal rub.  No movement noted of the RUE.  Moves lower extremities if soles rubbed.     Lab Results: Basic Metabolic Panel: Recent Labs  Lab 02/11/2019 1540 02/22/2019 2123 02/19/19 0415 02/20/19 0427  NA 140 142 140 144  K >7.5* 5.6* 4.1 3.8  CL 105 108 110 110  CO2 21* 20* 23 28  GLUCOSE 146* 192* 213* 140*  BUN 36* 36* 31* 17  CREATININE 3.11* 2.73*  1.74* 1.16  CALCIUM 8.6* 8.1* 7.9* 8.0*  MG  --   --  2.1 2.0  PHOS  --   --  3.5 1.7*    Liver Function Tests: Recent Labs  Lab 01/29/2019 1540 02/19/19 0415 02/20/19 0427  AST 108* 314* 272*  ALT 63* 117* 107*  ALKPHOS 81 63 51  BILITOT 0.9 0.8 1.0  PROT 7.8 6.4* 5.8*  ALBUMIN 4.2 3.3* 2.9*   Recent Labs  Lab 02/15/2019 1954  LIPASE 51   Recent Labs  Lab 02/05/2019 2332  AMMONIA 27    CBC: Recent Labs  Lab 01/31/2019 1540 02/19/19 0415 02/20/19 0427  WBC 14.7* 10.5 10.2  NEUTROABS 12.1* 7.8* 7.1  HGB 14.8 14.4 12.6*  HCT 47.0 44.6 39.0  MCV 93.1 89.2 89.7  PLT 375 250 194    Cardiac Enzymes: Recent Labs  Lab 02/22/2019 1540 02/19/19 0415 02/20/19 0427  CKTOTAL 7,081* 21,665* 8,417*    Lipid Panel: No results for input(s): CHOL, TRIG, HDL, CHOLHDL, VLDL, LDLCALC in the last 168 hours.  CBG: Recent Labs  Lab 02/20/19 1553 02/20/19 2000 02/21/19 0002 02/21/19 0414 02/21/19 0747  GLUCAP 116* 92 110* 99 102*    Microbiology: Results for orders placed or performed during the hospital encounter of 02/16/2019  SARS Coronavirus 2 by RT PCR (hospital order, performed in [facility] hospital lab) Nasopharyngeal Urine, Catheterized  Status: None   Collection Time: 02/11/2019  3:40 PM   Specimen: Urine, Catheterized; Nasopharyngeal  Result Value Ref Range Status   SARS Coronavirus 2 NEGATIVE NEGATIVE Final    Comment: (NOTE) If result is NEGATIVE SARS-CoV-2 target nucleic acids are NOT DETECTED. The SARS-CoV-2 RNA is generally detectable in upper and lower  respiratory specimens during the acute phase of infection. The lowest  concentration of SARS-CoV-2 viral copies this assay can detect is 250  copies / mL. A negative result does not preclude SARS-CoV-2 infection  and should not be used as the sole basis for treatment or other  patient management decisions.  A negative result may occur with  improper specimen collection / handling, submission of specimen  other  than nasopharyngeal swab, presence of viral mutation(s) within the  areas targeted by this assay, and inadequate number of viral copies  (<250 copies / mL). A negative result must be combined with clinical  observations, patient history, and epidemiological information. If result is POSITIVE SARS-CoV-2 target nucleic acids are DETECTED. The SARS-CoV-2 RNA is generally detectable in upper and lower  respiratory specimens dur ing the acute phase of infection.  Positive  results are indicative of active infection with SARS-CoV-2.  Clinical  correlation with patient history and other diagnostic information is  necessary to determine patient infection status.  Positive results do  not rule out bacterial infection or co-infection with other viruses. If result is PRESUMPTIVE POSTIVE SARS-CoV-2 nucleic acids MAY BE PRESENT.   A presumptive positive result was obtained on the submitted specimen  and confirmed on repeat testing.  While 2019 novel coronavirus  (SARS-CoV-2) nucleic acids may be present in the submitted sample  additional confirmatory testing may be necessary for epidemiological  and / or clinical management purposes  to differentiate between  SARS-CoV-2 and other Sarbecovirus currently known to infect humans.  If clinically indicated additional testing with an alternate test  methodology [phone]) is advised. The SARS-CoV-2 RNA is generally  detectable in upper and lower respiratory sp ecimens during the acute  phase of infection. The expected result is Negative. Fact Sheet for Patients:  StrictlyIdeas.no Fact Sheet for Healthcare Providers: BankingDealers.co.za This test is not yet approved or cleared by the [place] FDA and has been authorized for detection and/or diagnosis of SARS-CoV-2 by FDA under an Emergency Use Authorization (EUA).  This EUA will remain in effect (meaning this test can be used) for the duration  of the COVID-19 declaration under Section 564(b)(1) of the Act, 21 U.S.C. section 360bbb-3(b)(1), unless the authorization is terminated or revoked sooner. Performed at [facility], [address], [place], [place] [address]   Urine Culture     Status: None   Collection Time: 01/31/2019  3:40 PM   Specimen: Urine, Random  Result Value Ref Range Status   Specimen Description   Final    URINE, RANDOM Performed at [facility], [address] [place], [place] [address]    Special Requests   Final    NONE Performed at [facility], [address], [place], [place] [address]    Culture   Final    NO GROWTH Performed at [facility], [place] [address] [place], [place] [address]    Report Status 02/20/2019 FINAL  Final  Blood culture (routine x 2)     Status: None (Preliminary result)   Collection Time: 02/05/2019  7:19 PM   Specimen: BLOOD  Result Value Ref Range Status   Specimen  Description BLOOD BLOOD LEFT HAND  Final   Special Requests   Final    BOTTLES DRAWN AEROBIC AND ANAEROBIC Blood Culture adequate volume   Culture   Final    NO GROWTH 3 DAYS Performed at [facility], [address] [place], [place] [address]    Report Status PENDING  Incomplete  Blood culture (routine x 2)     Status: None (Preliminary result)   Collection Time: 02/24/2019  7:20 PM   Specimen: BLOOD  Result Value Ref Range Status   Specimen Description BLOOD BLOOD RIGHT HAND  Final   Special Requests   Final    BOTTLES DRAWN AEROBIC AND ANAEROBIC Blood Culture adequate volume   Culture   Final    NO GROWTH 3 DAYS Performed at [facility], [address], [place], [place] [address]    Report Status PENDING  Incomplete  MRSA PCR Screening     Status: None   Collection Time: 02/16/2019  8:50 PM   Specimen: Nasal Mucosa; Nasopharyngeal  Result Value Ref Range Status   MRSA by PCR NEGATIVE NEGATIVE Final    Comment:        The GeneXpert MRSA  Assay (FDA approved for NASAL specimens only), is one component of a comprehensive MRSA colonization surveillance program. It is not intended to diagnose MRSA infection nor to guide or monitor treatment for MRSA infections. Performed at [facility], [address] [place], [place] [address]   Culture, respiratory (non-expectorated)     Status: None (Preliminary result)   Collection Time: 02/19/19  2:59 AM   Specimen: Tracheal Aspirate; Respiratory  Result Value Ref Range Status   Specimen Description   Final    TRACHEAL ASPIRATE Performed at [facility], [address] [place], [place] [address]    Special Requests   Final    NONE Performed at [facility], [address] [place], [place] [address]    Gram Stain   Final    RARE WBC PRESENT, PREDOMINANTLY PMN ABUNDANT GRAM POSITIVE COCCI RARE GRAM POSITIVE RODS    Culture   Final    CULTURE REINCUBATED FOR BETTER GROWTH Performed at [facility], [place] [address] [place], [place] [address]    Report Status PENDING  Incomplete    Coagulation Studies: Recent Labs    02/05/2019 1540 02/19/19 0415  LABPROT 15.0 15.6*  INR 1.2 1.3*    Imaging: Dg Eye Foreign Body  Result Date: 02/19/2019 CLINICAL DATA:  Metal working/exposure; clearance prior to MRI EXAM: ORBITS FOR FOREIGN BODY - 11 VIEW COMPARISON:  None. FINDINGS: Water's view obtained. No intraorbital radiopaque foreign body. Visualized paranasal sinuses clear. No fracture or dislocation. IMPRESSION: No evidence of metallic foreign body within the orbits on single waters view. Electronically Signed   By: Lowella Grip III M.D.   On: 02/19/2019 15:55   Dg Abd 1 View  Result Date: 02/19/2019 CLINICAL DATA:  Nasogastric tube placement EXAM: ABDOMEN - 1 VIEW COMPARISON:  None. FINDINGS: Nasogastric tube tip and side port are in the stomach. There is moderate stool in the colon. There is no bowel dilatation or air-fluid level to  suggest bowel obstruction. No free air. There is degenerative change in the lumbar spine. 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 There is iliac artery atherosclerosis, primarily on the right. IMPRESSION: Nasogastric tube tip and side port in stomach. No bowel obstruction or free  air. 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 Electronically Signed   By: Lowella Grip III M.D.   On: 02/19/2019 15:56   Mr Angio Head Wo Contrast  Result Date: 02/20/2019 CLINICAL DATA:  Initial evaluation for acute altered mental status, found down, hypotensive. EXAM: MRI HEAD WITHOUT CONTRAST MRA HEAD WITHOUT CONTRAST TECHNIQUE: Multiplanar, multiecho pulse sequences of the brain and surrounding structures were obtained without intravenous contrast. Angiographic images of the head were obtained using MRA technique without contrast. COMPARISON:  Prior CT from 01/28/2019. FINDINGS: MRI HEAD FINDINGS Brain: Examination mildly degraded by motion artifact. Diffuse prominence of the CSF containing spaces compatible with generalized cerebral atrophy. Patchy and confluent T2/FLAIR hyperintensity within the periventricular and deep white matter both cerebral hemispheres most consistent chronic small vessel ischemic disease, moderate in nature. There is abnormal symmetric restricted diffusion involving the globus pallidi bilaterally. Mild scattered patchy involvement of the adjacent basal ganglia and internal capsules. Scattered multifocal patchy foci of restricted diffusion seen involving the periventricular, deep, and subcortical white matter of both cerebral hemispheres, also fairly symmetric in nature. Symmetric restricted diffusion also seen involving the hippocampal formations bilaterally. Patchy restricted diffusion also seen involving the cerebellar hemispheres bilaterally, left greater than right. Associated  T2/FLAIR hyperintensity throughout the areas affected without significant mass effect. Few scattered foci of susceptibility artifact seen within the areas affected, most notable at the globus pallidi and posterior hippocampal formations, consistent with associated petechial hemorrhage. Given history of hypertension, findings favored to reflect a hypoperfusion/anoxic insult. No other evidence for acute vascular infarct. Gray-white matter differentiation otherwise maintained. No encephalomalacia to suggest chronic cortical infarction. No other evidence for acute or chronic intracranial hemorrhage. No mass lesion, midline shift or mass effect. No hydrocephalus. Pituitary gland normal. No extra-axial fluid collection. Vascular: Major intracranial vascular flow voids are maintained. Skull and upper cervical spine: Craniocervical junction normal. Bone marrow signal intensity within normal limits. No scalp soft tissue abnormality. Sinuses/Orbits: Globes and orbital soft tissues within normal limits. Scattered mucosal thickening throughout the paranasal sinuses. Fluid seen layering within the nasopharynx. Small bilateral mastoid effusions. Patient likely intubated. Other: None. MRA HEAD FINDINGS ANTERIOR CIRCULATION: Examination moderately degraded by motion artifact. Distal cervical segments of the internal carotid arteries are patent with symmetric antegrade flow. Petrous, cavernous, and supraclinoid segments patent without obvious high-grade stenosis or other abnormality. A1 segments patent. Grossly normal anterior communicating artery complex. Anterior cerebral arteries widely patent to their distal aspects. No M1 stenosis or occlusion. Grossly normal MCA bifurcations. Distal MCA branches well perfused and fairly symmetric. POSTERIOR CIRCULATION: Vertebral arteries patent to the vertebrobasilar junction without appreciable stenosis. Patent right PICA. Left PICA not seen. Basilar widely patent to its distal aspect.  Apparent focal narrowing of the mid basilar artery on MIP reconstructions felt to be most consistent with artifact. Superior cerebral arteries patent bilaterally. Both posterior cerebral arteries primarily supplied via the basilar and are well perfused to their distal aspects. No appreciable intracranial aneurysm. IMPRESSION: MRI HEAD IMPRESSION: 1. Extensive and fairly symmetric multifocal areas of restricted diffusion and signal abnormality involving the bilateral basal ganglia, hemispheric cerebral white matter, bilateral hippocampal formations and cerebellum as above. Findings are nonspecific, with primary differential consideration consisting of possible hypoperfusion/anoxic injury given history of hypotension. Possible toxic metabolic derangement would be the primary differential consideration. 2. Underlying moderately advanced cerebral atrophy with chronic small vessel ischemic disease. MRA HEAD IMPRESSION: 1. Motion degraded exam. 2. Grossly negative MRA with no large  vessel occlusion. No definite hemodynamically significant or correctable stenosis. Electronically Signed   By: Jeannine Boga M.D.   On: 02/20/2019 01:35   Mr Brain Wo Contrast  Result Date: 02/20/2019 CLINICAL DATA:  Initial evaluation for acute altered mental status, found down, hypotensive. EXAM: MRI HEAD WITHOUT CONTRAST MRA HEAD WITHOUT CONTRAST TECHNIQUE: Multiplanar, multiecho pulse sequences of the brain and surrounding structures were obtained without intravenous contrast. Angiographic images of the head were obtained using MRA technique without contrast. COMPARISON:  Prior CT from 01/30/2019. FINDINGS: MRI HEAD FINDINGS Brain: Examination mildly degraded by motion artifact. Diffuse prominence of the CSF containing spaces compatible with generalized cerebral atrophy. Patchy and confluent T2/FLAIR hyperintensity within the periventricular and deep white matter both cerebral hemispheres most consistent chronic small vessel  ischemic disease, moderate in nature. There is abnormal symmetric restricted diffusion involving the globus pallidi bilaterally. Mild scattered patchy involvement of the adjacent basal ganglia and internal capsules. Scattered multifocal patchy foci of restricted diffusion seen involving the periventricular, deep, and subcortical white matter of both cerebral hemispheres, also fairly symmetric in nature. Symmetric restricted diffusion also seen involving the hippocampal formations bilaterally. Patchy restricted diffusion also seen involving the cerebellar hemispheres bilaterally, left greater than right. Associated T2/FLAIR hyperintensity throughout the areas affected without significant mass effect. Few scattered foci of susceptibility artifact seen within the areas affected, most notable at the globus pallidi and posterior hippocampal formations, consistent with associated petechial hemorrhage. Given history of hypertension, findings favored to reflect a hypoperfusion/anoxic insult. No other evidence for acute vascular infarct. Gray-white matter differentiation otherwise maintained. No encephalomalacia to suggest chronic cortical infarction. No other evidence for acute or chronic intracranial hemorrhage. No mass lesion, midline shift or mass effect. No hydrocephalus. Pituitary gland normal. No extra-axial fluid collection. Vascular: Major intracranial vascular flow voids are maintained. Skull and upper cervical spine: Craniocervical junction normal. Bone marrow signal intensity within normal limits. No scalp soft tissue abnormality. Sinuses/Orbits: Globes and orbital soft tissues within normal limits. Scattered mucosal thickening throughout the paranasal sinuses. Fluid seen layering within the nasopharynx. Small bilateral mastoid effusions. Patient likely intubated. Other: None. MRA HEAD FINDINGS ANTERIOR CIRCULATION: Examination moderately degraded by motion artifact. Distal cervical segments of the internal  carotid arteries are patent with symmetric antegrade flow. Petrous, cavernous, and supraclinoid segments patent without obvious high-grade stenosis or other abnormality. A1 segments patent. Grossly normal anterior communicating artery complex. Anterior cerebral arteries widely patent to their distal aspects. No M1 stenosis or occlusion. Grossly normal MCA bifurcations. Distal MCA branches well perfused and fairly symmetric. POSTERIOR CIRCULATION: Vertebral arteries patent to the vertebrobasilar junction without appreciable stenosis. Patent right PICA. Left PICA not seen. Basilar widely patent to its distal aspect. Apparent focal narrowing of the mid basilar artery on MIP reconstructions felt to be most consistent with artifact. Superior cerebral arteries patent bilaterally. Both posterior cerebral arteries primarily supplied via the basilar and are well perfused to their distal aspects. No appreciable intracranial aneurysm. IMPRESSION: MRI HEAD IMPRESSION: 1. Extensive and fairly symmetric multifocal areas of restricted diffusion and signal abnormality involving the bilateral basal ganglia, hemispheric cerebral white matter, bilateral hippocampal formations and cerebellum as above. Findings are nonspecific, with primary differential consideration consisting of possible hypoperfusion/anoxic injury given history of hypotension. Possible toxic metabolic derangement would be the primary differential consideration. 2. Underlying moderately advanced cerebral atrophy with chronic small vessel ischemic disease. MRA HEAD IMPRESSION: 1. Motion degraded exam. 2. Grossly negative MRA with no large vessel occlusion. No definite hemodynamically significant or correctable stenosis.  Electronically Signed   By: Jeannine Boga M.D.   On: 02/20/2019 01:35   US Renal  Result Date: 02/19/2019 CLINICAL DATA:  Acute renal failure. EXAM: RENAL / URINARY TRACT ULTRASOUND COMPLETE COMPARISON:  None. FINDINGS: Right Kidney: Renal  measurements: 9.7 x 5.8 x 5.7 cm = volume: 170 mL . Echogenicity within normal limits. No mass or hydronephrosis visualized. Left Kidney: Renal measurements: 10.1 x 5.0 x 5.1 cm = volume: 137 mL. Echogenicity within normal limits. No mass or hydronephrosis visualized. Bladder: Foley catheter within a partially decompressed bladder. Other: None. IMPRESSION: Normal renal ultrasound. Electronically Signed   By: Titus Dubin M.D.   On: 02/19/2019 13:16    Medications:  I have reviewed the patient's current medications. Scheduled: . aspirin  300 mg Rectal Daily  . atorvastatin  40 mg Oral Daily  . chlorhexidine      . chlorhexidine gluconate (MEDLINE KIT)  15 mL Mouth Rinse BID  . Chlorhexidine Gluconate Cloth  6 each Topical Q0600  . folic acid  1 mg Per Tube Daily  . heparin  5,000 Units Subcutaneous Q8H  . insulin aspart  0-9 Units Subcutaneous Q4H  . mouth rinse  15 mL Mouth Rinse 10 times per day  . thiamine  100 mg Oral Daily   Or  . thiamine  100 mg Intravenous Daily    Assessment/Plan: 68 year old male with a history of ETOH abuse, HTN and DM found down.  Unclear period of time down.  Intubated for airway management.  Multiple metabolic abnormalities and in rhabdo.  MRI of the brain reviewed and shows multiple areas of diffusion weighted abnormality that are fairly symmetric and may suggest hypoperfusion injury and/or acute ischemic injury.  Patient remains poorly responsive.  Echocardiogram shows no cardiac source of emboli with EF of 60-65%.     Recommendations: 1. EEG pending 2. Liberal BP control 3. Frequent neuro checks 4. Telemetry 5. ASA 390m rectally, daily    LOS: 3 days   LAlexis Goodell MD Neurology [phone]/26/2020  10:40 AM

## 2019-02-21 NOTE — Progress Notes (Signed)
Name: David Blackburn MRN: [id number] DOB: [birth year]    ADMISSION DATE:  02/01/2019 CONSULTATION DATE: 02/10/2019  REFERRING MD : Rufina Falco, NP   BRIEF PATIENT DESCRIPTION:  68 yo male with hx of ETOH abuse admitted with acute encephalopathy, acute renal failure with hyperkalemia, and acute respiratory failure secondary to severe metabolic acidosis and possible aspiration pneumonia requiring mechanical intubation     CHIEF COMPLAINT: follow up resp failure  HPI Remains critically ill evidence of brain injury with CVA's on MRI On vent     SIGNIFICANT EVENTS/STUDIES:  10/23: Pt admitted to ICU mechanically intubated  10/23: CT Head revealed no CT evidence for acute intracranial abnormality. Atrophy and small vessel ischemic changes of the white matter 10/24  Moving to sternal rub without sedation, not following verbal communication  10/25 - remains unresponsive - s/p MRI brain - extensive symmetric diffusion and signal abnormality, s/p neuro eval - significant disability expected at best case scenario.      PAST MEDICAL HISTORY :   has a past medical history of Alcohol abuse (01/30/2017), Dental abscess (08/29/2014), Diabetes mellitus without complication ([place]), Hypertension, Psoriasis, Sciatica, and Substance induced mood disorder ([place]) (01/30/2017).  has a past surgical history that includes Tonsillectomy (1961) and Vasectomy (1980). Prior to Admission medications   Medication Sig Start Date End Date Taking? Authorizing Provider  atorvastatin (LIPITOR) 40 MG tablet Take 1 tablet (40 mg total) by mouth daily. 11/18/18  Yes Johnson, Megan P, DO  cholecalciferol (VITAMIN D3) 25 MCG (1000 UT) tablet Take 1,000 Units by mouth daily.   Yes [provider]  folic acid (FOLVITE) 211 MCG tablet Take 400 mcg by mouth daily.   Yes [provider]  Inositol Niacinate (NIACIN FLUSH FREE) 500 MG CAPS Take 500 mg by mouth daily.   Yes [provider]   losartan (COZAAR) 50 MG tablet Take 1 tablet (50 mg total) by mouth daily. Patient taking differently: Take 50 mg by mouth at bedtime.  11/18/18  Yes Johnson, Megan P, DO  metFORMIN (GLUCOPHAGE-XR) 500 MG 24 hr tablet TAKE 1 TABLET BY MOUTH ONCE DAILY WITH BREAKFAST Patient taking differently: Take 500 mg by mouth daily with breakfast.  11/18/18  Yes Johnson, Megan P, DO  Multiple Vitamin (MULTIVITAMIN) tablet Take 1 tablet by mouth daily.   Yes [provider]  naproxen (NAPROSYN) 500 MG tablet TAKE 1 TABLET BY MOUTH TWICE DAILY WITH MEALS Patient taking differently: Take 500 mg by mouth 2 (two) times daily with a meal.  12/28/18  Yes Johnson, Megan P, DO  QUEtiapine (SEROQUEL XR) 50 MG TB24 24 hr tablet Take 2 tablets (100 mg total) by mouth at bedtime. 11/18/18  Yes Johnson, Megan P, DO   No Known Allergies  REVIEW OF SYSTEMS  PATIENT IS UNABLE TO PROVIDE COMPLETE REVIEW OF SYSTEM S DUE TO SEVERE CRITICAL ILLNESS AND ENCEPHALOPATHY   VITAL SIGNS: Temp:  [98.7 F (37.1 C)-100 F (37.8 C)] 98.9 F (37.2 C) (10/26 0400) Pulse Rate:  [58-77] 68 (10/26 0600) Resp:  [16-18] 16 (10/26 0600) BP: (106-147)/(68-99) 129/88 (10/26 0600) SpO2:  [95 %-99 %] 97 % (10/26 0600) FiO2 (%):  [30 %] 30 % (10/26 0321)  PHYSICAL EXAMINATION:  GENERAL:critically ill appearing, +resp distress HEAD: Normocephalic, atraumatic.  EYES: Pupils equal, round, reactive to light.  No scleral icterus.  MOUTH: Moist mucosal membrane. NECK: Supple. No thyromegaly. No nodules. No JVD.  PULMONARY: +rhonchi, +wheezing CARDIOVASCULAR: S1 and S2. Regular rate and rhythm. No murmurs, rubs,  or gallops.  GASTROINTESTINAL: Soft, nontender, -distended. Positive bowel sounds.  MUSCULOSKELETAL: No swelling, clubbing, or edema.  NEUROLOGIC: obtunded SKIN:intact,warm,dry    Recent Labs  Lab 10/01/18 2123 02/19/19 0415 02/20/19 0427  NA 142 140 144  K 5.6* 4.1 3.8  CL 108 110 110  CO2 20* 23 28  BUN 36* 31*  17  CREATININE 2.73* 1.74* 1.16  GLUCOSE 192* 213* 140*   Recent Labs  Lab 10/01/18 1540 02/19/19 0415 02/20/19 0427  HGB 14.8 14.4 12.6*  HCT 47.0 44.6 39.0  WBC 14.7* 10.5 10.2  PLT 375 250 194   Dg Eye Foreign Body  Result Date: 02/19/2019 CLINICAL DATA:  Metal working/exposure; clearance prior to MRI EXAM: ORBITS FOR FOREIGN BODY - 11 VIEW COMPARISON:  None. FINDINGS: Water's view obtained. No intraorbital radiopaque foreign body. Visualized paranasal sinuses clear. No fracture or dislocation. IMPRESSION: No evidence of metallic foreign body within the orbits on single waters view. Electronically Signed   By: Bretta BangWilliam  Woodruff III M.D.   On: 02/19/2019 15:55   Dg Abd 1 View  Result Date: 02/19/2019 CLINICAL DATA:  Nasogastric tube placement EXAM: ABDOMEN - 1 VIEW COMPARISON:  None. FINDINGS: Nasogastric tube tip and side port are in the stomach. There is moderate stool in the colon. There is no bowel dilatation or air-fluid level to suggest bowel obstruction. No free air. There is degenerative change in the lumbar spine. 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 There is iliac artery atherosclerosis, primarily on the right. IMPRESSION: Nasogastric tube tip and side port in stomach. No bowel obstruction or free air. 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 Electronically Signed   By: Bretta BangWilliam  Woodruff III M.D.   On: 02/19/2019 15:56   Mr Angio Head Wo Contrast  Result Date: 02/20/2019 CLINICAL DATA:  Initial evaluation for acute altered mental status, found down, hypotensive. EXAM: MRI HEAD WITHOUT CONTRAST MRA HEAD WITHOUT CONTRAST TECHNIQUE: Multiplanar, multiecho pulse sequences of the brain and surrounding structures were obtained without intravenous contrast. Angiographic images of the head were obtained using MRA technique without contrast. COMPARISON:  Prior CT  from 10/01/2018. FINDINGS: MRI HEAD FINDINGS Brain: Examination mildly degraded by motion artifact. Diffuse prominence of the CSF containing spaces compatible with generalized cerebral atrophy. Patchy and confluent T2/FLAIR hyperintensity within the periventricular and deep white matter both cerebral hemispheres most consistent chronic small vessel ischemic disease, moderate in nature. There is abnormal symmetric restricted diffusion involving the globus pallidi bilaterally. Mild scattered patchy involvement of the adjacent basal ganglia and internal capsules. Scattered multifocal patchy foci of restricted diffusion seen involving the periventricular, deep, and subcortical white matter of both cerebral hemispheres, also fairly symmetric in nature. Symmetric restricted diffusion also seen involving the hippocampal formations bilaterally. Patchy restricted diffusion also seen involving the cerebellar hemispheres bilaterally, left greater than right. Associated T2/FLAIR hyperintensity throughout the areas affected without significant mass effect. Few scattered foci of susceptibility artifact seen within the areas affected, most notable at the globus pallidi and posterior hippocampal formations, consistent with associated petechial hemorrhage. Given history of hypertension, findings favored to reflect a hypoperfusion/anoxic insult. No other evidence for acute vascular infarct. Gray-white matter differentiation otherwise maintained. No encephalomalacia to suggest chronic cortical infarction. No other evidence for acute or chronic intracranial hemorrhage. No mass lesion, midline shift or mass effect. No hydrocephalus. Pituitary gland normal. No extra-axial fluid collection. Vascular: Major intracranial vascular  flow voids are maintained. Skull and upper cervical spine: Craniocervical junction normal. Bone marrow signal intensity within normal limits. No scalp soft tissue abnormality. Sinuses/Orbits: Globes and orbital  soft tissues within normal limits. Scattered mucosal thickening throughout the paranasal sinuses. Fluid seen layering within the nasopharynx. Small bilateral mastoid effusions. Patient likely intubated. Other: None. MRA HEAD FINDINGS ANTERIOR CIRCULATION: Examination moderately degraded by motion artifact. Distal cervical segments of the internal carotid arteries are patent with symmetric antegrade flow. Petrous, cavernous, and supraclinoid segments patent without obvious high-grade stenosis or other abnormality. A1 segments patent. Grossly normal anterior communicating artery complex. Anterior cerebral arteries widely patent to their distal aspects. No M1 stenosis or occlusion. Grossly normal MCA bifurcations. Distal MCA branches well perfused and fairly symmetric. POSTERIOR CIRCULATION: Vertebral arteries patent to the vertebrobasilar junction without appreciable stenosis. Patent right PICA. Left PICA not seen. Basilar widely patent to its distal aspect. Apparent focal narrowing of the mid basilar artery on MIP reconstructions felt to be most consistent with artifact. Superior cerebral arteries patent bilaterally. Both posterior cerebral arteries primarily supplied via the basilar and are well perfused to their distal aspects. No appreciable intracranial aneurysm. IMPRESSION: MRI HEAD IMPRESSION: 1. Extensive and fairly symmetric multifocal areas of restricted diffusion and signal abnormality involving the bilateral basal ganglia, hemispheric cerebral white matter, bilateral hippocampal formations and cerebellum as above. Findings are nonspecific, with primary differential consideration consisting of possible hypoperfusion/anoxic injury given history of hypotension. Possible toxic metabolic derangement would be the primary differential consideration. 2. Underlying moderately advanced cerebral atrophy with chronic small vessel ischemic disease. MRA HEAD IMPRESSION: 1. Motion degraded exam. 2. Grossly negative MRA  with no large vessel occlusion. No definite hemodynamically significant or correctable stenosis. Electronically Signed   By: Rise Mu M.D.   On: 02/20/2019 01:35   Mr Brain Wo Contrast  Result Date: 02/20/2019 CLINICAL DATA:  Initial evaluation for acute altered mental status, found down, hypotensive. EXAM: MRI HEAD WITHOUT CONTRAST MRA HEAD WITHOUT CONTRAST TECHNIQUE: Multiplanar, multiecho pulse sequences of the brain and surrounding structures were obtained without intravenous contrast. Angiographic images of the head were obtained using MRA technique without contrast. COMPARISON:  Prior CT from 02/20/2019. FINDINGS: MRI HEAD FINDINGS Brain: Examination mildly degraded by motion artifact. Diffuse prominence of the CSF containing spaces compatible with generalized cerebral atrophy. Patchy and confluent T2/FLAIR hyperintensity within the periventricular and deep white matter both cerebral hemispheres most consistent chronic small vessel ischemic disease, moderate in nature. There is abnormal symmetric restricted diffusion involving the globus pallidi bilaterally. Mild scattered patchy involvement of the adjacent basal ganglia and internal capsules. Scattered multifocal patchy foci of restricted diffusion seen involving the periventricular, deep, and subcortical white matter of both cerebral hemispheres, also fairly symmetric in nature. Symmetric restricted diffusion also seen involving the hippocampal formations bilaterally. Patchy restricted diffusion also seen involving the cerebellar hemispheres bilaterally, left greater than right. Associated T2/FLAIR hyperintensity throughout the areas affected without significant mass effect. Few scattered foci of susceptibility artifact seen within the areas affected, most notable at the globus pallidi and posterior hippocampal formations, consistent with associated petechial hemorrhage. Given history of hypertension, findings favored to reflect a  hypoperfusion/anoxic insult. No other evidence for acute vascular infarct. Gray-white matter differentiation otherwise maintained. No encephalomalacia to suggest chronic cortical infarction. No other evidence for acute or chronic intracranial hemorrhage. No mass lesion, midline shift or mass effect. No hydrocephalus. Pituitary gland normal. No extra-axial fluid collection. Vascular: Major intracranial vascular flow voids are maintained. Skull and upper cervical  spine: Craniocervical junction normal. Bone marrow signal intensity within normal limits. No scalp soft tissue abnormality. Sinuses/Orbits: Globes and orbital soft tissues within normal limits. Scattered mucosal thickening throughout the paranasal sinuses. Fluid seen layering within the nasopharynx. Small bilateral mastoid effusions. Patient likely intubated. Other: None. MRA HEAD FINDINGS ANTERIOR CIRCULATION: Examination moderately degraded by motion artifact. Distal cervical segments of the internal carotid arteries are patent with symmetric antegrade flow. Petrous, cavernous, and supraclinoid segments patent without obvious high-grade stenosis or other abnormality. A1 segments patent. Grossly normal anterior communicating artery complex. Anterior cerebral arteries widely patent to their distal aspects. No M1 stenosis or occlusion. Grossly normal MCA bifurcations. Distal MCA branches well perfused and fairly symmetric. POSTERIOR CIRCULATION: Vertebral arteries patent to the vertebrobasilar junction without appreciable stenosis. Patent right PICA. Left PICA not seen. Basilar widely patent to its distal aspect. Apparent focal narrowing of the mid basilar artery on MIP reconstructions felt to be most consistent with artifact. Superior cerebral arteries patent bilaterally. Both posterior cerebral arteries primarily supplied via the basilar and are well perfused to their distal aspects. No appreciable intracranial aneurysm. IMPRESSION: MRI HEAD IMPRESSION: 1.  Extensive and fairly symmetric multifocal areas of restricted diffusion and signal abnormality involving the bilateral basal ganglia, hemispheric cerebral white matter, bilateral hippocampal formations and cerebellum as above. Findings are nonspecific, with primary differential consideration consisting of possible hypoperfusion/anoxic injury given history of hypotension. Possible toxic metabolic derangement would be the primary differential consideration. 2. Underlying moderately advanced cerebral atrophy with chronic small vessel ischemic disease. MRA HEAD IMPRESSION: 1. Motion degraded exam. 2. Grossly negative MRA with no large vessel occlusion. No definite hemodynamically significant or correctable stenosis. Electronically Signed   By: Rise Mu M.D.   On: 02/20/2019 01:35   US Renal  Result Date: 02/19/2019 CLINICAL DATA:  Acute renal failure. EXAM: RENAL / URINARY TRACT ULTRASOUND COMPLETE COMPARISON:  None. FINDINGS: Right Kidney: Renal measurements: 9.7 x 5.8 x 5.7 cm = volume: 170 mL . Echogenicity within normal limits. No mass or hydronephrosis visualized. Left Kidney: Renal measurements: 10.1 x 5.0 x 5.1 cm = volume: 137 mL. Echogenicity within normal limits. No mass or hydronephrosis visualized. Bladder: Foley catheter within a partially decompressed bladder. Other: None. IMPRESSION: Normal renal ultrasound. Electronically Signed   By: Obie Dredge M.D.   On: 02/19/2019 13:16    ASSESSMENT / PLAN:     Acute respiratory failure secondary to severe metabolic acidosis and possible aspiration  Mechanical intubation    Severe ACUTE Hypoxic and Hypercapnic Respiratory Failure -continue Mechanical Ventilator support -continue Bronchodilator Therapy -Wean Fio2 and PEEP as tolerated -VAP/VENT bundle implementation   NEUROLOGY - intubated and sedated - minimal sedation to achieve a RASS goal: -1 MRI-Extensive and fairly symmetric multifocal areas of restricted diffusion  and signal abnormality involving the bilateral basal ganglia, hemispheric cerebral white matter, bilateral hippocampal formations and cerebellum as above. Findings are nonspecific, with primary differential consideration consisting of possible hypoperfusion/anoxic injury given history of hypotension.  ACUTE KIDNEY INJURY/Renal Failure from Rhabdomyolysis -follow chem 7 -follow UO -continue Foley Catheter-assess need -Avoid nephrotoxic agents Continue biCARB infusion  ELECTROLYTES -follow labs as needed -replace as needed -pharmacy consultation and following    DVT/GI PRX ordered TRANSFUSIONS AS NEEDED MONITOR FSBS ASSESS the need for LABS as needed   GI GI PROPHYLAXIS as indicated  NUTRITIONAL STATUS DIET-->TF's as tolerated Constipation protocol as indicated   Critical Care Time devoted to patient care services described in this note is 31 minutes.   Overall,  patient is critically ill, prognosis is guarded.  Patient with Multiorgan failure and at high risk for cardiac arrest and death.   Recommend DNR status and will need to update family if available  Lucie Leather, M.D.  [facility]  Medical Director [facility] Medical Director [facility] Cardio-Pulmonary Department

## 2019-02-21 NOTE — Progress Notes (Signed)
Pharmacy Antibiotic Note  David Blackburn is a 68 y.o. male admitted on 2019-03-04 with aspiration pneumonia.  Will start Unasyn 3g IV q6h and treat a bit aggressively, if renal function worsens will decrease dose to q12h; however, patient is in AKI w/ a trending down SCr. Pharmacy has been consulted for Unasyn dosing. SCr: 3.11>2.73>1.74>1.16   Plan: -Continue Unasyn 3g IV q6h -monitor renal function, s/sx of infection, and adverse effects  Height: 6' (182.9 cm) Weight: 195 lb 15.8 oz (88.9 kg) IBW/kg (Calculated) : 77.6  Temp (24hrs), Avg:99.3 F (37.4 C), Min:98.7 F (37.1 C), Max:100 F (37.8 C)  Recent Labs  Lab 03-04-2019 1540 Mar 04, 2019 1745 03-04-2019 2123 02/19/19 0415 02/20/19 0427  WBC 14.7*  --   --  10.5 10.2  CREATININE 3.11*  --  2.73* 1.74* 1.16  LATICACIDVEN 3.1* 3.2*  --   --   --     Estimated Creatinine Clearance: 68.8 mL/min (by C-G formula based on SCr of 1.16 mg/dL).    No Known Allergies   Thank you for allowing pharmacy to be a part of this patient's care.  Sallye Lat, PharmD Candidate 02/21/2019 10:58 AM

## 2019-02-21 NOTE — Progress Notes (Signed)
Clinical status relayed to family-Son Larkin Ina who lives [place]  Updated and notified of patients medical condition-  Progressive multiorgan failure with very low chance of meaningful recovery.  Family understands the situation.  They have consented and agreed to DNR.  Family are satisfied with Plan of action and management. All questions answered  Corrin Parker, M.D.  [facility]  Medical Director [place] Director [facility] Cardio-Pulmonary Department

## 2019-02-21 NOTE — Progress Notes (Addendum)
Pt in NSR throughout shift. Pt was started on tube feeds. Pt is currently responding to pain and has been throughout shift. Pt does have a stage II sacral pressure ulcer that was present prior to admission. New foam applied. Still trying to get a hold of family, Dr. Mortimer Fries did speak with Catalina Antigua (stepbrother) and working on contacting family.

## 2019-02-22 DIAGNOSIS — G939 Disorder of brain, unspecified: Secondary | ICD-10-CM | POA: Diagnosis not present

## 2019-02-22 DIAGNOSIS — L8990 Pressure ulcer of unspecified site, unspecified stage: Secondary | ICD-10-CM | POA: Insufficient documentation

## 2019-02-22 DIAGNOSIS — J9601 Acute respiratory failure with hypoxia: Secondary | ICD-10-CM | POA: Diagnosis not present

## 2019-02-22 DIAGNOSIS — R4189 Other symptoms and signs involving cognitive functions and awareness: Secondary | ICD-10-CM | POA: Diagnosis not present

## 2019-02-22 LAB — GLUCOSE, CAPILLARY
Glucose-Capillary: 151 mg/dL — ABNORMAL HIGH (ref 70–99)
Glucose-Capillary: 134 mg/dL — ABNORMAL HIGH (ref 70–99)
Glucose-Capillary: 155 mg/dL — ABNORMAL HIGH (ref 70–99)
Glucose-Capillary: 143 mg/dL — ABNORMAL HIGH (ref 70–99)
Glucose-Capillary: 128 mg/dL — ABNORMAL HIGH (ref 70–99)

## 2019-02-22 LAB — CBC
HCT: 37 % — ABNORMAL LOW (ref 39.0–52.0)
Hemoglobin: 11.8 g/dL — ABNORMAL LOW (ref 13.0–17.0)
MCH: 28.5 pg (ref 26.0–34.0)
MCHC: 31.9 g/dL (ref 30.0–36.0)
MCV: 89.4 fL (ref 80.0–100.0)
Platelets: 191 10*3/uL (ref 150–400)
RBC: 4.14 MIL/uL — ABNORMAL LOW (ref 4.22–5.81)
RDW: 13.5 % (ref 11.5–15.5)
WBC: 7.8 10*3/uL (ref 4.0–10.5)
nRBC: 0 % (ref 0.0–0.2)

## 2019-02-22 LAB — BASIC METABOLIC PANEL
Anion gap: 6 (ref 5–15)
BUN: 22 mg/dL (ref 8–23)
CO2: 26 mmol/L (ref 22–32)
Calcium: 8.6 mg/dL — ABNORMAL LOW (ref 8.9–10.3)
Chloride: 111 mmol/L (ref 98–111)
Creatinine, Ser: 0.82 mg/dL (ref 0.61–1.24)
GFR calc Af Amer: >60 mL/min (ref >60)
GFR calc non Af Amer: >60 mL/min (ref >60)
Glucose, Bld: 166 mg/dL — ABNORMAL HIGH (ref 70–99)
Potassium: 3.8 mmol/L (ref 3.5–5.1)
Sodium: 143 mmol/L (ref 135–145)

## 2019-02-22 LAB — PHOSPHORUS: Phosphorus: 3.7 mg/dL (ref 2.5–4.6)

## 2019-02-22 LAB — MAGNESIUM: Magnesium: 2.1 mg/dL (ref 1.7–2.4)

## 2019-02-22 MED ORDER — MORPHINE SULFATE (PF) 2 MG/ML IV SOLN
2.0000 mg | Freq: Once | INTRAVENOUS | Status: AC
  Administered 2019-02-22: 20:00:00 2 mg via INTRAVENOUS
  Filled 2019-02-22: qty 1

## 2019-02-22 MED ORDER — FAMOTIDINE 20 MG PO TABS
20.0000 mg | ORAL_TABLET | Freq: Every day | ORAL | Status: DC
  Administered 2019-02-22: 20 mg
  Filled 2019-02-22: qty 1

## 2019-02-22 MED ORDER — POTASSIUM CHLORIDE 20 MEQ PO PACK
40.0000 meq | PACK | Freq: Once | ORAL | Status: AC
  Administered 2019-02-22: 20:00:00 40 meq
  Filled 2019-02-22: qty 2

## 2019-02-22 MED ORDER — VITAMIN B-1 100 MG PO TABS: 100.0000 mg | ORAL_TABLET | Freq: Every day | ORAL | Status: DC

## 2019-02-22 MED ORDER — MORPHINE SULFATE (PF) 2 MG/ML IV SOLN
2.0000 mg | Freq: Once | INTRAVENOUS | Status: AC
  Administered 2019-02-22: 2 mg via INTRAVENOUS
  Filled 2019-02-22: qty 1

## 2019-02-22 MED ORDER — ASPIRIN 325 MG PO TABS: 325.0000 mg | ORAL_TABLET | Freq: Every day | ORAL | Status: DC

## 2019-02-22 NOTE — Progress Notes (Signed)
[facility] [facility] [place] NAME: David Blackburn    MR#:  [id number]  DATE OF BIRTH:  13-May-1950  SUBJECTIVE:  Intubated , On full vent support Off sedation.  Poorly responsive.  REVIEW OF SYSTEMS:    Review of Systems  Unable to perform ROS: Intubated   DRUG ALLERGIES:  No Known Allergies  VITALS:  Blood pressure 116/82, pulse (!) 55, temperature 99.5 F (37.5 C), temperature source Oral, resp. rate 17, height 6' 0.01" (1.829 m), weight 88.9 kg, SpO2 97 %.  PHYSICAL EXAMINATION:   Physical Exam  GENERAL:  68 y.o.-year-old patient lying in the bed.  EYES: Pupils equal, round, reactive to light and accommodation. No scleral icterus. Extraocular muscles intact.  HEENT: Head atraumatic, normocephalic. Oropharynx and nasopharynx clear. ETT NECK:  Supple, no jugular venous distention. No thyroid enlargement, no tenderness.  LUNGS: Normal breath sounds bilaterally, no wheezing, rales, rhonchi. No use of accessory muscles of respiration.  CARDIOVASCULAR: S1, S2 normal. No murmurs, rubs, or gallops.  ABDOMEN: Soft, nontender, nondistended. Bowel sounds present. No organomegaly or mass.  EXTREMITIES: No cyanosis, clubbing or edema b/l.    NEUROLOGIC: sedated SKIN: No obvious rash, lesion, or ulcer.   LABORATORY PANEL:   CBC Recent Labs  Lab 02/22/19 0354  WBC 7.8  HGB 11.8*  HCT 37.0*  PLT 191   ------------------------------------------------------------------------------------------------------------------ Chemistries  Recent Labs  Lab 02/20/19 0427  02/22/19 0354  NA 144   < > 143  K 3.8   < > 3.8  CL 110   < > 111  CO2 28   < > 26  GLUCOSE 140*   < > 166*  BUN 17   < > 22  CREATININE 1.16   < > 0.82  CALCIUM 8.0*   < > 8.6*  MG 2.0   < > 2.1  AST 272*  --   --   ALT 107*  --   --   ALKPHOS 51  --   --   BILITOT 1.0  --   --    < > = values in this interval not displayed.    ------------------------------------------------------------------------------------------------------------------  Cardiac Enzymes No results for input(s): TROPONINI in the last 168 hours. ------------------------------------------------------------------------------------------------------------------  RADIOLOGY:  No results found.   ASSESSMENT AND PLAN:   68 y.o. male  with pertinent past medical history of EtOH abuse, diabetes mellitus, hypertension, psoriasis, sciatica, mood disorder with suicidal ideation and tobacco abuse presenting to the ED with unresponsiveness.  1. Acute respiratory failure with hypoxia with aspiration pneumonia Primarily intubated to protect airway Continue full ventilatory support.  Appreciate ICU help.  2. Acute rhabdomyolysis with acute kidney injury Trend CK levels.   Improving Repeat labs daily  3. Elevated troponin - likely due to rhabdomyolysis -Trend troponin - Echocardiogram no wall motion abnormalities.  EF of 60%  4.   Septic shock secondary to aspiration pneumonia Levophed drip. Improved  5. Type 2 diabetes mellitus - SSI - Glycemic control per ICU protocol  6. EtOH Abuse -high risk for withdrawal CIWA protocol if needed  7.  MRI with anoxic brain injury versus metabolic derangement changes Overall poor prognosis.   Patient is off sedation and poorly responsive.  Appreciate neurology input.   CODE STATUS: FULL CODE  TOTAL TIME TAKING CARE OF THIS PATIENT: 25 minutes.   Fritzi Mandes M.D on 02/22/2019 at 2:49 PM  Between 7am to 6pm - Pager - [phone]  After 6pm go to [email or web address] - password  [facility]  [facility]  Office  [phone]  CC: Primary care physician; Dorcas Carrow, DO  Note: This dictation was prepared with Dragon dictation along with smaller phrase technology. Any transcriptional errors that result from this process are unintentional.

## 2019-02-22 NOTE — Progress Notes (Signed)
Subjective: Patient stable.  No changes.  Remains intubated.  No sedation.  Objective: Current vital signs: BP 116/82   Pulse (!) 55   Temp 99.5 F (37.5 C) (Oral)   Resp 17   Ht 6' 0.01" (1.829 m)   Wt 88.9 kg   SpO2 97%   BMI 26.57 kg/m  Vital signs in last 24 hours: Temp:  [99.5 F (37.5 C)-99.8 F (37.7 C)] 99.5 F (37.5 C) (10/27 0400) Pulse Rate:  [55-71] 55 (10/27 1400) Resp:  [16-21] 17 (10/27 1200) BP: (116-169)/(66-104) 116/82 (10/27 1400) SpO2:  [94 %-99 %] 97 % (10/27 1400) FiO2 (%):  [30 %] 30 % (10/27 1141)  Intake/Output from previous day: 10/26 0701 - 10/27 0700 In: 1735.6 [I.V.:1195.4; NG/GT:90; IV Piggyback:450.2] Out: 1360 [Urine:1360] Intake/Output this shift: Total I/O In: -  Out: 500 [Urine:500] Nutritional status:  Diet Order            Diet NPO time specified  Diet effective now              Neurologic Exam: Mental Status: Patient does not respond to verbal stimuli. Grimaces to deep sternal rub. Does not follow commands. No verbalizations noted.  Cranial Nerves: II: patient does not respond confrontation bilaterally, pupils right4m, left 378mand reactivebilaterally III,IV,VI: Oculocephalic responsepresent bilaterally. V,VII: corneal reflexpresentbilaterally VIII: patient does not respond to verbal stimuli IX,X: gag reflexunable to test, XI: trapezius strength unable to test bilaterally XII: tongue strength unable to test Motor: No upper extremity movement noted.  Moves lower extremities if soles rubbed.   Lab Results: Basic Metabolic Panel: Recent Labs  Lab 02/05/2019 2123 02/19/19 0415 02/20/19 0427 02/21/19 1247 02/22/19 0354  NA 142 140 144 143 143  K 5.6* 4.1 3.8 3.5 3.8  CL 108 110 110 110 111  CO2 20* _0 GLUCOSE 192* 213* 140* 108* 166*  BUN 36* 31* _1 CREATININE 2.73* 1.74* 1.16 0.99 0.82  CALCIUM 8.1* 7.9* 8.0* 8.3* 8.6*  MG  --  2.1 2.0 2.0 2.1  PHOS  --  3.5 1.7* 2.4* 3.7     Liver Function Tests: Recent Labs  Lab 02/21/2019 1540 02/19/19 0415 02/20/19 0427 02/21/19 1247  AST 108* 314* 272*  --   ALT 63* 117* 107*  --   ALKPHOS 81 63 51  --   BILITOT 0.9 0.8 1.0  --   PROT 7.8 6.4* 5.8*  --   ALBUMIN 4.2 3.3* 2.9* 2.9*   Recent Labs  Lab 02/20/2019 1954  LIPASE 51   Recent Labs  Lab 01/29/2019 2332  AMMONIA 27    CBC: Recent Labs  Lab 02/08/2019 1540 02/19/19 0415 02/20/19 0427 02/22/19 0354  WBC 14.7* 10.5 10.2 7.8  NEUTROABS 12.1* 7.8* 7.1  --   HGB 14.8 14.4 12.6* 11.8*  HCT 47.0 44.6 39.0 37.0*  MCV 93.1 89.2 89.7 89.4  PLT 375 250 194 191    Cardiac Enzymes: Recent Labs  Lab 02/02/2019 1540 02/19/19 0415 02/20/19 0427  CKTOTAL 7,081* 21,665* 8,417*    Lipid Panel: No results for input(s): CHOL, TRIG, HDL, CHOLHDL, VLDL, LDLCALC in the last 168 hours.  CBG: Recent Labs  Lab 02/21/19 2006 02/21/19 2345 02/22/19 0353 02/22/19 0713 02/22/19 1137  GLUCAP 115* 127* 151* 134* 155*    Microbiology: Results for orders placed or performed during the hospital encounter of 01/30/2019  SARS Coronavirus 2 by RT PCR (hospital order[facility]) Nasopharyngeal Urine, Catheterized  Status: None   Collection Time: 02/10/2019  3:40 PM   Specimen: Urine, Catheterized; Nasopharyngeal  Result Value Ref Range Status   SARS Coronavirus 2 NEGATIVE NEGATIVE Final    Comment: (NOTE) If result is NEGATIVE SARS-CoV-2 target nucleic acids are NOT DETECTED. The SARS-CoV-2 RNA is generally detectable in upper and lower  respiratory specimens during the acute phase of infection. The lowest  concentration of SARS-CoV-2 viral copies this assay can detect is 250  copies / mL. A negative result does not preclude SARS-CoV-2 infection  and should not be used as the sole basis for treatment or other  patient management decisions.  A negative result may occur with  improper specimen collection / handling, submission of  specimen other  than nasopharyngeal swab, presence of viral mutation(s) within the  areas targeted by this assay, and inadequate number of viral copies  (<250 copies / mL). A negative result must be combined with clinical  observations, patient history, and epidemiological information. If result is POSITIVE SARS-CoV-2 target nucleic acids are DETECTED. The SARS-CoV-2 RNA is generally detectable in upper and lower  respiratory specimens dur ing the acute phase of infection.  Positive  results are indicative of active infection with SARS-CoV-2.  Clinical  correlation with patient history and other diagnostic information is  necessary to determine patient infection status.  Positive results do  not rule out bacterial infection or co-infection with other viruses. If result is PRESUMPTIVE POSTIVE SARS-CoV-2 nucleic acids MAY BE PRESENT.   A presumptive positive result was obtained on the submitted specimen  and confirmed on repeat testing.  While 2019 novel coronavirus  (SARS-CoV-2) nucleic acids may be present in the submitted sample  additional confirmatory testing may be necessary for epidemiological  and / or clinical management purposes  to differentiate between  SARS-CoV-2 and other Sarbecovirus currently known to infect humans.  If clinically indicated additional testing with an alternate test  methodology [phone]) is advised. The SARS-CoV-2 RNA is generally  detectable in upper and lower respiratory sp ecimens during the acute  phase of infection. The expected result is Negative. Fact Sheet for Patients:  StrictlyIdeas.no Fact Sheet for Healthcare Providers: BankingDealers.co.za This test is not yet approved or cleared by the [place] FDA and has been authorized for detection and/or diagnosis of SARS-CoV-2 by FDA under an Emergency Use Authorization (EUA).  This EUA will remain in effect (meaning this test can be used) for the  duration of the COVID-19 declaration under Section 564(b)(1) of the Act, 21 U.S.C. section 360bbb-3(b)(1), unless the authorization is terminated or revoked sooner. Performed at [facility], [address], [place], [place] [address]   Urine Culture     Status: None   Collection Time: 02/20/2019  3:40 PM   Specimen: Urine, Random  Result Value Ref Range Status   Specimen Description   Final    URINE, RANDOM Performed at [facility], [address] [place], [place] [address]    Special Requests   Final    NONE Performed at [facility], [address], [place], [place] [address]    Culture   Final    NO GROWTH Performed at [facility], [place] [address], [place], [place] [address]    Report Status 02/20/2019 FINAL  Final  Blood culture (routine x 2)     Status: None (Preliminary result)   Collection Time: 02/17/2019  7:19 PM   Specimen: BLOOD  Result Value Ref Range Status   Specimen  Description BLOOD BLOOD LEFT HAND  Final   Special Requests   Final    BOTTLES DRAWN AEROBIC AND ANAEROBIC Blood Culture adequate volume   Culture   Final    NO GROWTH 4 DAYS Performed at [facility], [address] [place], [place] [address]    Report Status PENDING  Incomplete  Blood culture (routine x 2)     Status: None (Preliminary result)   Collection Time: 02/24/2019  7:20 PM   Specimen: BLOOD  Result Value Ref Range Status   Specimen Description BLOOD BLOOD RIGHT HAND  Final   Special Requests   Final    BOTTLES DRAWN AEROBIC AND ANAEROBIC Blood Culture adequate volume   Culture   Final    NO GROWTH 4 DAYS Performed at [facility], [address], [place], [place] [address]    Report Status PENDING  Incomplete  MRSA PCR Screening     Status: None   Collection Time: 02/22/2019  8:50 PM   Specimen: Nasal Mucosa; Nasopharyngeal  Result Value Ref Range Status   MRSA by PCR NEGATIVE NEGATIVE Final    Comment:        The GeneXpert  MRSA Assay (FDA approved for NASAL specimens only), is one component of a comprehensive MRSA colonization surveillance program. It is not intended to diagnose MRSA infection nor to guide or monitor treatment for MRSA infections. Performed at [facility], [address] [place], [place] [address]   Culture, respiratory (non-expectorated)     Status: None   Collection Time: 02/19/19  2:59 AM   Specimen: Tracheal Aspirate; Respiratory  Result Value Ref Range Status   Specimen Description   Final    TRACHEAL ASPIRATE Performed at [facility], [address] [place], [place] [address]    Special Requests   Final    NONE Performed at [facility], [address] [place], [place] [address]    Gram Stain   Final    RARE WBC PRESENT, PREDOMINANTLY PMN ABUNDANT GRAM POSITIVE COCCI RARE GRAM POSITIVE RODS    Culture   Final    MODERATE Consistent with normal respiratory flora. Performed at [facility], [place] [address], [place], [place] [address]    Report Status 02/21/2019 FINAL  Final    Coagulation Studies: No results for input(s): LABPROT, INR in the last 72 hours.  Imaging: No results found.  Medications:  I have reviewed the patient's current medications. Scheduled: . [START ON 02/23/2019] aspirin  325 mg Per Tube Daily  . chlorhexidine gluconate (MEDLINE KIT)  15 mL Mouth Rinse BID  . Chlorhexidine Gluconate Cloth  6 each Topical Q0600  . famotidine  20 mg Per Tube QHS  . feeding supplement (PRO-STAT SUGAR FREE 64)  60 mL Per Tube BID  . folic acid  1 mg Per Tube Daily  . heparin  5,000 Units Subcutaneous Q8H  . insulin aspart  0-9 Units Subcutaneous Q4H  . mouth rinse  15 mL Mouth Rinse 10 times per day  . nutrition supplement (JUVEN)  1 packet Per Tube BID BM  . potassium chloride  40 mEq Per Tube Once  . [START ON 02/23/2019] thiamine  100 mg Per Tube Daily    Assessment/Plan: 68 year old male with a history of ETOH  abuse, HTN and DM found down. Unclear period of time down. Intubated for airway management. Multiple metabolic abnormalities and in rhabdo. MRI of the brain reviewed and shows multiple areas of  diffusion weighted abnormality that are fairly symmetric and may suggest hypoperfusion injury and/or acute ischemic injury. Patient remains poorly responsive.     Recommendations: 1. EEG pending 2. Liberal BP control 3. Frequent neuro checks 4. Telemetry 5. ASA 348m rectally, daily    LOS: 4 days   LAlexis Goodell MD Neurology [phone]/27/2020  2:11 PM

## 2019-02-22 NOTE — Progress Notes (Signed)
Pharmacy Antibiotic Note  David Blackburn is a 69 y.o. male admitted on 01/27/2019 with aspiration pneumonia.  Will start Unasyn 3g IV q6h and treat a bit aggressively, if renal function worsens will decrease dose to q12h; however, patient is in AKI w/ a trending down SCr. Pharmacy has been consulted for Unasyn dosing. Plan on 7 days of therapy for Unasyn SCr: 3.11>2.73>1.74>1.16>0.82  Plan: -Continue Unasyn 3g IV q6h, day 4  -monitor renal function, s/sx of infection, and adverse effects  Height: 6' 0.01" (182.9 cm) Weight: 195 lb 15.8 oz (88.9 kg) IBW/kg (Calculated) : 77.62  Temp (24hrs), Avg:99.6 F (37.6 C), Min:99.5 F (37.5 C), Max:99.8 F (37.7 C)  Recent Labs  Lab 02/16/2019 1540 02/15/2019 1745 02/04/2019 2123 02/19/19 0415 02/20/19 0427 02/21/19 1247 02/22/19 0354  WBC 14.7*  --   --  10.5 10.2  --  7.8  CREATININE 3.11*  --  2.73* 1.74* 1.16 0.99 0.82  LATICACIDVEN 3.1* 3.2*  --   --   --   --   --     Estimated Creatinine Clearance: 97.3 mL/min (by C-G formula based on SCr of 0.82 mg/dL).    No Known Allergies  Antimicrobials this admission: Unasyn 10/24>>   Microbiology results: 10/24 Sputum: abundant GPCs and rare GPRs 10/23 BCx: pending, no growth 4 days  10/23 UCx: no growth, final 10/23 MRSA PCR: negative 10/23 COVID: negative  Thank you for allowing pharmacy to be a part of this patient's care.  Sallye Lat, PharmD Candidate 02/22/2019 11:30 AM

## 2019-02-22 NOTE — Progress Notes (Signed)
Per Lisabeth Devoid, patients son. Designated visitor will be [place] Silos [phone].

## 2019-02-22 NOTE — Progress Notes (Signed)
eeg completed ° °

## 2019-02-22 NOTE — Progress Notes (Signed)
CRITICAL CARE NOTE  CC  follow up respiratory failure  SUBJECTIVE Patient remains critically ill Prognosis is guarded Severe brain damage Very poor prognosis DNR status Very low chance of meaningful reovery    BP (!) 169/104   Pulse 62   Temp 99.5 F (37.5 C) (Oral)   Resp 18   Ht 6' 0.01" (1.829 m)   Wt 88.9 kg   SpO2 98%   BMI 26.57 kg/m    I/O last 3 completed shifts: In: 2694.8 [I.V.:1695.3; NG/GT:90; IV Piggyback:909.5] Out: 1710 [Urine:1710] No intake/output data recorded.  SpO2: 98 % FiO2 (%): 30 %     SIGNIFICANT EVENTS/STUDIES:  10/23: Pt admitted to ICU mechanically intubated  10/23: CT Head revealed no CT evidence for acute intracranial abnormality. Atrophy and small vessel ischemic changes of the white matter 10/24  Moving to sternal rub without sedation, not following verbal communication  10/25 - remains unresponsive - s/p MRI brain - extensive symmetric diffusion and signal abnormality, s/p neuro eval - significant disability expected at best case scenario.     REVIEW OF SYSTEMS  PATIENT IS UNABLE TO PROVIDE COMPLETE REVIEW OF SYSTEMS DUE TO SEVERE CRITICAL ILLNESS   PHYSICAL EXAMINATION:  GENERAL:critically ill appearing, +resp distress HEAD: Normocephalic, atraumatic.  EYES: Pupils equal, round, reactive to light.  No scleral icterus.  MOUTH: Moist mucosal membrane. NECK: Supple.  PULMONARY: +rhonchi, +wheezing CARDIOVASCULAR: S1 and S2. Regular rate and rhythm. No murmurs, rubs, or gallops.  GASTROINTESTINAL: Soft, nontender, -distended. No masses. Positive bowel sounds. No hepatosplenomegaly.  MUSCULOSKELETAL: No swelling, clubbing, or edema.  NEUROLOGIC: obtunded, GCS<8 SKIN:intact,warm,dry  MEDICATIONS: I have reviewed all medications and confirmed regimen as documented   CULTURE RESULTS   Recent Results (from the past 240 hour(s))  SARS Coronavirus 2 by RT PCR (hospital order, performed in [facility] hospital lab)  Nasopharyngeal Urine, Catheterized     Status: None   Collection Time: 01/29/2019  3:40 PM   Specimen: Urine, Catheterized; Nasopharyngeal  Result Value Ref Range Status   SARS Coronavirus 2 NEGATIVE NEGATIVE Final    Comment: (NOTE) If result is NEGATIVE SARS-CoV-2 target nucleic acids are NOT DETECTED. The SARS-CoV-2 RNA is generally detectable in upper and lower  respiratory specimens during the acute phase of infection. The lowest  concentration of SARS-CoV-2 viral copies this assay can detect is 250  copies / mL. A negative result does not preclude SARS-CoV-2 infection  and should not be used as the sole basis for treatment or other  patient management decisions.  A negative result may occur with  improper specimen collection / handling, submission of specimen other  than nasopharyngeal swab, presence of viral mutation(s) within the  areas targeted by this assay, and inadequate number of viral copies  (<250 copies / mL). A negative result must be combined with clinical  observations, patient history, and epidemiological information. If result is POSITIVE SARS-CoV-2 target nucleic acids are DETECTED. The SARS-CoV-2 RNA is generally detectable in upper and lower  respiratory specimens dur ing the acute phase of infection.  Positive  results are indicative of active infection with SARS-CoV-2.  Clinical  correlation with patient history and other diagnostic information is  necessary to determine patient infection status.  Positive results do  not rule out bacterial infection or co-infection with other viruses. If result is PRESUMPTIVE POSTIVE SARS-CoV-2 nucleic acids MAY BE PRESENT.   A presumptive positive result was obtained on the submitted specimen  and confirmed on repeat testing.  While 2019 novel coronavirus  (  SARS-CoV-2) nucleic acids may be present in the submitted sample  additional confirmatory testing may be necessary for epidemiological  and / or clinical management  purposes  to differentiate between  SARS-CoV-2 and other Sarbecovirus currently known to infect humans.  If clinically indicated additional testing with an alternate test  methodology [phone]) is advised. The SARS-CoV-2 RNA is generally  detectable in upper and lower respiratory sp ecimens during the acute  phase of infection. The expected result is Negative. Fact Sheet for Patients:  BoilerBrush.com.cy Fact Sheet for Healthcare Providers: https://pope.com/ This test is not yet approved or cleared by the [place] FDA and has been authorized for detection and/or diagnosis of SARS-CoV-2 by FDA under an Emergency Use Authorization (EUA).  This EUA will remain in effect (meaning this test can be used) for the duration of the COVID-19 declaration under Section 564(b)(1) of the Act, 21 U.S.C. section 360bbb-3(b)(1), unless the authorization is terminated or revoked sooner. Performed at [facility], [address], [place], [place] [address]   Urine Culture     Status: None   Collection Time: 02/12/2019  3:40 PM   Specimen: Urine, Random  Result Value Ref Range Status   Specimen Description   Final    URINE, RANDOM Performed at [facility], [address] [place], [place] [address]    Special Requests   Final    NONE Performed at [facility], [address], [place], [place] [address]    Culture   Final    NO GROWTH Performed at [facility], [address], [place], [place] [address]    Report Status 02/20/2019 FINAL  Final  Blood culture (routine x 2)     Status: None (Preliminary result)   Collection Time: 02/14/2019  7:19 PM   Specimen: BLOOD  Result Value Ref Range Status   Specimen Description BLOOD BLOOD LEFT HAND  Final   Special Requests   Final    BOTTLES DRAWN AEROBIC AND ANAEROBIC Blood Culture adequate volume   Culture   Final    NO GROWTH 4 DAYS Performed at [facility], [address], [place], [place] [address]    Report Status PENDING  Incomplete  Blood culture (routine x 2)     Status: None (Preliminary result)   Collection Time: 02/16/2019  7:20 PM   Specimen: BLOOD  Result Value Ref Range Status   Specimen Description BLOOD BLOOD RIGHT HAND  Final   Special Requests   Final    BOTTLES DRAWN AEROBIC AND ANAEROBIC Blood Culture adequate volume   Culture   Final    NO GROWTH 4 DAYS Performed at [facility], [address], [place], [place] [address]    Report Status PENDING  Incomplete  MRSA PCR Screening     Status: None   Collection Time: 02/23/2019  8:50 PM   Specimen: Nasal Mucosa; Nasopharyngeal  Result Value Ref Range Status   MRSA by PCR NEGATIVE NEGATIVE Final    Comment:        The GeneXpert MRSA Assay (FDA approved for NASAL specimens only), is one component of a comprehensive MRSA colonization surveillance program. It is not intended to diagnose MRSA infection nor to guide or monitor treatment for MRSA infections. Performed at [facility], [address] [place], [place] [address]   Culture, respiratory (non-expectorated)     Status: None   Collection Time: 02/19/19  2:59 AM   Specimen: Tracheal Aspirate; Respiratory  Result  Value Ref Range Status   Specimen Description   Final    TRACHEAL ASPIRATE Performed at [facility], [address] [place], [place] [address]    Special Requests   Final    NONE Performed at [facility], [address] [place], [place] [address]    Gram Stain   Final    RARE WBC PRESENT, PREDOMINANTLY PMN ABUNDANT GRAM POSITIVE COCCI RARE GRAM POSITIVE RODS    Culture   Final    MODERATE Consistent with normal respiratory flora. Performed at [facility], [address] [place], [place] [address]    Report Status 02/21/2019 FINAL  Final        CBC    Component Value Date/Time   WBC 7.8 02/22/2019 0354   RBC 4.14 (L) 02/22/2019 0354    HGB 11.8 (L) 02/22/2019 0354   HGB 15.4 08/31/2017 1507   HCT 37.0 (L) 02/22/2019 0354   HCT 45.3 08/31/2017 1507   PLT 191 02/22/2019 0354   PLT 279 08/31/2017 1507   MCV 89.4 02/22/2019 0354   MCV 88 08/31/2017 1507   MCV 88 08/04/2011 2122   MCH 28.5 02/22/2019 0354   MCHC 31.9 02/22/2019 0354   RDW 13.5 02/22/2019 0354   RDW 14.6 08/31/2017 1507   RDW 13.5 08/04/2011 2122   LYMPHSABS 1.7 02/20/2019 0427   LYMPHSABS 2.8 08/31/2017 1507   MONOABS 1.3 (H) 02/20/2019 0427   EOSABS 0.1 02/20/2019 0427   EOSABS 0.3 08/31/2017 1507   BASOSABS 0.0 02/20/2019 0427   BASOSABS 0.1 08/31/2017 1507   BMP Latest Ref Rng & Units 02/22/2019 02/21/2019 02/20/2019  Glucose 70 - 99 mg/dL 914(N166(H) 829(F108(H) 621(H140(H)  BUN 8 - 23 mg/dL 22 14 17   Creatinine 0.61 - 1.24 mg/dL 0.860.82 5.780.99 4.691.16  BUN/Creat Ratio 10 - 24 - - -  Sodium 135 - 145 mmol/L 143 143 144  Potassium 3.5 - 5.1 mmol/L 3.8 3.5 3.8  Chloride 98 - 111 mmol/L 111 110 110  CO2 22 - 32 mmol/L 26 25 28   Calcium 8.9 - 10.3 mg/dL 6.2(X8.6(L) 8.3(L) 8.0(L)      Indwelling Urinary Catheter continued, requirement due to   Reason to continue Indwelling Urinary Catheter strict Intake/Output monitoring for hemodynamic instability         Ventilator continued, requirement due to severe respiratory failure          ASSESSMENT AND PLAN SYNOPSIS 68 yo male with hx of ETOH abuse admitted with acute encephalopathy, acute renal failure with hyperkalemia, and acute respiratory failure secondary to severe metabolic acidosis and possible aspiration pneumonia requiring mechanical intubation  Evidence of brain damage from anoxia and hypotension   Severe ACUTE Hypoxic and Hypercapnic Respiratory Failure -continue Full MV support -continue Bronchodilator Therapy -Wean Fio2 and PEEP as tolerated   NEUROLOGY 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   Overall, patient is critically ill, prognosis is guarded.  Patient with Multiorgan failure and at high  risk for cardiac arrest and death.  DNR status Recommend comfort care measures  Corrin Parker, M.D.  [facility]  Medical Director [place] Director [place] Department

## 2019-02-23 ENCOUNTER — Other Ambulatory Visit: Payer: Self-pay

## 2019-02-23 DIAGNOSIS — J9601 Acute respiratory failure with hypoxia: Secondary | ICD-10-CM | POA: Diagnosis not present

## 2019-02-23 DIAGNOSIS — R4189 Other symptoms and signs involving cognitive functions and awareness: Secondary | ICD-10-CM | POA: Diagnosis not present

## 2019-02-23 DIAGNOSIS — G939 Disorder of brain, unspecified: Secondary | ICD-10-CM | POA: Diagnosis not present

## 2019-02-23 LAB — BASIC METABOLIC PANEL
Anion gap: 7 (ref 5–15)
BUN: 19 mg/dL (ref 8–23)
CO2: 25 mmol/L (ref 22–32)
Calcium: 8.7 mg/dL — ABNORMAL LOW (ref 8.9–10.3)
Chloride: 110 mmol/L (ref 98–111)
Creatinine, Ser: 0.79 mg/dL (ref 0.61–1.24)
GFR calc Af Amer: >60 mL/min (ref >60)
GFR calc non Af Amer: >60 mL/min (ref >60)
Glucose, Bld: 166 mg/dL — ABNORMAL HIGH (ref 70–99)
Potassium: 4.1 mmol/L (ref 3.5–5.1)
Sodium: 142 mmol/L (ref 135–145)

## 2019-02-23 LAB — CBC
HCT: 41.1 % (ref 39.0–52.0)
Hemoglobin: 13.3 g/dL (ref 13.0–17.0)
MCH: 28.8 pg (ref 26.0–34.0)
MCHC: 32.4 g/dL (ref 30.0–36.0)
MCV: 89 fL (ref 80.0–100.0)
Platelets: 229 10*3/uL (ref 150–400)
RBC: 4.62 MIL/uL (ref 4.22–5.81)
RDW: 13.5 % (ref 11.5–15.5)
WBC: 9.4 10*3/uL (ref 4.0–10.5)
nRBC: 0 % (ref 0.0–0.2)

## 2019-02-23 LAB — GLUCOSE, CAPILLARY
Glucose-Capillary: 136 mg/dL — ABNORMAL HIGH (ref 70–99)
Glucose-Capillary: 166 mg/dL — ABNORMAL HIGH (ref 70–99)
Glucose-Capillary: 169 mg/dL — ABNORMAL HIGH (ref 70–99)

## 2019-02-23 LAB — CULTURE, BLOOD (ROUTINE X 2)
Culture: NO GROWTH
Culture: NO GROWTH
Special Requests: ADEQUATE
Special Requests: ADEQUATE

## 2019-02-23 MED ORDER — DEXTROSE 5 % IV SOLN: INTRAVENOUS | Status: DC

## 2019-02-23 MED ORDER — ACETAMINOPHEN 650 MG RE SUPP: 650.0000 mg | Freq: Four times a day (QID) | RECTAL | Status: DC | PRN

## 2019-02-23 MED ORDER — POLYVINYL ALCOHOL 1.4 % OP SOLN: 1.0000 [drp] | Freq: Four times a day (QID) | OPHTHALMIC | Status: DC | PRN

## 2019-02-23 MED ORDER — ACETAMINOPHEN 325 MG PO TABS: 650.0000 mg | ORAL_TABLET | Freq: Four times a day (QID) | ORAL | Status: DC | PRN

## 2019-02-23 MED ORDER — MORPHINE BOLUS VIA INFUSION
5.0000 mg | INTRAVENOUS | Status: DC | PRN
  Administered 2019-02-23 (×2): 5 mg via INTRAVENOUS
  Filled 2019-02-23: qty 5

## 2019-02-23 MED ORDER — MIDAZOLAM HCL 2 MG/2ML IJ SOLN: 2.0000 mg | INTRAMUSCULAR | Status: DC | PRN

## 2019-02-23 MED ORDER — DIPHENHYDRAMINE HCL 50 MG/ML IJ SOLN: 25.0000 mg | INTRAMUSCULAR | Status: DC | PRN

## 2019-02-23 MED ORDER — MORPHINE SULFATE (PF) 2 MG/ML IV SOLN: 2.0000 mg | INTRAVENOUS | Status: DC | PRN

## 2019-02-23 MED ORDER — MORPHINE 100MG IN NS 100ML (1MG/ML) PREMIX INFUSION
0.0000 mg/h | INTRAVENOUS | Status: DC
  Administered 2019-02-23: 5 mg/h via INTRAVENOUS
  Administered 2019-02-23 – 2019-02-25 (×7): 20 mg/h via INTRAVENOUS
  Filled 2019-02-23 (×10): qty 100

## 2019-02-23 MED ORDER — GLYCOPYRROLATE 1 MG PO TABS
1.0000 mg | ORAL_TABLET | ORAL | Status: DC | PRN
  Filled 2019-02-23: qty 1

## 2019-02-23 MED ORDER — GLYCOPYRROLATE 0.2 MG/ML IJ SOLN: 0.2000 mg | INTRAMUSCULAR | Status: DC | PRN

## 2019-02-23 MED ORDER — GLYCOPYRROLATE 0.2 MG/ML IJ SOLN
0.2000 mg | INTRAMUSCULAR | Status: DC | PRN
  Administered 2019-02-23: 0.2 mg via INTRAVENOUS
  Filled 2019-02-23: qty 1

## 2019-02-23 MED ORDER — LORAZEPAM 2 MG/ML IJ SOLN
2.0000 mg | INTRAMUSCULAR | Status: DC | PRN
  Administered 2019-02-23 (×2): 4 mg via INTRAVENOUS
  Filled 2019-02-23 (×2): qty 2

## 2019-02-23 NOTE — Progress Notes (Signed)
Family At bedside, clinical status relayed to Roanna Raider who live sin [place], He does NOT plan to come to visit. He has accepted that patient is in grave prognosis and would like to proceed with comfort care measures.  Updated and notified of patients medical condition-  Progressive multiorgan failure with very low chance of meaningful recovery.  Patient is in dying  Process.  Family understands the situation.  They have consented and agreed to DNR/DNI and would like to proceed with Comfort care measures.   Family are satisfied with Plan of action and management. All questions answered  Corrin Parker, M.D.  [facility]  Medical Director [place] Director [facility] Cardio-Pulmonary Department

## 2019-02-23 NOTE — Progress Notes (Signed)
Subjective: Patient remains unresponsive.  On no sedation.  Objective: Current vital signs: BP (!) 171/100   Pulse 65   Temp 98.6 F (37 C) (Axillary)   Resp 16   Ht 6' 0.01" (1.829 m)   Wt 88.9 kg   SpO2 97%   BMI 26.57 kg/m  Vital signs in last 24 hours: Temp:  [98.6 F (37 C)-99.6 F (37.6 C)] 98.6 F (37 C) (10/28 5974) Pulse Rate:  [55-69] 65 (10/28 1100) Resp:  [16-19] 16 (10/28 1100) BP: (116-181)/(82-109) 171/100 (10/28 1100) SpO2:  [95 %-100 %] 97 % (10/28 1100) FiO2 (%):  [30 %] 30 % (10/28 0800)  Intake/Output from previous day: 10/27 0701 - 10/28 0700 In: -  Out: 1500 [Urine:1500] Intake/Output this shift: Total I/O In: 2034.4 [I.V.:1531; IV Piggyback:503.3] Out: 450 [Urine:450] Nutritional status:  Diet Order            Diet NPO time specified  Diet effective now              Neurologic Exam: Mental Status: Patient does not respond to verbal stimuli. Grimaces to deep sternal rub. Does not follow commands. No verbalizations noted.  Cranial Nerves: II: Blinks to confrontation from the right, pupils right92m, left 36mand reactivebilaterally III,IV,VI: Oculocephalic responsepresent bilaterally. V,VII: corneal reflexpresentbilaterally VIII: patient does not respond to verbal stimuli IX,X: gag reflexunable to test, XI: trapezius strength unable to test bilaterally XII: tongue strength unable to test Motor: No upper extremity movement noted.Moves lower extremities if soles rubbed.   Lab Results: Basic Metabolic Panel: Recent Labs  Lab 02/19/19 0415 02/20/19 0427 02/21/19 1247 02/22/19 0354 02/23/19 0355  NA 140 144 143 143 142  K 4.1 3.8 3.5 3.8 4.1  CL 110 110 110 111 110  CO2 '23 28 25 26 25  ' GLUCOSE 213* 140* 108* 166* 166*  BUN 31* '17 14 22 19  ' CREATININE 1.74* 1.16 0.99 0.82 0.79  CALCIUM 7.9* 8.0* 8.3* 8.6* 8.7*  MG 2.1 2.0 2.0 2.1  --   PHOS 3.5 1.7* 2.4* 3.7  --     Liver Function Tests: Recent Labs  Lab  02/22/2019 1540 02/19/19 0415 02/20/19 0427 02/21/19 1247  AST 108* 314* 272*  --   ALT 63* 117* 107*  --   ALKPHOS 81 63 51  --   BILITOT 0.9 0.8 1.0  --   PROT 7.8 6.4* 5.8*  --   ALBUMIN 4.2 3.3* 2.9* 2.9*   Recent Labs  Lab 02/15/2019 1954  LIPASE 51   Recent Labs  Lab 02/22/2019 2332  AMMONIA 27    CBC: Recent Labs  Lab 01/28/2019 1540 02/19/19 0415 02/20/19 0427 02/22/19 0354 02/23/19 0355  WBC 14.7* 10.5 10.2 7.8 9.4  NEUTROABS 12.1* 7.8* 7.1  --   --   HGB 14.8 14.4 12.6* 11.8* 13.3  HCT 47.0 44.6 39.0 37.0* 41.1  MCV 93.1 89.2 89.7 89.4 89.0  PLT 375 250 194 191 229    Cardiac Enzymes: Recent Labs  Lab 02/15/2019 1540 02/19/19 0415 02/20/19 0427  CKTOTAL 7,081* 21,665* 8,417*    Lipid Panel: No results for input(s): CHOL, TRIG, HDL, CHOLHDL, VLDL, LDLCALC in the last 168 hours.  CBG: Recent Labs  Lab 02/22/19 1623 02/22/19 2001 02/23/19 0004 02/23/19 0452 02/23/19 0738  GLUCAP 143* 128* 136* 166* 169*    Microbiology: Results for orders placed or performed during the hospital encounter of 02/08/2019  SARS Coronavirus 2 by RT PCR (hospital order[facility]) Nasopharyngeal  Urine, Catheterized     Status: None   Collection Time: 02/11/2019  3:40 PM   Specimen: Urine, Catheterized; Nasopharyngeal  Result Value Ref Range Status   SARS Coronavirus 2 NEGATIVE NEGATIVE Final    Comment: (NOTE) If result is NEGATIVE SARS-CoV-2 target nucleic acids are NOT DETECTED. The SARS-CoV-2 RNA is generally detectable in upper and lower  respiratory specimens during the acute phase of infection. The lowest  concentration of SARS-CoV-2 viral copies this assay can detect is 250  copies / mL. A negative result does not preclude SARS-CoV-2 infection  and should not be used as the sole basis for treatment or other  patient management decisions.  A negative result may occur with  improper specimen collection / handling, submission of specimen  other  than nasopharyngeal swab, presence of viral mutation(s) within the  areas targeted by this assay, and inadequate number of viral copies  (<250 copies / mL). A negative result must be combined with clinical  observations, patient history, and epidemiological information. If result is POSITIVE SARS-CoV-2 target nucleic acids are DETECTED. The SARS-CoV-2 RNA is generally detectable in upper and lower  respiratory specimens dur ing the acute phase of infection.  Positive  results are indicative of active infection with SARS-CoV-2.  Clinical  correlation with patient history and other diagnostic information is  necessary to determine patient infection status.  Positive results do  not rule out bacterial infection or co-infection with other viruses. If result is PRESUMPTIVE POSTIVE SARS-CoV-2 nucleic acids MAY BE PRESENT.   A presumptive positive result was obtained on the submitted specimen  and confirmed on repeat testing.  While 2019 novel coronavirus  (SARS-CoV-2) nucleic acids may be present in the submitted sample  additional confirmatory testing may be necessary for epidemiological  and / or clinical management purposes  to differentiate between  SARS-CoV-2 and other Sarbecovirus currently known to infect humans.  If clinically indicated additional testing with an alternate test  methodology [phone]) is advised. The SARS-CoV-2 RNA is generally  detectable in upper and lower respiratory sp ecimens during the acute  phase of infection. The expected result is Negative. Fact Sheet for Patients:  StrictlyIdeas.no Fact Sheet for Healthcare Providers: BankingDealers.co.za This test is not yet approved or cleared by the [place] FDA and has been authorized for detection and/or diagnosis of SARS-CoV-2 by FDA under an Emergency Use Authorization (EUA).  This EUA will remain in effect (meaning this test can be used) for the duration  of the COVID-19 declaration under Section 564(b)(1) of the Act, 21 U.S.C. section 360bbb-3(b)(1), unless the authorization is terminated or revoked sooner. Performed at [facility], [address] [place], [place] [address]   Urine Culture     Status: None   Collection Time: 02/11/2019  3:40 PM   Specimen: Urine, Random  Result Value Ref Range Status   Specimen Description   Final    URINE, RANDOM Performed at [facility], [address] [place], [place] [address]    Special Requests   Final    NONE Performed at [facility], [address], [place], [place] [address]    Culture   Final    NO GROWTH Performed at [facility], [place] [address], [place],  [address]    Report Status 02/20/2019 FINAL  Final  Blood culture (routine x 2)     Status: None   Collection Time: 02/17/2019  7:19 PM   Specimen: BLOOD  Result Value Ref Range  Status   Specimen Description BLOOD BLOOD LEFT HAND  Final   Special Requests   Final    BOTTLES DRAWN AEROBIC AND ANAEROBIC Blood Culture adequate volume   Culture   Final    NO GROWTH 5 DAYS Performed at [facility], [address] [place], [place] [address]    Report Status 02/23/2019 FINAL  Final  Blood culture (routine x 2)     Status: None   Collection Time: 02/01/2019  7:20 PM   Specimen: BLOOD  Result Value Ref Range Status   Specimen Description BLOOD BLOOD RIGHT HAND  Final   Special Requests   Final    BOTTLES DRAWN AEROBIC AND ANAEROBIC Blood Culture adequate volume   Culture   Final    NO GROWTH 5 DAYS Performed at [facility], [address] [place], [place] [address]    Report Status 02/23/2019 FINAL  Final  MRSA PCR Screening     Status: None   Collection Time: 02/14/2019  8:50 PM   Specimen: Nasal Mucosa; Nasopharyngeal  Result Value Ref Range Status   MRSA by PCR NEGATIVE NEGATIVE Final    Comment:        The GeneXpert MRSA Assay (FDA approved for NASAL  specimens only), is one component of a comprehensive MRSA colonization surveillance program. It is not intended to diagnose MRSA infection nor to guide or monitor treatment for MRSA infections. Performed at [facility], [address] [place], [place] [address]   Culture, respiratory (non-expectorated)     Status: None   Collection Time: 02/19/19  2:59 AM   Specimen: Tracheal Aspirate; Respiratory  Result Value Ref Range Status   Specimen Description   Final    TRACHEAL ASPIRATE Performed at [facility], [address] [place], [place] [address]    Special Requests   Final    NONE Performed at [facility], [address] [place], [place] [address]    Gram Stain   Final    RARE WBC PRESENT, PREDOMINANTLY PMN ABUNDANT GRAM POSITIVE COCCI RARE GRAM POSITIVE RODS    Culture   Final    MODERATE Consistent with normal respiratory flora. Performed at [facility], [place] [address], [place], [place] [address]    Report Status 02/21/2019 FINAL  Final    Coagulation Studies: No results for input(s): LABPROT, INR in the last 72 hours.  Imaging: No results found.  Medications:  I have reviewed the patient's current medications. Scheduled: . aspirin  325 mg Per Tube Daily  . chlorhexidine gluconate (MEDLINE KIT)  15 mL Mouth Rinse BID  . Chlorhexidine Gluconate Cloth  6 each Topical Q0600  . famotidine  20 mg Per Tube QHS  . feeding supplement (PRO-STAT SUGAR FREE 64)  60 mL Per Tube BID  . folic acid  1 mg Per Tube Daily  . heparin  5,000 Units Subcutaneous Q8H  . insulin aspart  0-9 Units Subcutaneous Q4H  . mouth rinse  15 mL Mouth Rinse 10 times per day  . nutrition supplement (JUVEN)  1 packet Per Tube BID BM  . thiamine  100 mg Per Tube Daily    Assessment/Plan: Patient remains unresponsive.  EEG slow without evidence of epileptiform activity.  Patient with poor prognosis for functional recovery.  Family has decided to proceed  with comfort care measures.   LOS: 5 days   Alexis Goodell, MD Neurology [phone] 02/23/2019  12:37 PM

## 2019-02-23 NOTE — Progress Notes (Signed)
Family At bedside, clinical status relayed to family  Updated and notified of patients medical condition-  Progressive multiorgan failure with very low chance of meaningful recovery.  Patient is in dying  Process. Patient would NOT want Trach/Peg  Family understands the situation.  They have consented and agreed to DNR/DNI and would like to proceed with Comfort care measures.   Family are satisfied with Plan of action and management. All questions answered  Corrin Parker, M.D.  [facility]  Medical Director [place] Director [facility] Cardio-Pulmonary Department

## 2019-02-23 NOTE — Procedures (Signed)
ELECTROENCEPHALOGRAM REPORT   Patient: David Blackburn       Room #: IC07A-AA EEG No. ID: [id number] Age: 68 y.o.        Sex: male Referring Physician: Posey Pronto Report Date:  02/23/2019        Interpreting Physician: Alexis Goodell  History: David Blackburn is an 68 y.o. male found down  Medications:  ASA, Unasyn, Insulin, Levophed  Conditions of Recording:  This is a 21 channel routine scalp EEG performed with bipolar and monopolar montages arranged in accordance to the international 10/20 system of electrode placement. One channel was dedicated to EKG recording.  The patient is in the intubated and unresponsive state.  Description:  The background activity is slow and poorly organized.  It consists of a low voltage, polymorphic delta activity that is diffusely distributed.  There is at times some superimposed beta activity noted as well.  This activity is persistent throughout the recording.   No epileptiform activity is noted.   Hyperventilation and intermittent photic stimulation were not performed.  IMPRESSION: This is an abnormal EEG secondary to general background slowing.  This finding may be seen with a diffuse disturbance that is etiologically nonspecific, but may include an anoxic encephalopathy, among other possibilities.  No epileptiform activity was noted.     Alexis Goodell, MD Neurology [phone] 02/23/2019, 8:08 AM

## 2019-02-23 NOTE — Progress Notes (Signed)
CRITICAL CARE NOTE  CC  Follow up resp failure  SUBJECTIVE Very low chance of meaningful recovery Patient remains critically ill Prognosis is grave     BP (!) 150/93   Pulse 60   Temp 98.6 F (37 C) (Axillary)   Resp 16   Ht 6' 0.01" (1.829 m)   Wt 88.9 kg   SpO2 96%   BMI 26.57 kg/m    I/O last 3 completed shifts: In: 749.5 [I.V.:499.5; IV Piggyback:250] Out: 2375 [Urine:2375] Total I/O In: 1934.4 [I.V.:1431.1; IV Piggyback:503.3] Out: 450 [Urine:450]  SpO2: 96 % FiO2 (%): 30 %   REVIEW OF SYSTEMS  PATIENT IS UNABLE TO PROVIDE COMPLETE REVIEW OF SYSTEM S DUE TO SEVERE CRITICAL ILLNESS AND ENCEPHALOPATHY   SIGNIFICANT EVENTS/STUDIES:  10/23: Pt admitted to ICU mechanically intubated  10/23: CT Head revealed no CT evidence for acute intracranial abnormality. Atrophy and small vessel ischemic changes of the white matter 10/24  Moving to sternal rub without sedation, not following verbal communication  10/25 - remains unresponsive - s/p MRI brain - extensive symmetric diffusion and signal abnormality, s/p neuro eval - significant disability expected at best case scenario.     PHYSICAL EXAMINATION:  GENERAL:critically ill appearing, +resp distress HEAD: Normocephalic, atraumatic.  EYES: Pupils equal, round, reactive to light.  No scleral icterus.  MOUTH: Moist mucosal membrane. NECK: Supple. No thyromegaly. No nodules. No JVD.  PULMONARY: +rhonchi, +wheezing CARDIOVASCULAR: S1 and S2. Regular rate and rhythm. No murmurs, rubs, or gallops.  GASTROINTESTINAL: Soft, nontender, -distended. Positive bowel sounds.  MUSCULOSKELETAL: No swelling, clubbing, or edema.  NEUROLOGIC: obtunded SKIN:intact,warm,dry    MEDICATIONS: I have reviewed all medications and confirmed regimen as documented   CULTURE RESULTS   Recent Results (from the past 240 hour(s))  SARS Coronavirus 2 by RT PCR (hospital order, performed in [facility] hospital lab) Nasopharyngeal  Urine, Catheterized     Status: None   Collection Time: 02/02/2019  3:40 PM   Specimen: Urine, Catheterized; Nasopharyngeal  Result Value Ref Range Status   SARS Coronavirus 2 NEGATIVE NEGATIVE Final    Comment: (NOTE) If result is NEGATIVE SARS-CoV-2 target nucleic acids are NOT DETECTED. The SARS-CoV-2 RNA is generally detectable in upper and lower  respiratory specimens during the acute phase of infection. The lowest  concentration of SARS-CoV-2 viral copies this assay can detect is 250  copies / mL. A negative result does not preclude SARS-CoV-2 infection  and should not be used as the sole basis for treatment or other  patient management decisions.  A negative result may occur with  improper specimen collection / handling, submission of specimen other  than nasopharyngeal swab, presence of viral mutation(s) within the  areas targeted by this assay, and inadequate number of viral copies  (<250 copies / mL). A negative result must be combined with clinical  observations, patient history, and epidemiological information. If result is POSITIVE SARS-CoV-2 target nucleic acids are DETECTED. The SARS-CoV-2 RNA is generally detectable in upper and lower  respiratory specimens dur ing the acute phase of infection.  Positive  results are indicative of active infection with SARS-CoV-2.  Clinical  correlation with patient history and other diagnostic information is  necessary to determine patient infection status.  Positive results do  not rule out bacterial infection or co-infection with other viruses. If result is PRESUMPTIVE POSTIVE SARS-CoV-2 nucleic acids MAY BE PRESENT.   A presumptive positive result was obtained on the submitted specimen  and confirmed on repeat testing.  While 2019  novel coronavirus  (SARS-CoV-2) nucleic acids may be present in the submitted sample  additional confirmatory testing may be necessary for epidemiological  and / or clinical management purposes  to  differentiate between  SARS-CoV-2 and other Sarbecovirus currently known to infect humans.  If clinically indicated additional testing with an alternate test  methodology [phone]) is advised. The SARS-CoV-2 RNA is generally  detectable in upper and lower respiratory sp ecimens during the acute  phase of infection. The expected result is Negative. Fact Sheet for Patients:  StrictlyIdeas.no Fact Sheet for Healthcare Providers: BankingDealers.co.za This test is not yet approved or cleared by the [place] FDA and has been authorized for detection and/or diagnosis of SARS-CoV-2 by FDA under an Emergency Use Authorization (EUA).  This EUA will remain in effect (meaning this test can be used) for the duration of the COVID-19 declaration under Section 564(b)(1) of the Act, 21 U.S.C. section 360bbb-3(b)(1), unless the authorization is terminated or revoked sooner. Performed at [facility], [address], [place], [place] [address]   Urine Culture     Status: None   Collection Time: 02/26/2019  3:40 PM   Specimen: Urine, Random  Result Value Ref Range Status   Specimen Description   Final    URINE, RANDOM Performed at [facility], [address] [place], [place] [address]    Special Requests   Final    NONE Performed at [facility], [address] [place], [place] [address]    Culture   Final    NO GROWTH Performed at [facility], [place] [address], [place], [place] [address]    Report Status 02/20/2019 FINAL  Final  Blood culture (routine x 2)     Status: None   Collection Time: 02/04/2019  7:19 PM   Specimen: BLOOD  Result Value Ref Range Status   Specimen Description BLOOD BLOOD LEFT HAND  Final   Special Requests   Final    BOTTLES DRAWN AEROBIC AND ANAEROBIC Blood Culture adequate volume   Culture   Final    NO GROWTH 5 DAYS Performed at [facility], [address]  [place], [place] [address]    Report Status 02/23/2019 FINAL  Final  Blood culture (routine x 2)     Status: None   Collection Time: 02/20/2019  7:20 PM   Specimen: BLOOD  Result Value Ref Range Status   Specimen Description BLOOD BLOOD RIGHT HAND  Final   Special Requests   Final    BOTTLES DRAWN AEROBIC AND ANAEROBIC Blood Culture adequate volume   Culture   Final    NO GROWTH 5 DAYS Performed at [facility], [address] [place], [place] [address]    Report Status 02/23/2019 FINAL  Final  MRSA PCR Screening     Status: None   Collection Time: 02/04/2019  8:50 PM   Specimen: Nasal Mucosa; Nasopharyngeal  Result Value Ref Range Status   MRSA by PCR NEGATIVE NEGATIVE Final    Comment:        The GeneXpert MRSA Assay (FDA approved for NASAL specimens only), is one component of a comprehensive MRSA colonization surveillance program. It is not intended to diagnose MRSA infection nor to guide or monitor treatment for MRSA infections. Performed at [facility], [address] [place], [place] [address]   Culture, respiratory (non-expectorated)     Status: None   Collection Time: 02/19/19  2:59 AM   Specimen: Tracheal Aspirate; Respiratory  Result Value Ref Range Status   Specimen Description   Final    TRACHEAL ASPIRATE Performed at [facility], [address] [place], [place] [address]    Special Requests   Final    NONE Performed at [facility], [address] [place], [place] [address]    Gram Stain   Final    RARE WBC PRESENT, PREDOMINANTLY PMN ABUNDANT GRAM POSITIVE COCCI RARE GRAM POSITIVE RODS    Culture   Final    MODERATE Consistent with normal respiratory flora. Performed at [facility], [address] [place], [place] [address]    Report Status 02/21/2019 FINAL  Final        CBC    Component Value Date/Time   WBC 9.4 02/23/2019 0355   RBC 4.62 02/23/2019 0355   HGB 13.3 02/23/2019 0355   HGB 15.4  08/31/2017 1507   HCT 41.1 02/23/2019 0355   HCT 45.3 08/31/2017 1507   PLT 229 02/23/2019 0355   PLT 279 08/31/2017 1507   MCV 89.0 02/23/2019 0355   MCV 88 08/31/2017 1507   MCV 88 08/04/2011 2122   MCH 28.8 02/23/2019 0355   MCHC 32.4 02/23/2019 0355   RDW 13.5 02/23/2019 0355   RDW 14.6 08/31/2017 1507   RDW 13.5 08/04/2011 2122   LYMPHSABS 1.7 02/20/2019 0427   LYMPHSABS 2.8 08/31/2017 1507   MONOABS 1.3 (H) 02/20/2019 0427   EOSABS 0.1 02/20/2019 0427   EOSABS 0.3 08/31/2017 1507   BASOSABS 0.0 02/20/2019 0427   BASOSABS 0.1 08/31/2017 1507   BMP Latest Ref Rng & Units 02/23/2019 02/22/2019 02/21/2019  Glucose 70 - 99 mg/dL 956(O166(H) 130(Q166(H) 657(Q108(H)  BUN 8 - 23 mg/dL 19 22 14   Creatinine 0.61 - 1.24 mg/dL 4.690.79 6.290.82 5.280.99  BUN/Creat Ratio 10 - 24 - - -  Sodium 135 - 145 mmol/L 142 143 143  Potassium 3.5 - 5.1 mmol/L 4.1 3.8 3.5  Chloride 98 - 111 mmol/L 110 111 110  CO2 22 - 32 mmol/L 25 26 25   Calcium 8.9 - 10.3 mg/dL 4.1(L8.7(L) 2.4(M8.6(L) 8.3(L)       ASSESSMENT AND PLAN SYNOPSIS  68 yo male with hx of ETOH abuse admitted with acute encephalopathy, acute renal failure with hyperkalemia, and acute respiratory failure secondary to severe metabolic acidosis and possible aspiration pneumonia requiring mechanical intubation  Evidence of brain damage from anoxia and hypotension  Severe ACUTE Hypoxic and Hypercapnic Respiratory Failure -continue Mechanical Ventilator support -continue Bronchodilator Therapy -Wean Fio2 and PEEP as tolerated -VAP/VENT bundle implementation   NEUROLOGY - intubated  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   Overall, patient is critically ill, prognosis is guarded.  Patient with Multiorgan failure and at high risk for cardiac arrest and death.    Lucie LeatherKurian David Machel Violante, M.D.  [facility]  Medical Director [facility] Medical Director [facility]

## 2019-02-23 NOTE — Progress Notes (Signed)
[employer] - [place] at [facility]   PATIENT NAME: David Blackburn    MR#:  [id number]  DATE OF BIRTH:  Nov 11, 1950  SUBJECTIVE:  Intubated , On full vent support Off sedation.  Poorly responsive.  REVIEW OF SYSTEMS:    Review of Systems  Unable to perform ROS: Intubated   DRUG ALLERGIES:  No Known Allergies  VITALS:  Blood pressure (!) 171/100, pulse 65, temperature 98.6 F (37 C), temperature source Axillary, resp. rate 16, height 6' 0.01" (1.829 m), weight 88.9 kg, SpO2 97 %.  PHYSICAL EXAMINATION:   Physical Examlimited exam  GENERAL:  68 y.o.-year-old patient lying in the bed.  EYES: Pupils equal, round,sluggish  reactive to light and accommodation. No scleral icterus.  HEENT: Head atraumatic, normocephalic. Oropharynx and nasopharynx clear. ETT NECK:  Supple, no jugular venous distention. No thyroid enlargement, no tenderness.  LUNGS: Normal breath sounds bilaterally, no wheezing, rales, rhonchi. No use of accessory muscles of respiration.  CARDIOVASCULAR: S1, S2 normal. No murmurs, rubs, or gallops.  ABDOMEN: Soft, nontender, nondistended. Bowel soundsfeeble. No organomegaly or mass.  EXTREMITIES: No cyanosis, clubbing or edema b/l.    NEUROLOGIC: sedated   LABORATORY PANEL:   CBC Recent Labs  Lab 02/23/19 0355  WBC 9.4  HGB 13.3  HCT 41.1  PLT 229   ------------------------------------------------------------------------------------------------------------------ Chemistries  Recent Labs  Lab 02/20/19 0427  02/22/19 0354 02/23/19 0355  NA 144   < > 143 142  K 3.8   < > 3.8 4.1  CL 110   < > 111 110  CO2 28   < > 26 25  GLUCOSE 140*   < > 166* 166*  BUN 17   < > 22 19  CREATININE 1.16   < > 0.82 0.79  CALCIUM 8.0*   < > 8.6* 8.7*  MG 2.0   < > 2.1  --   AST 272*  --   --   --   ALT 107*  --   --   --   ALKPHOS 51  --   --   --   BILITOT 1.0  --   --   --    < > = values in this interval not displayed.    ------------------------------------------------------------------------------------------------------------------  Cardiac Enzymes No results for input(s): TROPONINI in the last 168 hours. ------------------------------------------------------------------------------------------------------------------  RADIOLOGY:  No results found.   ASSESSMENT AND PLAN:   68 y.o. male  with pertinent past medical history of EtOH abuse, diabetes mellitus, hypertension, psoriasis, sciatica, mood disorder with suicidal ideation and tobacco abuse presenting to the ED with unresponsiveness.  1. MRI with anoxic brain injury versus metabolic derangement changes Overall poor prognosis.   Patient is off sedation and poorly responsive.  Appreciate neurology input--grave prognosis. No meaningful recovery expected at present. Per RN pt's brother is on the way.  2 Acute respiratory failure with hypoxia with aspiration pneumonia Primarily intubated to protect airway Continue full ventilatory support.  Appreciate ICU help.  3. Acute rhabdomyolysis with acute kidney injury Trend CK levels.    4. Elevated troponin - likely due to rhabdomyolysis -Trend troponin - Echocardiogram no wall motion abnormalities.  EF of 60%  5.Septic shock secondary to aspiration pneumonia On IV unasyn  6. Type 2 diabetes mellitus - SSI - Glycemic control per ICU protocol  7. EtOH Abuse  CIWA protocol if needed     CODE STATUS: DNR TOTAL TIME TAKING CARE OF THIS PATIENT: 25 minutes.   Enedina Finner M.D on 02/23/2019  at 11:52 AM  Between 7am to 6pm - Pager - [phone]  After 6pm go to [email or web address] - password [email or web address] [facility]  Office  [phone]  CC: Primary care physician; Valerie Roys, DO  Note: This dictation was prepared with Dragon dictation along with smaller phrase technology. Any transcriptional errors that result from this process are unintentional.

## 2019-02-23 NOTE — Progress Notes (Signed)
Extubation order written.  Patient extubated to comfort care measures @ 14:30.  RN present for extubation.

## 2019-02-23 NOTE — Progress Notes (Deleted)
Pharmacy Antibiotic Note  David Blackburn is a 68 y.o. male admitted on 02-19-19 with aspiration pneumonia.  Will start Unasyn 3g IV q6h and treat a bit aggressively, if renal function worsens will decrease dose to q12h; however, patient is in AKI w/ a trending down SCr. Pharmacy has been consulted for Unasyn dosing. Plan on 7 days of therapy for Unasyn SCr: 3.11>2.73>1.74>1.16>0.82>>0.79  Plan: -Continue Unasyn 3g IV q6h, day 5  -monitor renal function, s/sx of infection, and adverse effects  Height: 6' 0.01" (182.9 cm) Weight: 195 lb 15.8 oz (88.9 kg) IBW/kg (Calculated) : 77.62  Temp (24hrs), Avg:98.8 F (37.1 C), Min:98.3 F (36.8 C), Max:99.6 F (37.6 C)  Recent Labs  Lab 02/19/19 1540 02/19/19 1745  02/19/19 0415 02/20/19 0427 02/21/19 1247 02/22/19 0354 02/23/19 0355  WBC 14.7*  --   --  10.5 10.2  --  7.8 9.4  CREATININE 3.11*  --    < > 1.74* 1.16 0.99 0.82 0.79  LATICACIDVEN 3.1* 3.2*  --   --   --   --   --   --    < > = values in this interval not displayed.    Estimated Creatinine Clearance: 99.7 mL/min (by C-G formula based on SCr of 0.79 mg/dL).    No Known Allergies  Antimicrobials this admission: Unasyn 10/24>>   Microbiology results: 10/24 Sputum: abundant GPCs and rare GPRs 10/23 BCx: final, no growth 5 days  10/23 UCx: no growth, final 10/23 MRSA PCR: negative 10/23 COVID: negative  Thank you for allowing pharmacy to be a part of this patient's care.  Sallye Lat, PharmD Candidate 02/23/2019 10:55 AM

## 2019-02-23 NOTE — Progress Notes (Signed)
   02/23/19 1900  Clinical Encounter Type  Visited With Family  Visit Type Follow-up;Patient actively dying  Referral From Nurse  Spiritual Encounters  Spiritual Needs Prayer;Emotional;Grief support   Chaplain received a referral to support the patient's brother Gerald Stabs) during this difficult time. Upon arrival, the patient's brother was at the bedside while the patient slept soundly. The patient's brother shared that the patient went on a drinking binge recently, then was later found unresponsive. The patient's brother said that the patient has had a difficult life. He shared some of their familial history; they are two of four living siblings. The patient's brother is experiencing anticipatory grief. This chaplain and the patient engaged in conversation about Gerald Stabs' work as a Loss adjuster, chartered and the joy he gets from being with students. This chaplain offered to pray; prayer accepted. The patient's brother requested prayer for peace, the patient's son, and for the care team. This chaplain provided support in the form of active and reflective listening, compassionate ministerial presence, comfort, and prayer.

## 2019-02-23 NOTE — Patient Outreach (Signed)
[facility]) Care Management  02/23/2019  David Blackburn 03/11/1951 [id number]   Medication Adherence call to [name] HIPPA Compliant Voice message left with a call back number. [name] is showing past due on [name] under [name].   [facility] Management Direct Dial [phone]  Fax [phone] [name]

## 2019-02-24 NOTE — Care Management Important Message (Signed)
Important Message  Patient Details  Name: David Blackburn MRN: [id number] Date of Birth: 1950/11/19   Medicare Important Message Given:  Other (see comment)  Per MD prognosis is poor and placed on comfort measures yesterday. Out of respect for patient and family no IM given.  Juliann Pulse A Orion Mole 02/24/2019, 7:54 AM

## 2019-02-24 NOTE — Progress Notes (Signed)
Nutrition Brief Follow-Up Note  Chart reviewed. Patient has transitioned to comfort care.   No further nutrition interventions warranted at this time. Please re-consult RD as needed.   Normon Pettijohn, MS, RD, LDN Office: [phone] Pager: [phone] After Hours/Weekend Pager: [phone]  

## 2019-02-24 NOTE — Progress Notes (Signed)
[place] at [place] NAME: David Blackburn    MR#:  [id number]  DATE OF BIRTH:  25-Sep-1950  SUBJECTIVE:  Intubated , had terminal extubation after family changed him to comfort care Resting comfortably no family members at bedside during my examination  REVIEW OF SYSTEMS:    Review of Systems  Unable to perform ROS: Intubated   DRUG ALLERGIES:  No Known Allergies  VITALS:  Blood pressure (!) 81/51, pulse 77, temperature 100.2 F (37.9 C), temperature source Oral, resp. rate (!) 4, height 6' 0.01" (1.829 m), weight 88.9 kg, SpO2 (!) 11 %.  PHYSICAL EXAMINATION:   Physical Examlimited exam-limited as patient is on comfort care measures  GENERAL:  68 y.o.-year-old patient lying in the bed.  Resting comfortably EYES: Pupils equal, round,sluggish  reactive to light and accommodation. HEENT: Head atraumatic, normocephalic.  NECK:  Supple, no jugular venous distention.  LUNGS: Diminished breath sounds no wheezing, rales, rhonchi. No use of accessory muscles of respiration.  CARDIOVASCULAR: S1, S2 normal. No murmurs, rubs, or gallops.  ABDOMEN: Soft, nontender, nondistended. Bowel soundsfeeble. NEUROLOGIC: On morphine drip   LABORATORY PANEL:   CBC Recent Labs  Lab 02/23/19 0355  WBC 9.4  HGB 13.3  HCT 41.1  PLT 229   ------------------------------------------------------------------------------------------------------------------ Chemistries  Recent Labs  Lab 02/20/19 0427  02/22/19 0354 02/23/19 0355  NA 144   < > 143 142  K 3.8   < > 3.8 4.1  CL 110   < > 111 110  CO2 28   < > 26 25  GLUCOSE 140*   < > 166* 166*  BUN 17   < > 22 19  CREATININE 1.16   < > 0.82 0.79  CALCIUM 8.0*   < > 8.6* 8.7*  MG 2.0   < > 2.1  --   AST 272*  --   --   --   ALT 107*  --   --   --   ALKPHOS 51  --   --   --   BILITOT 1.0  --   --   --    < > = values in this interval not displayed.    ------------------------------------------------------------------------------------------------------------------  Cardiac Enzymes No results for input(s): TROPONINI in the last 168 hours. ------------------------------------------------------------------------------------------------------------------  RADIOLOGY:  No results found.   ASSESSMENT AND PLAN:   68 y.o. male  with pertinent past medical history of EtOH abuse, diabetes mellitus, hypertension, psoriasis, sciatica, mood disorder with suicidal ideation and tobacco abuse presenting to the ED with unresponsiveness.  1. MRI with anoxic brain injury versus metabolic derangement changes Overall poor prognosis.   Marland Kitchen  Appreciate neurology input--grave prognosis. No meaningful recovery expected at present. Family made him comfort care and terminal extubation is done  2 Acute respiratory failure with hypoxia with aspiration pneumonia Primarily intubated to protect airway Status post terminal extubation after family requested comfort care  3. Acute rhabdomyolysis with acute kidney injury     4. Elevated troponin - likely due to rhabdomyolysis -- Echocardiogram no wall motion abnormalities.  EF of 60%  5.Septic shock secondary to aspiration pneumonia On IV unasyn  6. Type 2 diabetes mellitus -  7. EtOH Abuse       CODE STATUS: DNR TOTAL TIME TAKING CARE OF THIS PATIENT: 23  minutes.   Nicholes Mango M.D on 02/24/2019 at 4:44 PM  Between 7am to 6pm - Pager - [phone]   After 6pm go to [email or web address] - password  [email or web address]  [facility]  Office  [phone]  CC: Primary care physician; Dorcas Carrow, DO  Note: This dictation was prepared with Dragon dictation along with smaller phrase technology. Any transcriptional errors that result from this process are unintentional.

## 2019-02-27 NOTE — Progress Notes (Signed)
Spoke with son Larkin Ina , informed him father passed peacefully at 9:40 am, apparently his brother chris who has promised to call him had not let him know yet, he was very nice , confirmed funeral home, no further questions

## 2019-02-27 NOTE — Progress Notes (Signed)
Ch received pg to attend to the family of the pt that has passed. Ch provided social support and prayed with pt family at bedside. Ch will hv f/u care provided for pt family.    02/19/2019 1100  Clinical Encounter Type  Visited With Family  Visit Type Follow-up;Spiritual support;Death  Referral From Chaplain;Nurse  Consult/Referral To Chaplain  Spiritual Encounters  Spiritual Needs Grief support  Stress Factors  Patient Stress Factors Health changes;Loss of control  Family Stress Factors Loss of control;Loss;Major life changes

## 2019-02-27 NOTE — Progress Notes (Signed)
   03/02/19 1100  Clinical Encounter Type  Visited With Family  Visit Type Follow-up;Spiritual support;Death  Referral From Chaplain  Consult/Referral To Chaplain  Spiritual Encounters  Spiritual Needs Grief support  Chaplain visit with brother(Chris)  for comfort in the loss of his brother the patient. Gerald Stabs express he being the last one left and shared the death of the other siblings. Chaplain presented active listening and words of comfort to [place] and he was so appreciative for Chaplain being with him even doing Covid.

## 2019-02-27 NOTE — Progress Notes (Signed)
Patient passed away peacefully at 9:40 am , brother Gerald Stabs in room, A/C contacted and she will call [place] [facility], MD called and brother is to contact son.

## 2019-02-27 DEATH — deceased

## 2019-03-14 ENCOUNTER — Telehealth: Payer: Self-pay

## 2019-03-16 ENCOUNTER — Telehealth: Payer: Self-pay

## 2019-04-12 ENCOUNTER — Telehealth: Payer: Self-pay | Admitting: *Deleted

## 2019-04-29 NOTE — Death Summary Note (Signed)
  DEATH SUMMARY   Patient Details  Name: [name] MRN: [id number] DOB: 1950/05/14  Admission/Discharge Information   Admit Date:  Feb 24, 2019  Date of Death: Date of Death: 2019-02-24  Time of Death: Time of Death: 0940  Length of Stay: 7  Referring Physician: Valerie Roys, DO   Reason(s) for Hospitalization  unresponsiveness Diagnoses  Preliminary cause of death: anoxic brain injury Secondary Diagnoses (including complications and co-morbidities):  Active Problems:   Unresponsiveness   Rhabdomyolysis   Pressure injury of skin   Brief Hospital Course (including significant findings, care, treatment, and services provided and events leading to death)  David Blackburn is a 69 y.o. with past medical history ofEtOH abuse, diabetes mellitus, hypertension, psoriasis, sciatica, mood disorder with suicidal ideation and tobacco abuse admitted for unresponsiveness.  1.Anoxic brain injury   2 Acuterespiratory failure with hypoxia with aspiration pneumonia  3.Acute rhabdomyolysis with acute kidney injury    4.Elevated troponin - due to rhabdomyolysis --Echocardiogram no wall motion abnormalities. EF of 60%  5.Septic shock secondary to aspiration pneumonia  6.Type 2 diabetes mellitus  7.EtOHAbuse   Please note this dictation is done on behalf of Dr Illene Silver Gouru who is no longer with practice.  Cinthya Bors Manuella Ghazi 03/29/2019, 5:13 PM

## 2019-05-18 ENCOUNTER — Other Ambulatory Visit: Payer: Self-pay

## 2021-05-05 IMAGING — US US RENAL
1 series · 14 of 25 positions shown · non-contrast
Comparison: None.

CLINICAL DATA: Acute renal failure.

EXAM:
RENAL / URINARY TRACT ULTRASOUND COMPLETE

[Series 1: us renal · 14 of 31 slices shown]
[im 1/31]
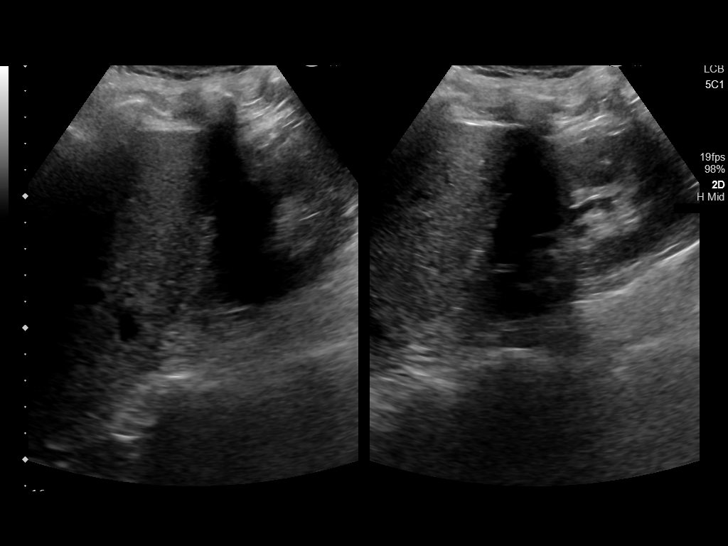
[im 3/31]
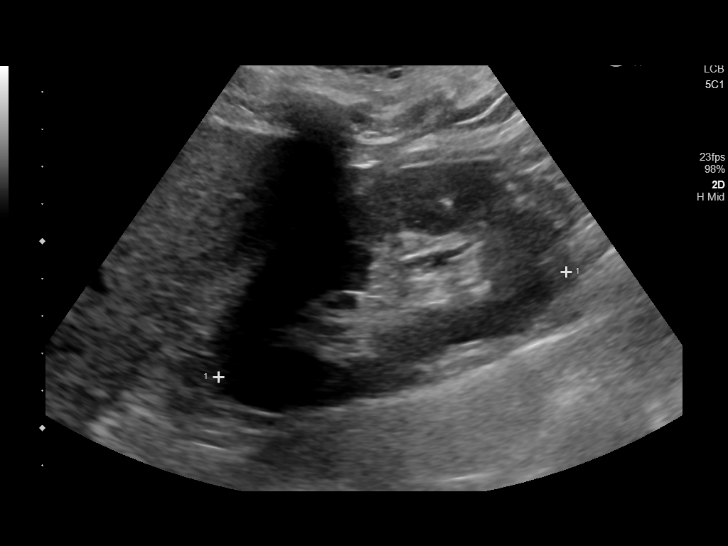
[im 6/31]
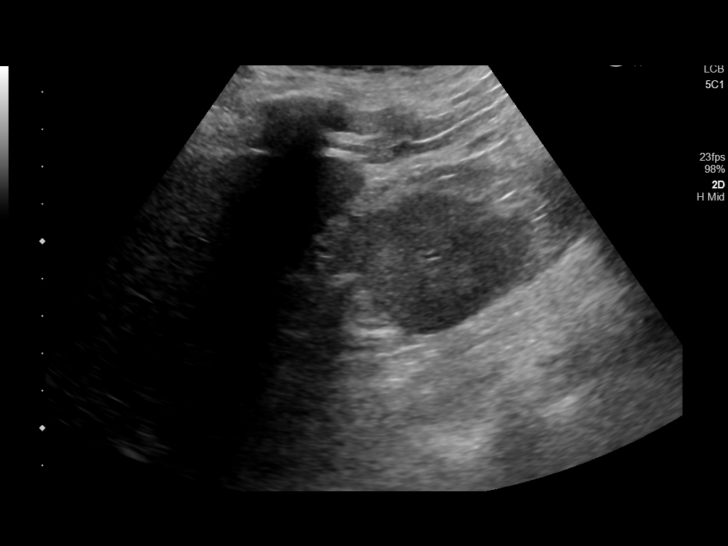
[im 8/31]
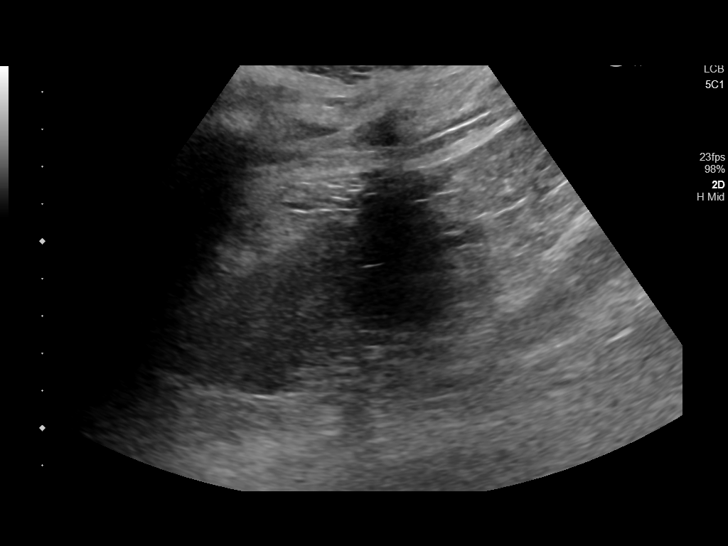
[im 11/31]
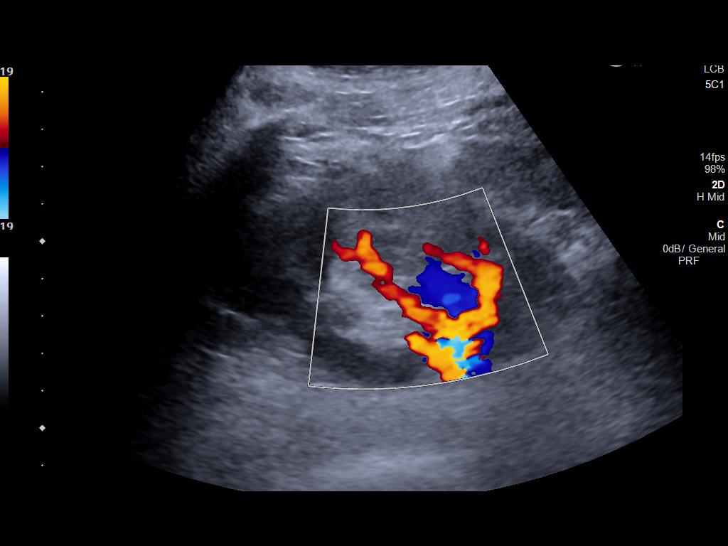
[im 12/31]
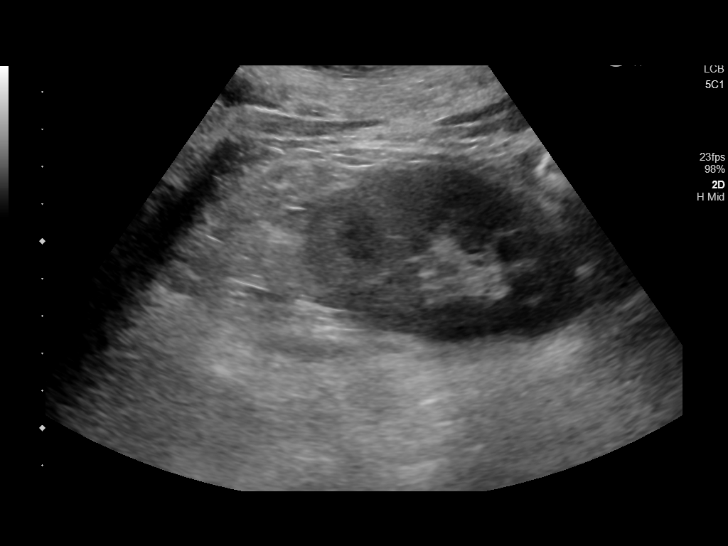
[im 14/31]
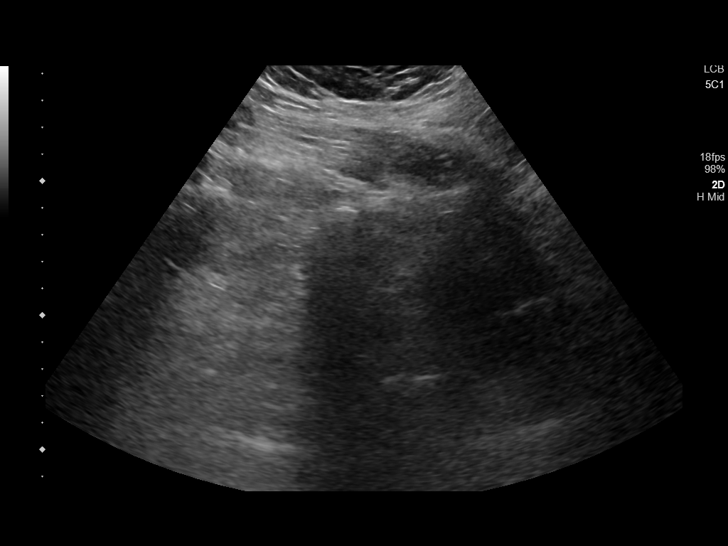
[im 17/31]
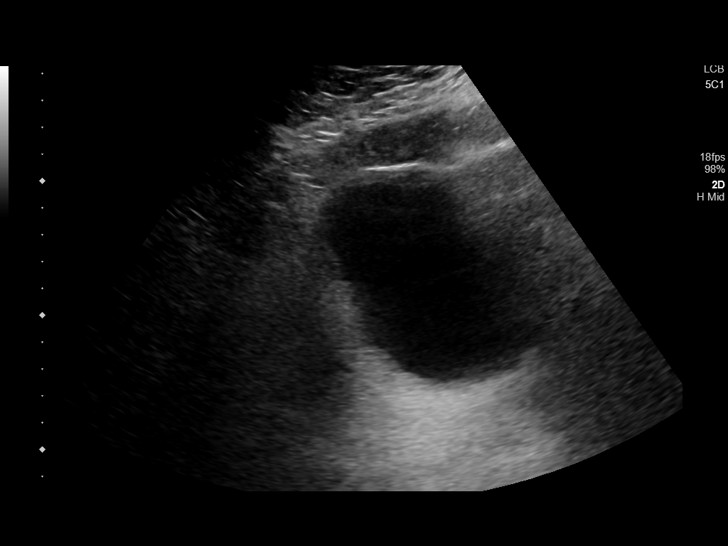
[im 19/31]
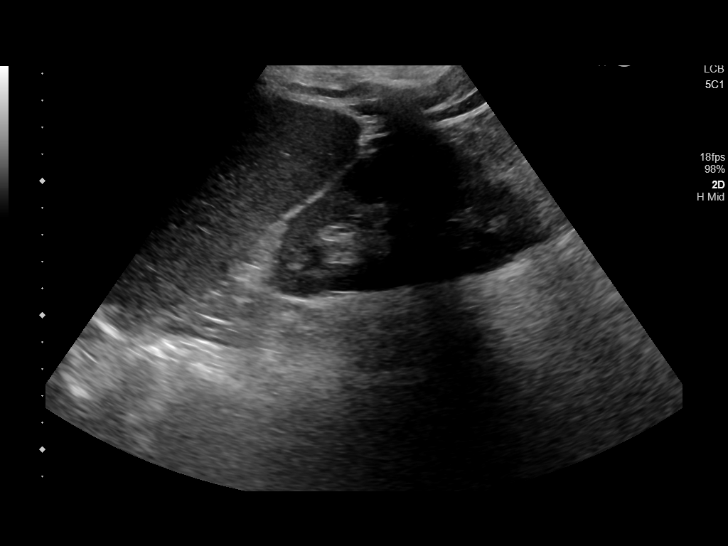
[im 21/31]
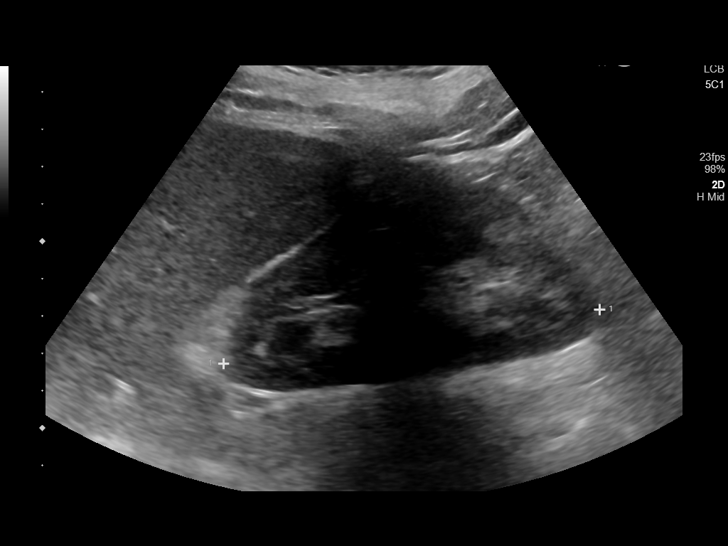
[im 23/31]
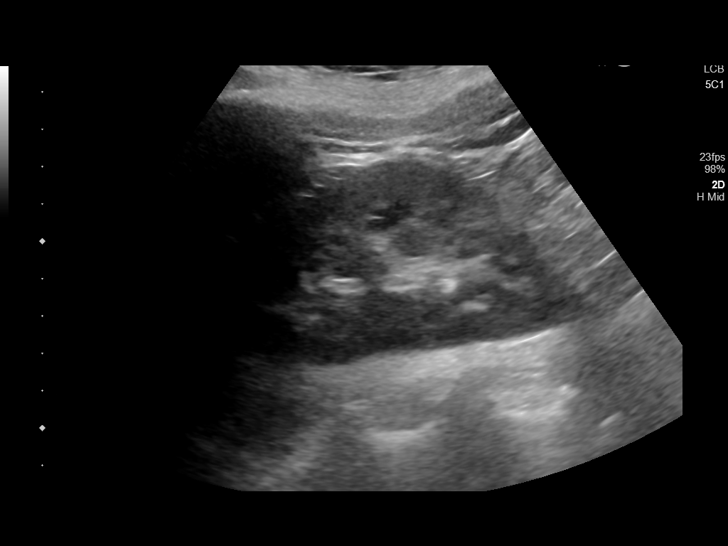
[im 26/31]
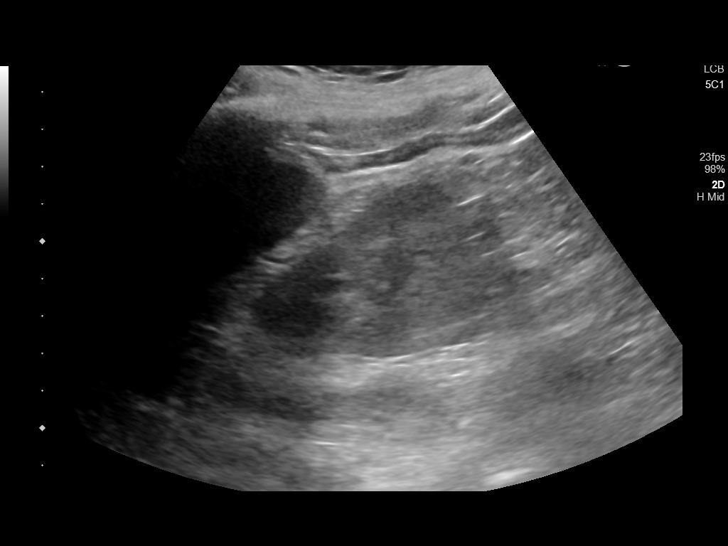
[im 28/31]
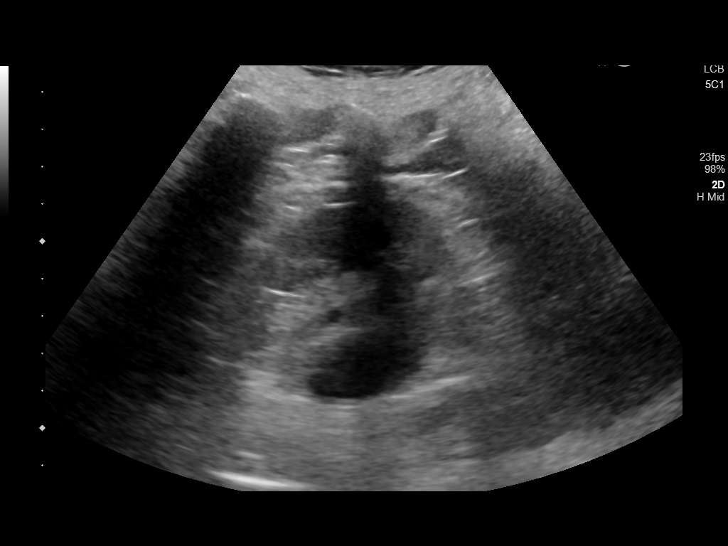
[im 31/31]
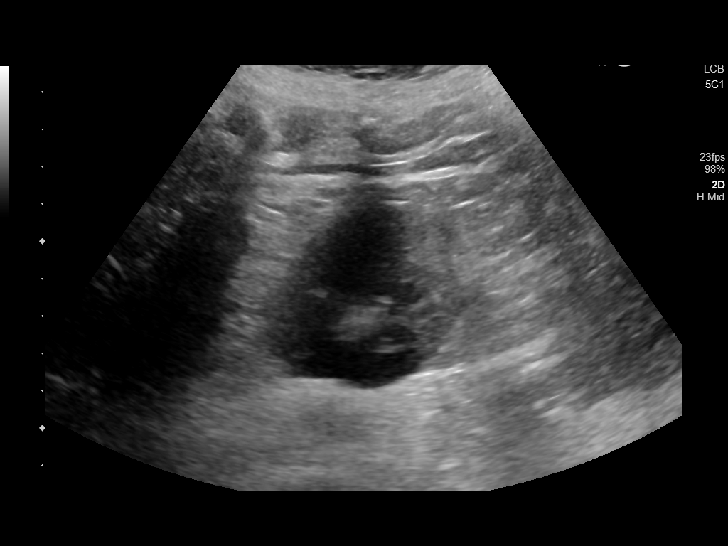

[14 of 25 positions shown; findings below may reference images not displayed]

FINDINGS: Right Kidney:

Renal measurements: 9.7 x 5.8 x 5.7 cm = volume: 170 mL .
Echogenicity within normal limits. No mass or hydronephrosis
visualized.

Left Kidney:

Renal measurements: 10.1 x 5.0 x 5.1 cm = volume: 137 mL.
Echogenicity within normal limits. No mass or hydronephrosis
visualized.

Bladder:

Foley catheter within a partially decompressed bladder.

Other:

None.
IMPRESSION: Normal renal ultrasound.

## 2021-05-05 IMAGING — DX DG ABDOMEN 1V
2 series · 2 of 2 positions shown · non-contrast
Comparison: None.

CLINICAL DATA: Nasogastric tube placement

EXAM:
ABDOMEN - 1 VIEW

[abdomen supine (1 of 2)]
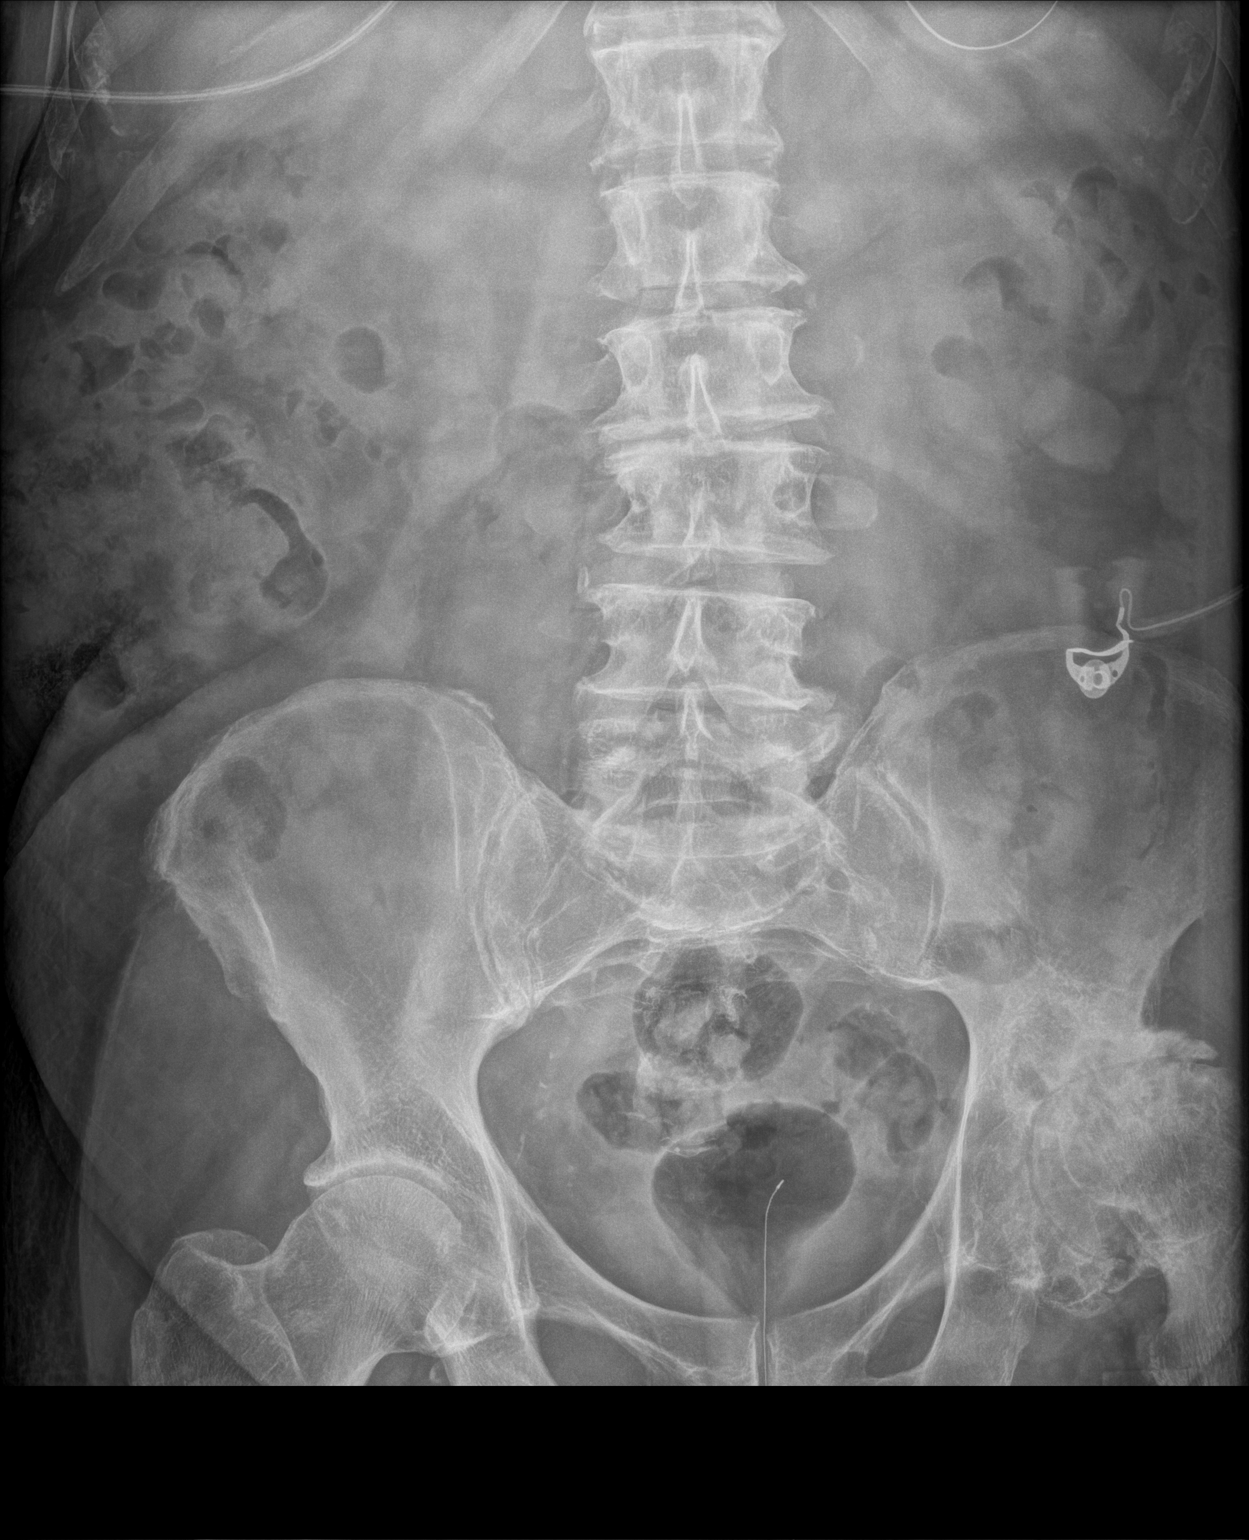

[abdomen supine (2 of 2)]
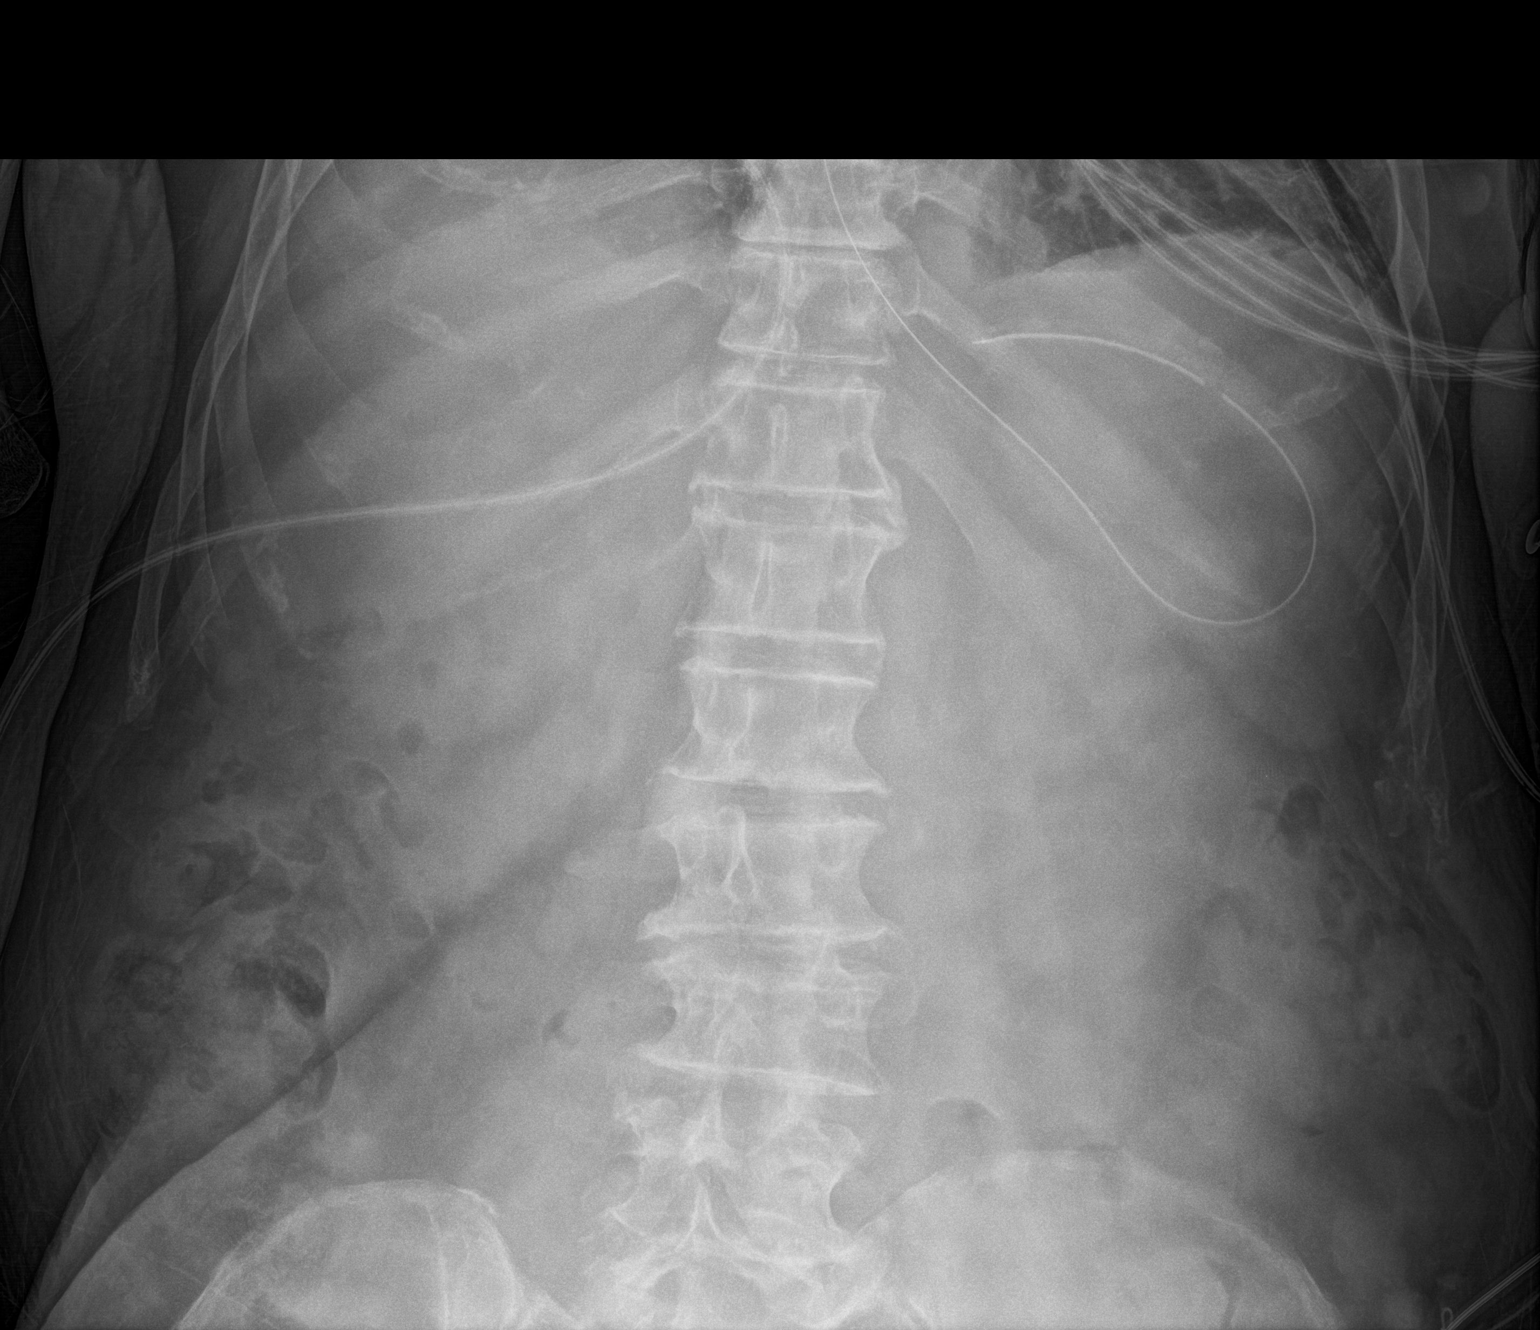

[2 of 2 positions shown; findings below may reference images not displayed]

FINDINGS: Nasogastric tube tip and side port are in the stomach. There is
moderate stool in the colon. There is no bowel dilatation or
air-fluid level to suggest bowel obstruction. No free air. There is
degenerative change in the lumbar spine. There is advanced
arthropathy in the left hip joint with avascular necrosis in the
left femoral head region. There is iliac artery atherosclerosis,
primarily on the right.
IMPRESSION: Nasogastric tube tip and side port in stomach. No bowel obstruction
or free air. Advanced arthropathy in the left hip joint with
avascular necrosis in the left femoral head.
